# Patient Record
Sex: Male | Born: 1985 | Race: Black or African American | Hispanic: No | Marital: Single | Smoking: Never smoker
Health system: Southern US, Community
[De-identification: ages and names within clinical notes are randomized; demographics above are authoritative.]

## PROBLEM LIST (undated history)

## (undated) DIAGNOSIS — M6282 Rhabdomyolysis: Secondary | ICD-10-CM

## (undated) DIAGNOSIS — F23 Brief psychotic disorder: Secondary | ICD-10-CM

## (undated) DIAGNOSIS — F419 Anxiety disorder, unspecified: Secondary | ICD-10-CM

## (undated) HISTORY — DX: Rhabdomyolysis: M62.82

## (undated) HISTORY — DX: Brief psychotic disorder: F23

---

## 2013-01-10 ENCOUNTER — Ambulatory Visit: Payer: Self-pay | Admitting: Family Medicine

## 2015-09-05 ENCOUNTER — Ambulatory Visit (INDEPENDENT_AMBULATORY_CARE_PROVIDER_SITE_OTHER): Payer: Self-pay | Admitting: Family Medicine

## 2015-09-05 ENCOUNTER — Encounter: Payer: Self-pay | Admitting: Family Medicine

## 2015-09-05 VITALS — BP 140/82 | HR 73 | Temp 100.0°F | Resp 14 | Ht 60.0 in | Wt 170.0 lb

## 2015-09-05 DIAGNOSIS — R410 Disorientation, unspecified: Secondary | ICD-10-CM

## 2015-09-05 DIAGNOSIS — R509 Fever, unspecified: Secondary | ICD-10-CM

## 2015-09-05 DIAGNOSIS — B999 Unspecified infectious disease: Secondary | ICD-10-CM

## 2015-09-05 LAB — CBC WITH DIFFERENTIAL/PLATELET
BASOS ABS: 0 {cells}/uL (ref 0–200)
BASOS PCT: 0 %
EOS PCT: 0 %
Eosinophils Absolute: 0 cells/uL — ABNORMAL LOW (ref 15–500)
HCT: 37.4 % — ABNORMAL LOW (ref 38.5–50.0)
HEMOGLOBIN: 12.9 g/dL — AB (ref 13.2–17.1)
Lymphocytes Relative: 13 %
Lymphs Abs: 949 cells/uL (ref 850–3900)
MCH: 26.9 pg — AB (ref 27.0–33.0)
MCHC: 34.5 g/dL (ref 32.0–36.0)
MCV: 78.1 fL — AB (ref 80.0–100.0)
MONOS PCT: 23 %
MPV: 10.5 fL (ref 7.5–12.5)
Monocytes Absolute: 1679 cells/uL — ABNORMAL HIGH (ref 200–950)
NEUTROS PCT: 64 %
Neutro Abs: 4672 cells/uL (ref 1500–7800)
PLATELETS: 185 10*3/uL (ref 140–400)
RBC: 4.79 MIL/uL (ref 4.20–5.80)
RDW: 12.8 % (ref 11.0–15.0)
WBC: 7.3 10*3/uL (ref 3.8–10.8)

## 2015-09-05 MED ORDER — LORAZEPAM 0.5 MG PO TABS: 0.2500 mg | ORAL_TABLET | Freq: Four times a day (QID) | ORAL | Status: DC | PRN

## 2015-09-05 MED ORDER — CIPROFLOXACIN HCL 500 MG PO TABS: 500.0000 mg | ORAL_TABLET | Freq: Two times a day (BID) | ORAL | Status: DC

## 2015-09-05 NOTE — Patient Instructions (Addendum)
Start antibiotic and take one every twelve hours  Please do eat yogurt daily or take a probiotic daily for the next month or two We want to replace the healthy germs in the gut If you notice foul, watery diarrhea in the next two months, schedule an appointment RIGHT AWAY  We'll get a scan of the abdomen and pelvis tomorrow If you get worse overnight, back to the ER Use tylenol and/or ibuprofen if needed If any other issues, contact on-call doctor or seek treatment immediately

## 2015-09-05 NOTE — Progress Notes (Signed)
BP 140/82   Pulse 73   Temp 100 F (37.8 C) (Oral)   Resp 14   Ht 5' (1.524 m)   Wt 170 lb (77.1 kg)   SpO2 98%   BMI 33.20 kg/m   Temp 98.2 to 100 degrees  Subjective:    Patient ID: Louis Newton, male    DOB: [birth year], 30 y.o.   MRN: [id number]  HPI: Louis Newton is a 30 y.o. male  Chief Complaint  Patient presents with  . Follow-up    from fever states has not slept in 5 days   He was in his usual state of health until Saturday, worked in the heat, fever Sat night Went to [place] on Sunday; then to [facility] last Sunday night, gave him fluids and they sent him home Monday back down there, did spinal tap, pumped him full of fluids; took  Really bad anxiety, stressing Gave him tylenol and motrin at [facility] and that broke the fever Temperature was 101.7 last night, back to [facility] last night Body aches and chest pains No travel but stepfather just returned from [facility], stepfather, vomiting and fever; [place] No cough, no burning with urination; no trouble urinating Kind of numb down below, can't get an erection, just days; never happened before; no redness or swelling No change in BM We reviewed labs; CBC showed left shift; ketones in urine; blood and white cells in urine Father had kidney disease; mass on the kidney He has been hearing voices since he has been sick No hx of depression; no fam hx of bipolar disorder He has not slept for 4 nights, except for about 3 hours last night; when febrile, he thinks that he is Jesus; he has a white truck and keeps it clean; he can see the Bible; He is using me to do that; what is going on with Daisy Floro, things don't change, He's coming back He talked about unity and black and white No special powers He said that he could see girlfriend's mother talking to him, but he'd never met her He had neck stiffness the first night, but not since then  Depression screen PHQ 2/9 09/05/2015  Decreased Interest 0    Down, Depressed, Hopeless 1  PHQ - 2 Score 1   Relevant past medical, surgical, family and social history reviewed No past medical history on file.   No past surgical history on file.   Family History  Problem Relation Age of Onset  . Kidney disease Father   . Hypertension Daughter   . Epilepsy Daughter    Social History  Substance Use Topics  . Smoking status: Never Smoker  . Smokeless tobacco: Never Used  . Alcohol use 0.0 oz/week     Comment: today, 1 beer   Interim medical history since last visit reviewed. Allergies and medications reviewed  Review of Systems Per HPI unless specifically indicated above     Objective:    BP 140/82   Pulse 73   Temp 100 F (37.8 C) (Oral)   Resp 14   Ht 5' (1.524 m)   Wt 170 lb (77.1 kg)   SpO2 98%   BMI 33.20 kg/m     Physical Exam  Constitutional: He appears well-developed and well-nourished. No distress.  HENT:  Head: Normocephalic and atraumatic.  Eyes: EOM are normal. No scleral icterus.  Neck: No thyromegaly present.  Cardiovascular: Normal rate and regular rhythm.   Pulmonary/Chest: Effort normal  and breath sounds normal.  Abdominal: Soft. Bowel sounds are normal. He exhibits no distension. There is tenderness.  Musculoskeletal: He exhibits no edema.  Neurological: Coordination normal.  Skin: Skin is warm and dry. No pallor.  Psychiatric: He has a normal mood and affect. His behavior is normal. Judgment and thought content normal. His mood appears not anxious. His affect is not blunt, not labile and not inappropriate. His speech is not rapid and/or pressured, not tangential and not slurred. He is not agitated, not aggressive, not hyperactive, not slowed, not withdrawn and not actively hallucinating. Cognition and memory are not impaired. He does not express impulsivity or inappropriate judgment. He does not exhibit a depressed mood. He expresses no homicidal and no suicidal ideation.  Patient gives good history today       Assessment & Plan:   Problem List Items Addressed This Visit    None    Visit Diagnoses    Fever and chills    -  Primary   reviewed extensive work-up from outside tertiary care center; will get labs, CT scan; to ER if worsening   Relevant Orders   Urinalysis w microscopic + reflex cultur (Completed)   CBC with Differential/Platelet (Completed)   CT Abdomen Pelvis W Contrast   Confusion associated with infection       confusion with fever, sounds like delirium; no fam hx of depression, schizophrenia, bipolar d/o; will start antibiotics, get labs and CT; to ER if worse      Follow up plan: No Follow-up on file.  An after-visit summary was printed and given to the patient at [facility].  Please see the patient instructions which may contain other information and recommendations beyond what is mentioned above in the assessment and plan.  Orders Placed This Encounter  Procedures  . CT Abdomen Pelvis W Contrast  . Urinalysis w microscopic + reflex cultur  . CBC with Differential/Platelet  Face-to-face time with patient was more than 25 minutes, >50% time spent counseling and coordination of care

## 2015-09-06 ENCOUNTER — Inpatient Hospital Stay
Admission: EM | Admit: 2015-09-06 | Discharge: 2015-09-10 | DRG: 558 | Disposition: A | Discharge status: Other Acute Inpatient Hospital | Payer: Self-pay | Attending: Internal Medicine | Admitting: Internal Medicine

## 2015-09-06 ENCOUNTER — Encounter: Payer: Self-pay | Admitting: *Deleted

## 2015-09-06 DIAGNOSIS — F23 Brief psychotic disorder: Secondary | ICD-10-CM | POA: Diagnosis present

## 2015-09-06 DIAGNOSIS — E876 Hypokalemia: Secondary | ICD-10-CM | POA: Diagnosis present

## 2015-09-06 DIAGNOSIS — R45851 Suicidal ideations: Secondary | ICD-10-CM | POA: Diagnosis present

## 2015-09-06 DIAGNOSIS — M6282 Rhabdomyolysis: Principal | ICD-10-CM | POA: Diagnosis present

## 2015-09-06 DIAGNOSIS — Z8739 Personal history of other diseases of the musculoskeletal system and connective tissue: Secondary | ICD-10-CM | POA: Diagnosis present

## 2015-09-06 DIAGNOSIS — Z820 Family history of epilepsy and other diseases of the nervous system: Secondary | ICD-10-CM

## 2015-09-06 DIAGNOSIS — R7989 Other specified abnormal findings of blood chemistry: Secondary | ICD-10-CM

## 2015-09-06 DIAGNOSIS — R778 Other specified abnormalities of plasma proteins: Secondary | ICD-10-CM

## 2015-09-06 DIAGNOSIS — Z841 Family history of disorders of kidney and ureter: Secondary | ICD-10-CM

## 2015-09-06 DIAGNOSIS — Z046 Encounter for general psychiatric examination, requested by authority: Secondary | ICD-10-CM

## 2015-09-06 DIAGNOSIS — F329 Major depressive disorder, single episode, unspecified: Secondary | ICD-10-CM

## 2015-09-06 DIAGNOSIS — F32A Depression, unspecified: Secondary | ICD-10-CM

## 2015-09-06 DIAGNOSIS — Z79899 Other long term (current) drug therapy: Secondary | ICD-10-CM

## 2015-09-06 DIAGNOSIS — N179 Acute kidney failure, unspecified: Secondary | ICD-10-CM | POA: Diagnosis present

## 2015-09-06 DIAGNOSIS — N390 Urinary tract infection, site not specified: Secondary | ICD-10-CM | POA: Diagnosis present

## 2015-09-06 DIAGNOSIS — Z8249 Family history of ischemic heart disease and other diseases of the circulatory system: Secondary | ICD-10-CM

## 2015-09-06 LAB — COMPREHENSIVE METABOLIC PANEL
ALK PHOS: 67 U/L (ref 38–126)
ALT: 62 U/L (ref 17–63)
AST: 135 U/L — ABNORMAL HIGH (ref 15–41)
Albumin: 4 g/dL (ref 3.5–5.0)
Anion gap: 11 (ref 5–15)
BILIRUBIN TOTAL: 0.8 mg/dL (ref 0.3–1.2)
BUN: 12 mg/dL (ref 6–20)
CALCIUM: 9.8 mg/dL (ref 8.9–10.3)
CHLORIDE: 102 mmol/L (ref 101–111)
CO2: 28 mmol/L (ref 22–32)
CREATININE: 1.28 mg/dL — AB (ref 0.61–1.24)
Glucose, Bld: 110 mg/dL — ABNORMAL HIGH (ref 65–99)
Potassium: 3.5 mmol/L (ref 3.5–5.1)
Sodium: 141 mmol/L (ref 135–145)
TOTAL PROTEIN: 8.3 g/dL — AB (ref 6.5–8.1)

## 2015-09-06 LAB — URINALYSIS W MICROSCOPIC + REFLEX CULTURE
BACTERIA UA: NONE SEEN [HPF]
BILIRUBIN URINE: NEGATIVE
Casts: NONE SEEN [LPF]
Crystals: NONE SEEN [HPF]
GLUCOSE, UA: NEGATIVE
Ketones, ur: NEGATIVE
LEUKOCYTES UA: NEGATIVE
Nitrite: NEGATIVE
SQUAMOUS EPITHELIAL / LPF: NONE SEEN [HPF] (ref <=5)
Specific Gravity, Urine: 1.01 (ref 1.001–1.035)
WBC UA: NONE SEEN WBC/HPF (ref <=5)
Yeast: NONE SEEN [HPF]
pH: 7.5 (ref 5.0–8.0)

## 2015-09-06 LAB — CBC
HCT: 41.7 % (ref 40.0–52.0)
Hemoglobin: 14.5 g/dL (ref 13.0–18.0)
MCH: 27.8 pg (ref 26.0–34.0)
MCHC: 34.8 g/dL (ref 32.0–36.0)
MCV: 79.9 fL — AB (ref 80.0–100.0)
PLATELETS: 200 10*3/uL (ref 150–440)
RBC: 5.21 MIL/uL (ref 4.40–5.90)
RDW: 12.5 % (ref 11.5–14.5)
WBC: 11.5 10*3/uL — ABNORMAL HIGH (ref 3.8–10.6)

## 2015-09-06 LAB — SALICYLATE LEVEL

## 2015-09-06 LAB — ETHANOL

## 2015-09-06 LAB — ACETAMINOPHEN LEVEL: Acetaminophen (Tylenol), Serum: <10 ug/mL — ABNORMAL LOW (ref 10–30)

## 2015-09-06 NOTE — ED Notes (Signed)
Pt is very agitated physically, constantly moving. Pt presents w/ [place] police under IVC from family. Pt denies SI/HI/self-harm. Pt acknowledges that he made statements indicative of SI, denies plan at this time. Pt admits to ETOH use today, denies drug use.

## 2015-09-07 DIAGNOSIS — Z046 Encounter for general psychiatric examination, requested by authority: Secondary | ICD-10-CM

## 2015-09-07 DIAGNOSIS — F23 Brief psychotic disorder: Secondary | ICD-10-CM

## 2015-09-07 DIAGNOSIS — Z8739 Personal history of other diseases of the musculoskeletal system and connective tissue: Secondary | ICD-10-CM | POA: Diagnosis present

## 2015-09-07 DIAGNOSIS — M6282 Rhabdomyolysis: Secondary | ICD-10-CM

## 2015-09-07 HISTORY — DX: Rhabdomyolysis: M62.82

## 2015-09-07 HISTORY — DX: Encounter for general psychiatric examination, requested by authority: Z04.6

## 2015-09-07 HISTORY — DX: Brief psychotic disorder: F23

## 2015-09-07 HISTORY — DX: Personal history of other diseases of the musculoskeletal system and connective tissue: Z87.39

## 2015-09-07 LAB — PHOSPHORUS: PHOSPHORUS: 3 mg/dL (ref 2.5–4.6)

## 2015-09-07 LAB — URINE DRUG SCREEN, QUALITATIVE (ARMC ONLY)
AMPHETAMINES, UR SCREEN: NOT DETECTED
Barbiturates, Ur Screen: NOT DETECTED
Benzodiazepine, Ur Scrn: NOT DETECTED
COCAINE METABOLITE, UR ~~LOC~~: NOT DETECTED
Cannabinoid 50 Ng, Ur ~~LOC~~: NOT DETECTED
MDMA (ECSTASY) UR SCREEN: NOT DETECTED
METHADONE SCREEN, URINE: NOT DETECTED
OPIATE, UR SCREEN: NOT DETECTED
PHENCYCLIDINE (PCP) UR S: NOT DETECTED
Tricyclic, Ur Screen: NOT DETECTED

## 2015-09-07 LAB — URINALYSIS COMPLETE WITH MICROSCOPIC (ARMC ONLY)
Bilirubin Urine: NEGATIVE
Glucose, UA: NEGATIVE mg/dL
LEUKOCYTES UA: NEGATIVE
NITRITE: NEGATIVE
PROTEIN: 100 mg/dL — AB
SPECIFIC GRAVITY, URINE: 1.03 (ref 1.005–1.030)
Squamous Epithelial / LPF: NONE SEEN
pH: 6 (ref 5.0–8.0)

## 2015-09-07 LAB — CK: Total CK: 8365 U/L — ABNORMAL HIGH (ref 49–397)

## 2015-09-07 LAB — TROPONIN I: TROPONIN I: 0.39 ng/mL — AB (ref <0.03)

## 2015-09-07 LAB — MONONUCLEOSIS SCREEN: Mono Screen: NEGATIVE

## 2015-09-07 LAB — MAGNESIUM: Magnesium: 1.8 mg/dL (ref 1.7–2.4)

## 2015-09-07 MED ORDER — ALBUTEROL SULFATE (2.5 MG/3ML) 0.083% IN NEBU: 2.5000 mg | INHALATION_SOLUTION | RESPIRATORY_TRACT | Status: DC | PRN

## 2015-09-07 MED ORDER — ENOXAPARIN SODIUM 40 MG/0.4ML ~~LOC~~ SOLN
40.0000 mg | SUBCUTANEOUS | Status: DC
  Administered 2015-09-07 – 2015-09-10 (×4): 40 mg via SUBCUTANEOUS
  Filled 2015-09-07 (×4): qty 0.4

## 2015-09-07 MED ORDER — LORAZEPAM 0.5 MG PO TABS
0.2500 mg | ORAL_TABLET | Freq: Four times a day (QID) | ORAL | Status: DC | PRN
  Administered 2015-09-07 – 2015-09-09 (×3): 0.5 mg via ORAL
  Filled 2015-09-07 (×3): qty 1

## 2015-09-07 MED ORDER — ONDANSETRON HCL 4 MG/2ML IJ SOLN: 4.0000 mg | Freq: Four times a day (QID) | INTRAMUSCULAR | Status: DC | PRN

## 2015-09-07 MED ORDER — MORPHINE SULFATE (PF) 4 MG/ML IV SOLN: 4.0000 mg | INTRAVENOUS | Status: DC | PRN

## 2015-09-07 MED ORDER — HYDROCODONE-ACETAMINOPHEN 5-325 MG PO TABS
1.0000 | ORAL_TABLET | ORAL | Status: DC | PRN
  Administered 2015-09-07 – 2015-09-08 (×3): 2 via ORAL
  Filled 2015-09-07 (×3): qty 2

## 2015-09-07 MED ORDER — ACETAMINOPHEN 325 MG PO TABS: 650.0000 mg | ORAL_TABLET | Freq: Four times a day (QID) | ORAL | Status: DC | PRN

## 2015-09-07 MED ORDER — ACETAMINOPHEN 650 MG RE SUPP: 650.0000 mg | Freq: Four times a day (QID) | RECTAL | Status: DC | PRN

## 2015-09-07 MED ORDER — ONDANSETRON HCL 4 MG PO TABS: 4.0000 mg | ORAL_TABLET | Freq: Four times a day (QID) | ORAL | Status: DC | PRN

## 2015-09-07 MED ORDER — SODIUM CHLORIDE 0.9 % IV SOLN
INTRAVENOUS | Status: DC
  Administered 2015-09-07 – 2015-09-10 (×10): via INTRAVENOUS

## 2015-09-07 MED ORDER — CIPROFLOXACIN HCL 500 MG PO TABS
500.0000 mg | ORAL_TABLET | Freq: Two times a day (BID) | ORAL | Status: DC
  Administered 2015-09-07 – 2015-09-10 (×7): 500 mg via ORAL
  Filled 2015-09-07 (×7): qty 1

## 2015-09-07 MED ORDER — SODIUM CHLORIDE 0.9 % IV BOLUS (SEPSIS)
1000.0000 mL | Freq: Once | INTRAVENOUS | Status: AC
  Administered 2015-09-07: 1000 mL via INTRAVENOUS

## 2015-09-07 MED ORDER — QUETIAPINE FUMARATE 100 MG PO TABS
100.0000 mg | ORAL_TABLET | Freq: Every day | ORAL | Status: DC
  Administered 2015-09-07: 100 mg via ORAL
  Filled 2015-09-07: qty 1

## 2015-09-07 NOTE — ED Notes (Signed)
Spoke to mother, Leslie DalesLaverne Cousins, cell [phone], and pt's girlfriend, both reports hx over last 6 days of fever 102.5 [facility], spinal fluid collection and being unable to achieve an erection since, and pt have hyperreligious thoughts "I'm God", mind and thoughts racing, reports of pt seeing girlfriends dead mother.  Denies regular ETOH use, no drug use, pt has no behavioral or medical history.    Brother, DeltaJustin Borys, cell [phone], [place] [employer] [facility]

## 2015-09-07 NOTE — ED Notes (Signed)
[name] on 1C aware of Louis Newton's IVC status and need for sitter. Louis Newton A+Ox4, calm and cooperative at this time. Louis Newton preparing for transfer

## 2015-09-07 NOTE — H&P (Addendum)
[facility] [place] [facility]   PATIENT NAME: Louis Newton Staff    MR#:  [id number]  DATE OF BIRTH:  16-Oct-1985  DATE OF ADMISSION:  09/06/2015  PRIMARY CARE PHYSICIAN: Baruch GoutyMelinda Lada, MD   REQUESTING/REFERRING PHYSICIAN: Dr. Wynelle LinkSun.  CHIEF COMPLAINT:   Chief Complaint  Patient presents with  . Psychiatric Evaluation    HISTORY OF PRESENT ILLNESS:  Louis Newton Kozuch  is a 30 y.o. male with no medical history. Per Dr. Wynelle LinkSun, [place] [facility] [facility] [place] [facility]. Patient had intermittent fevers over the past week. [facility] for fever with a negative workup including LP, HIV and viral panels. He was seen by his PCP yesterday and expressed suicidal ideation, anxiety, grandiosity, hyperreligiosity. He was placed under involuntary commitment by his family for these reasons. He apparently told the triage nurse that he was placed on Cipro yesterday by his PCP for UTI. Patient denies any symptoms. He does not know why he was brought here. He remember he run into woods.  His mother said he was lost for several hours. He was found high CK more than 8000.   PAST MEDICAL HISTORY:  No past medical history on file. No.  PAST SURGICAL HISTORY:  No past surgical history on file. No.  SOCIAL HISTORY:   Social History  Substance Use Topics  . Smoking status: Never Smoker   . Smokeless tobacco: Never Used  . Alcohol Use: 0.0 oz/week    0 Standard drinks or equivalent per week     Comment: today, 1 beer    FAMILY HISTORY:   Family History  Problem Relation Age of Onset  . Kidney disease Father   . Hypertension Daughter   . Epilepsy Daughter     DRUG ALLERGIES:  No Known Allergies  REVIEW OF SYSTEMS:   Review of Systems  Constitutional: Negative for fever, chills and malaise/fatigue.  Eyes: Negative for blurred vision and double vision.  Respiratory: Negative for cough, shortness of breath and wheezing.     Cardiovascular: Negative for chest pain, palpitations, orthopnea and leg swelling.  Gastrointestinal: Negative for nausea, vomiting and abdominal pain.  Genitourinary: Negative for dysuria, urgency and frequency.  Musculoskeletal: Negative for back pain and joint pain.  Neurological: Negative for dizziness, tingling, tremors, speech change, focal weakness, seizures, loss of consciousness and headaches.  Psychiatric/Behavioral: Negative for depression. The patient is not nervous/anxious.     MEDICATIONS AT HOME:   Prior to Admission medications   Medication Sig Start Date End Date Taking? Authorizing Provider  ciprofloxacin (CIPRO) 500 MG tablet Take 1 tablet (500 mg total) by mouth 2 (two) times daily. 09/05/15  Yes Kerman PasseyMelinda P Lada, MD  LORazepam (ATIVAN) 0.5 MG tablet Take 0.5-1 tablets (0.25-0.5 mg total) by mouth every 6 (six) hours as needed for anxiety. 09/05/15  Yes Kerman PasseyMelinda P Lada, MD      VITAL SIGNS:  Blood pressure 147/79, pulse 82, temperature 99.9 F (37.7 C), temperature source Oral, resp. rate 17, height 5\' 10"  (1.778 m), weight 170 lb (77.111 kg), SpO2 98 %.  PHYSICAL EXAMINATION:  Physical Exam  GENERAL:  30 y.o.-year-old patient lying in the bed with no acute distress.  EYES: Pupils equal, round, reactive to light and accommodation. No scleral icterus. Extraocular muscles intact.  HEENT: Head atraumatic, normocephalic. Oropharynx and nasopharynx clear.  NECK:  Supple, no jugular venous distention. No thyroid enlargement, no tenderness.  LUNGS: Normal breath sounds bilaterally, no  wheezing, rales,rhonchi or crepitation. No use of accessory muscles of respiration.  CARDIOVASCULAR: S1, S2 normal. No murmurs, rubs, or gallops.  ABDOMEN: Soft, nontender, nondistended. Bowel sounds present. No organomegaly or mass.  EXTREMITIES: No pedal edema, cyanosis, or clubbing.  NEUROLOGIC: Cranial nerves II through XII are intact. Muscle strength 5/5 in all extremities. Sensation  intact. Gait not checked.  PSYCHIATRIC: The patient is alert and oriented x 3.  SKIN: No obvious rash, lesion, or ulcer.   LABORATORY PANEL:   CBC  Recent Labs Lab 09/06/15 2304  WBC 11.5*  HGB 14.5  HCT 41.7  PLT 200   ------------------------------------------------------------------------------------------------------------------  Chemistries   Recent Labs Lab 09/06/15 2304  NA 141  K 3.5  CL 102  CO2 28  GLUCOSE 110*  BUN 12  CREATININE 1.28*  CALCIUM 9.8  AST 135*  ALT 62  ALKPHOS 67  BILITOT 0.8   ------------------------------------------------------------------------------------------------------------------  Cardiac Enzymes  Recent Labs Lab 09/06/15 2304  TROPONINI 0.39*   ------------------------------------------------------------------------------------------------------------------  RADIOLOGY:  No results found.    IMPRESSION AND PLAN:   Rhabdomyolysis. Continue NS iv, follow up CK and BMP.  ARF. Continue IVF and f/u BMP.  Psychosis. Follow up psych consult. Ativan prn and sitter.  UTI. Continue cipro, follow up urine culture.    All the records are reviewed and case discussed with ED provider. Management plans discussed with the patient, his mother and they are in agreement.  CODE STATUS: full code.  TOTAL TIME TAKING CARE OF THIS PATIENT: 48 minutes.    [name] M.D on 09/07/2015 at 9:01 AM  Between 7am to 6pm - Pager - [phone]  After 6pm go to [email or web address] - Social research officer, government  [facility]  Office  [phone]  CC: Primary care physician; Baruch Gouty, MD   Note: This dictation was prepared with Dragon dictation along with smaller phrase technology. Any transcriptional errors that result from this process are unintentional.

## 2015-09-07 NOTE — Consult Note (Signed)
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 Rhabdomyolysis [M62.82] 09/07/2015  . 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 Involuntary commitment [Z04.6] 09/07/2015    Total Time spent with patient: 1 hour  Subjective:   Louis Newton is a 30 y.o. male patient admitted with "I've been seeing a bunch of stuff".  HPI:  Patient interviewed. His mother was present also which was very helpful. Past chart including [facility] Notes reviewed. Labs and vitals reviewed. This 30 year old man was brought to the emergency room yesterday under IVC. 911 had been called by the family because the patient disappeared and was gone for several hours and they were worried about him. Patient was hyperverbal hyper religious and appeared to be confused when they found him. He is a pretty good historian today. He and his mother tell the same story. About a week ago the patient started feeling feverish. He denies being aware of any other specific physical symptoms. He went to [facility] emergency room 3 times over several days and had a workup that included the usual screens for infection as well as a spinal tap and viral panels. No diagnosis was found. At that time he was already complaining that he had not slept for a few days. At this point the patient says he is now not slept for probably nearly a week. If so lonely a few hours. He reports that he has been having visual hallucinations. He says that he has seen images of biblical scenes playing out in front of him. He also has seen visions of dead relatives and friends. The most remarkable  thing seems to be a new onset of extremely hyper religious thinking. Patient talks nonstop about the Bible. His knowledge of the Bible is limited and confused. He tends to repeat himself and to tie things together in ways that don't make sense. He appears to be implying that he is receiving some kind of religious message. Talks a lot about how he is getting the word of the Bible out to the world. Patient says that during the time that he had disappeared yesterday when his family was worried about him he had been running through the woods. He says at one point he had climbed into a trash can. Patient says his mood has been feeling nervous and worried but also upbeat. Throughout this time however he does not really report other specific localizing physical symptoms. Patient absolutely denies any drug abuse. Denies any use of marijuana cocaine hallucinogens unusual street drugs or even alcohol. Mother confirms that he is not known to have any kind of substance use problem. Patient was not under any psychiatric treatment and does not have any prior psychiatric diagnosis. We talked for a while trying to identify any kind of recent psychosocial trauma or stress and there did not appear to be any.  Social history: Patient lives with his mother and stepfather. He is not currently working. This is been going on for months. It does seem to be a little stressful for him. He has a girlfriend who has a child. He expresses a lot of worry and anxiety about that but nothing specific.  Medical history: Presented with elevated creatinine kinase and some degree of rhabdomyolysis. Most likely related to the physical exertion and fevers.  Substance abuse history: I look back through his chart his notes and spoke with the patient and his mother and really could not identify any evidence of any kind of substance use problem.  Past Psychiatric History: There is no past psychiatric history. No history of psychiatric hospitalization,  no history of suicide attempts, no history of prior psychotic episodes. Never been prescribed any psychiatric medicine in the past.  Risk to Self: Is patient at risk for suicide?: No Risk to Others:   Prior Inpatient Therapy:   Prior Outpatient Therapy:    Past Medical History: History reviewed. No pertinent past medical history. History reviewed. No pertinent past surgical history. Family History:  Family History  Problem Relation Age of Onset  . Kidney disease Father   . Hypertension Daughter   . Epilepsy Daughter    Family Psychiatric  History: There was mention that his biological father had "been through the same sort of thing" but when I pressed for details my impression is more that they were talking about the rhabdomyolysis. There is really no known specific history of mental health problems in the family Social History:  History  Alcohol Use  . 0.0 oz/week  . 0 Standard drinks or equivalent per week    Comment: today, 1 beer     History  Drug Use No    Social History   Social History  . Marital Status: Single    Spouse Name: N/A  . Number of Children: N/A  . Years of Education: N/A   Social History Main Topics  . Smoking status: Never Smoker   . Smokeless tobacco: Never Used  . Alcohol Use: 0.0 oz/week    0 Standard drinks or equivalent per week     Comment: today, 1 beer  . Drug Use: No  . Sexual Activity: Yes    Birth Control/ Protection: None   Other Topics Concern  . None   Social History Narrative   Additional Social History:    Allergies:   Allergies  Allergen Reactions  . Bee Venom Swelling    Labs:  Results for orders placed or performed during the hospital encounter of 09/06/15 (from the past 48 hour(s))  Comprehensive metabolic panel     Status: Abnormal   Collection Time: 09/06/15 11:04 PM  Result Value Ref Range   Sodium 141 135 - 145 mmol/L   Potassium 3.5 3.5 - 5.1 mmol/L   Chloride 102 101 - 111 mmol/L   CO2 28 22 - 32 mmol/L    Glucose, Bld 110 (H) 65 - 99 mg/dL   BUN 12 6 - 20 mg/dL   Creatinine, Ser 1.28 (H) 0.61 - 1.24 mg/dL   Calcium 9.8 8.9 - 10.3 mg/dL   Total Protein 8.3 (H) 6.5 - 8.1 g/dL   Albumin 4.0 3.5 - 5.0 g/dL   AST 135 (H) 15 - 41 U/L   ALT 62 17 - 63 U/L   Alkaline Phosphatase 67 38 - 126 U/L   Total Bilirubin 0.8 0.3 - 1.2 mg/dL   GFR calc non Af Amer >60 >60 mL/min   GFR calc Af Amer >60 >60 mL/min    Comment: (NOTE) The eGFR has been calculated using the CKD EPI equation. This calculation has not been validated in all clinical situations. eGFR's persistently <60 mL/min signify possible Chronic Kidney Disease.    Anion gap 11  5 - 15  Ethanol     Status: None   Collection Time: 09/06/15 11:04 PM  Result Value Ref Range   Alcohol, Ethyl (B) <5 <5 mg/dL    Comment:        LOWEST DETECTABLE LIMIT FOR SERUM ALCOHOL IS 5 mg/dL FOR MEDICAL PURPOSES ONLY   Salicylate level     Status: None   Collection Time: 09/06/15 11:04 PM  Result Value Ref Range   Salicylate Lvl <0.3 2.8 - 30.0 mg/dL  Acetaminophen level     Status: Abnormal   Collection Time: 09/06/15 11:04 PM  Result Value Ref Range   Acetaminophen (Tylenol), Serum <10 (L) 10 - 30 ug/mL    Comment:        THERAPEUTIC CONCENTRATIONS VARY SIGNIFICANTLY. A RANGE OF 10-30 ug/mL MAY BE AN EFFECTIVE CONCENTRATION FOR MANY PATIENTS. HOWEVER, SOME ARE BEST TREATED AT CONCENTRATIONS OUTSIDE THIS RANGE. ACETAMINOPHEN CONCENTRATIONS >150 ug/mL AT 4 HOURS AFTER INGESTION AND >50 ug/mL AT 12 HOURS AFTER INGESTION ARE OFTEN ASSOCIATED WITH TOXIC REACTIONS.   cbc     Status: Abnormal   Collection Time: 09/06/15 11:04 PM  Result Value Ref Range   WBC 11.5 (H) 3.8 - 10.6 K/uL   RBC 5.21 4.40 - 5.90 MIL/uL   Hemoglobin 14.5 13.0 - 18.0 g/dL   HCT 41.7 40.0 - 52.0 %   MCV 79.9 (L) 80.0 - 100.0 fL   MCH 27.8 26.0 - 34.0 pg   MCHC 34.8 32.0 - 36.0 g/dL   RDW 12.5 11.5 - 14.5 %   Platelets 200 150 - 440 K/uL  Mononucleosis screen      Status: None   Collection Time: 09/06/15 11:04 PM  Result Value Ref Range   Mono Screen NEGATIVE NEGATIVE  CK     Status: Abnormal   Collection Time: 09/06/15 11:04 PM  Result Value Ref Range   Total CK 8365 (H) 49 - 397 U/L    Comment: RESULT CONFIRMED BY MANUAL DILUTION  Troponin I     Status: Abnormal   Collection Time: 09/06/15 11:04 PM  Result Value Ref Range   Troponin I 0.39 (HH) <0.03 ng/mL    Comment: CRITICAL RESULT CALLED TO, READ BACK BY AND VERIFIED WITH BRYAN GRAY AT 5465 09/07/15.PMH  Urine Drug Screen, Qualitative     Status: None   Collection Time: 09/06/15 11:56 PM  Result Value Ref Range   Tricyclic, Ur Screen NONE DETECTED NONE DETECTED   Amphetamines, Ur Screen NONE DETECTED NONE DETECTED   MDMA (Ecstasy)Ur Screen NONE DETECTED NONE DETECTED   Cocaine Metabolite,Ur [place] NONE DETECTED NONE DETECTED   Opiate, Ur Screen NONE DETECTED NONE DETECTED   Phencyclidine (PCP) Ur S NONE DETECTED NONE DETECTED   Cannabinoid 50 Ng, Ur [place] NONE DETECTED NONE DETECTED   Barbiturates, Ur Screen NONE DETECTED NONE DETECTED   Benzodiazepine, Ur Scrn NONE DETECTED NONE DETECTED   Methadone Scn, Ur NONE DETECTED NONE DETECTED    Comment: (NOTE) 681  Tricyclics, urine               Cutoff 1000 ng/mL 200  Amphetamines, urine             Cutoff 1000 ng/mL 300  MDMA (Ecstasy), urine           Cutoff 500 ng/mL 400  Cocaine Metabolite, urine       Cutoff 300 ng/mL 500  Opiate, urine  Cutoff 300 ng/mL 600  Phencyclidine (PCP), urine      Cutoff 25 ng/mL 700  Cannabinoid, urine              Cutoff 50 ng/mL 800  Barbiturates, urine             Cutoff 200 ng/mL 900  Benzodiazepine, urine           Cutoff 200 ng/mL 1000 Methadone, urine                Cutoff 300 ng/mL 1100 1200 The urine drug screen provides only a preliminary, unconfirmed 1300 analytical test result and should not be used for non-medical 1400 purposes. Clinical consideration and professional judgment  should 1500 be applied to any positive drug screen result due to possible 1600 interfering substances. A more specific alternate chemical method 1700 must be used in order to obtain a confirmed analytical result.  1800 Gas chromato graphy / mass spectrometry (GC/MS) is the preferred 1900 confirmatory method.   Urinalysis complete, with microscopic ([facility] only)     Status: Abnormal   Collection Time: 09/06/15 11:56 PM  Result Value Ref Range   Color, Urine YELLOW (A) YELLOW   APPearance TURBID (A) CLEAR   Glucose, UA NEGATIVE NEGATIVE mg/dL   Bilirubin Urine NEGATIVE NEGATIVE   Ketones, ur 1+ (A) NEGATIVE mg/dL   Specific Gravity, Urine 1.030 1.005 - 1.030   Hgb urine dipstick 3+ (A) NEGATIVE   pH 6.0 5.0 - 8.0   Protein, ur 100 (A) NEGATIVE mg/dL   Nitrite NEGATIVE NEGATIVE   Leukocytes, UA NEGATIVE NEGATIVE   RBC / HPF 0-5 0 - 5 RBC/hpf   WBC, UA 6-30 0 - 5 WBC/hpf   Bacteria, UA RARE (A) NONE SEEN   Squamous Epithelial / LPF NONE SEEN NONE SEEN   Mucous PRESENT   Magnesium     Status: None   Collection Time: 09/07/15  9:23 AM  Result Value Ref Range   Magnesium 1.8 1.7 - 2.4 mg/dL  Phosphorus     Status: None   Collection Time: 09/07/15  9:23 AM  Result Value Ref Range   Phosphorus 3.0 2.5 - 4.6 mg/dL    Current Facility-Administered Medications  Medication Dose Route Frequency Provider Last Rate Last Dose  . 0.9 %  sodium chloride infusion   Intravenous Continuous Demetrios Loll, MD 150 mL/hr at 09/07/15 1623    . acetaminophen (TYLENOL) tablet 650 mg  650 mg Oral Q6H PRN Demetrios Loll, MD       Or  . acetaminophen (TYLENOL) suppository 650 mg  650 mg Rectal Q6H PRN Demetrios Loll, MD      . albuterol (PROVENTIL) (2.5 MG/3ML) 0.083% nebulizer solution 2.5 mg  2.5 mg Nebulization Q2H PRN Demetrios Loll, MD      . ciprofloxacin (CIPRO) tablet 500 mg  500 mg Oral BID Demetrios Loll, MD   500 mg at 09/07/15 2141  . enoxaparin (LOVENOX) injection 40 mg  40 mg Subcutaneous Q24H Demetrios Loll, MD   40 mg  at 09/07/15 0948  . HYDROcodone-acetaminophen (NORCO/VICODIN) 5-325 MG per tablet 1-2 tablet  1-2 tablet Oral Q4H PRN Demetrios Loll, MD   2 tablet at 09/07/15 1623  . LORazepam (ATIVAN) tablet 0.25-0.5 mg  0.25-0.5 mg Oral Q6H PRN Demetrios Loll, MD   0.5 mg at 09/07/15 1954  . morphine 4 MG/ML injection 4 mg  4 mg Intravenous Q4H PRN Demetrios Loll, MD      . ondansetron [facility]) tablet  4 mg  4 mg Oral Q6H PRN Demetrios Loll, MD       Or  . ondansetron [facility]) injection 4 mg  4 mg Intravenous Q6H PRN Demetrios Loll, MD      . QUEtiapine (SEROQUEL) tablet 100 mg  100 mg Oral QHS Gonzella Lex, MD        Musculoskeletal: Strength & Muscle Tone: within normal limits Gait & Station: normal Patient leans: N/A  Psychiatric Specialty Exam: Physical Exam  Nursing note and vitals reviewed. Constitutional: He appears well-developed and well-nourished.  HENT:  Head: Normocephalic and atraumatic.  Eyes: Conjunctivae are normal. Pupils are equal, round, and reactive to light.  Neck: Normal range of motion.  Cardiovascular: Regular rhythm and normal heart sounds.   Respiratory: Effort normal. No respiratory distress.  GI: Soft.  Musculoskeletal: Normal range of motion.  Neurological: He is alert.  Skin: Skin is warm and dry.  Psychiatric: His speech is normal and behavior is normal. His affect is labile. Thought content is delusional.  Patient presents as hyper religious and somewhat grandiose and odd in his thinking but is not delirious and I wouldn't really call him disorganized.    Review of Systems  Constitutional: Negative.   HENT: Negative.   Eyes: Negative.   Respiratory: Negative.   Cardiovascular: Negative.   Gastrointestinal: Negative.   Musculoskeletal: Negative.   Skin: Negative.   Neurological: Negative.   Psychiatric/Behavioral: Positive for hallucinations and memory loss. Negative for depression, suicidal ideas and substance abuse. The patient is nervous/anxious and has insomnia.     Blood  pressure 156/85, pulse 75, temperature 98.5 F (36.9 C), temperature source Oral, resp. rate 18, height 5' 10"  (1.778 m), weight 77.111 kg (170 lb), SpO2 98 %.Body mass index is 24.39 kg/(m^2).  General Appearance: Well Groomed  Eye Contact:  Good  Speech:  Clear and Coherent and Pressured  Volume:  Increased  Mood:  Anxious and Euphoric  Affect:  Congruent  Thought Process:  Disorganized and Descriptions of Associations: Tangential  Orientation:  Full (Time, Place, and Person)  Thought Content:  Illogical and Hyper religious focus  Suicidal Thoughts:  No  Homicidal Thoughts:  No  Memory:  Immediate;   Good Recent;   Fair Remote;   Poor  Judgement:  Fair  Insight:  Shallow  Psychomotor Activity:  Restlessness  Concentration:  Concentration: Fair  Recall:  [employer] of Knowledge:  Fair  Language:  Fair  Akathisia:  No  Handed:  Right  AIMS (if indicated):     Assets:  Communication Skills Desire for Improvement Housing Physical Health Resilience Social Support  ADL's:  Intact  Cognition:  WNL  Sleep:        Treatment Plan Summary: Daily contact with patient to assess and evaluate symptoms and progress in treatment, Medication management and Plan 30 year old man who presents with a week of altered mental status. Because of the complaints about fever and the lack of prior psychiatric illness a neurologic workup has been pursued which is very appropriate. To my evaluation today however the patient's whole symptom and coarse sound most typically like a manic episode. On interview today he is certainly not delirious. He is completely oriented and able to hold a sustained lucid conversation. What he uses hyper religious and has not slept in days. There is no evidence that this is substance-induced. It is a little unusual for someone his age to present with a first manic episode without any prior psychiatric history at all  but not impossible. Pending further information I'm going to  give him a diagnosis of brief reactive psychosis. Really hard to know whether this is bipolar disorder until things play out longer. I am going to start him on Seroquel 100 mg at night tonight which should help him sleep. Getting a decent night sleep or so often helps to break people out of these acute manic syndromes. I will continue to follow-up and will see him tomorrow. I'm not yet going to discontinue the IVC because of the risk that he might become unpredictable although I doubt that he is an acute danger.  Disposition: Supportive therapy provided about ongoing stressors. Discussed crisis plan, support from social network, calling 911, coming to the Emergency Department, and calling Suicide Hotline.  Alethia Berthold, MD 09/07/2015 9:50 PM

## 2015-09-07 NOTE — ED Notes (Signed)
Lab called for add on 

## 2015-09-07 NOTE — Progress Notes (Signed)
Took seroquel into patients room, patient very agitated states that he doesn't think he should take this medication. I explained that this medication is to help him rest, he seems not to trust us or the docotors. His mother came back into the room and convinced him to try it he was very hesitant and worked himself up into a panic, he finally camled enough to take the serouel.

## 2015-09-07 NOTE — ED Provider Notes (Signed)
[facility] Emergency Department Provider Note   ____________________________________________  Time seen: Approximately 3:29 AM  I have reviewed the triage vital signs and the nursing notes.   HISTORY  Chief Complaint Psychiatric Evaluation  Limited by somnolence  HPI Louis Newton is a 30 y.o. male who presents to the ED [facility]C [place] [facility] [employer] [facility]. Patientis an otherwise healthy male who has had intermittent fevers over the past week. [facility] times for fever with a negative workup including LP, HIV and viral panels. He was seen by his PCP yesterday and expressed suicidal ideation, anxiety, grandiosity, hyperreligiosity. He was placed under involuntary commitment by his family for these reasons. Rest of history is limited secondary to patient sleeping and uncooperative for exam. Majority of history obtained via notes in Epic. He apparently told the triage nurse that he was placed on Cipro yesterday by his PCP for UTI.   Past medical history None  There are no active problems to display for this patient.   No past surgical history on file.  Current Outpatient Rx  Name  Route  Sig  Dispense  Refill  . ciprofloxacin (CIPRO) 500 MG tablet   Oral   Take 1 tablet (500 mg total) by mouth 2 (two) times daily.   20 tablet   0   . LORazepam (ATIVAN) 0.5 MG tablet   Oral   Take 0.5-1 tablets (0.25-0.5 mg total) by mouth every 6 (six) hours as needed for anxiety.   10 tablet   0     Allergies Review of patient's allergies indicates no known allergies.  Family History  Problem Relation Age of Onset  . Kidney disease Father   . Hypertension Daughter   . Epilepsy Daughter     Social History Social History  Substance Use Topics  . Smoking status: Never Smoker   . Smokeless tobacco: Never Used  . Alcohol Use: 0.0 oz/week    0 Standard drinks or equivalent per week     Comment: today, 1 beer    Review  of Systems  Constitutional: Positive for fever/chills. Eyes: No visual changes. ENT: No sore throat. Cardiovascular: Denies chest pain. Respiratory: Denies shortness of breath. Gastrointestinal: No abdominal pain.  No nausea, no vomiting.  No diarrhea.  No constipation. Genitourinary: Negative for dysuria. Musculoskeletal: Negative for back pain. Skin: Negative for rash. Neurological: Negative for headaches, focal weakness or numbness. Psychiatric:Positive for anxiety, suicidal thoughts.  10-point ROS otherwise negative.  ____________________________________________   PHYSICAL EXAM:  VITAL SIGNS: ED Triage Vitals  Enc Vitals Group     BP 09/06/15 2254 132/97 mmHg     Pulse Rate 09/06/15 2254 100     Resp 09/06/15 2254 20     Temp 09/06/15 2254 99.9 F (37.7 C)     Temp Source 09/06/15 2254 Oral     SpO2 09/06/15 2254 100 %     Weight 09/06/15 2254 170 lb (77.111 kg)     Height 09/06/15 2254 5\' 10"  (1.778 m)     Head Cir --      Peak Flow --      Pain Score 09/06/15 2255 0     Pain Loc --      Pain Edu? --      Excl. in GC? --     Constitutional: Asleep, reluctant to participate in interview and exam. Well appearing and in no acute distress. Eyes: Conjunctivae are normal. PERRL. EOMI. Head: Atraumatic.  Nose: No congestion/rhinnorhea. Mouth/Throat: Mucous membranes are moist.  Oropharynx non-erythematous. Neck: No stridor.  Supple neck without meningismus. Cardiovascular: Normal rate, regular rhythm. Grossly normal heart sounds.  Good peripheral circulation. Respiratory: Normal respiratory effort.  No retractions. Lungs CTAB. Gastrointestinal: Soft and nontender. No distention. No abdominal bruits. No CVA tenderness. Musculoskeletal: No lower extremity tenderness nor edema.  No joint effusions. Neurologic:  Normal speech and language. No gross focal neurologic deficits are appreciated. No gait instability. Skin:  Skin is warm, dry and intact. No rash noted. No  petechiae. Psychiatric: Mood and affect are flat. Speech and behavior are flat.  ____________________________________________   LABS (all labs ordered are listed, but only abnormal results are displayed)  Labs Reviewed  COMPREHENSIVE METABOLIC PANEL - Abnormal; Notable for the following:    Glucose, Bld 110 (*)    Creatinine, Ser 1.28 (*)    Total Protein 8.3 (*)    AST 135 (*)    All other components within normal limits  ACETAMINOPHEN LEVEL - Abnormal; Notable for the following:    Acetaminophen (Tylenol), Serum <10 (*)    All other components within normal limits  CBC - Abnormal; Notable for the following:    WBC 11.5 (*)    MCV 79.9 (*)    All other components within normal limits  CK - Abnormal; Notable for the following:    Total CK 8365 (*)    All other components within normal limits  URINALYSIS COMPLETEWITH MICROSCOPIC ([facility] ONLY) - Abnormal; Notable for the following:    Color, Urine YELLOW (*)    APPearance TURBID (*)    Ketones, ur 1+ (*)    Hgb urine dipstick 3+ (*)    Protein, ur 100 (*)    Bacteria, UA RARE (*)    All other components within normal limits  TROPONIN I - Abnormal; Notable for the following:    Troponin I 0.39 (*)    All other components within normal limits  ETHANOL  SALICYLATE LEVEL  URINE DRUG SCREEN, QUALITATIVE ([facility] ONLY)  MONONUCLEOSIS SCREEN  MYOGLOBIN, URINE   ____________________________________________  EKG  ED ECG REPORT I, SUNG,JADE J, the attending physician, personally viewed and interpreted this ECG.   Date: 09/07/2015  EKG Time: 0614  Rate: 80  Rhythm: normal EKG, normal sinus rhythm  Axis: RAD  Intervals:none  ST&T Change: Nonspecific  ____________________________________________  RADIOLOGY  None ____________________________________________   PROCEDURES  Procedure(s) performed: None  Procedures  Critical Care performed: No  ____________________________________________   INITIAL IMPRESSION /  ASSESSMENT AND PLAN / ED COURSE  Pertinent labs & imaging results that were available during my care of the patient were reviewed by me and considered in my medical decision making (see chart for details).  30 year old male who presents under IVC for suicidal thoughts, anxiety, grandiosity, hyperreligiosity; no previous physical or mental health issues. Consider delirium in the setting of recent fever; however, patient is currently afebrile and has had a recent extensive negative workup including negative LP, viral and HIV panels. More likely psychosis in the setting of sleep deprivation or first exacerbation of bipolar disorder. Laboratory urinalysis results unremarkable. Will maintain IVC status pending psychiatry disposition.  ----------------------------------------- 5:53 AM on 09/07/2015 -----------------------------------------  CK was added per PCP's mention that patient exerted himself in the heat prior to onset of symptoms. CK notable at 8365. Will add troponin and EKG; initiate IV fluid resuscitation. Discussed with hospitalist to evaluate patient in the emergency department for admission. ____________________________________________   FINAL CLINICAL IMPRESSION(S) /  ED DIAGNOSES  Final diagnoses:  Depression  Non-traumatic rhabdomyolysis  Elevated troponin      NEW MEDICATIONS STARTED DURING THIS VISIT:  New Prescriptions   No medications on file     Note:  This document was prepared using Dragon voice recognition software and may include unintentional dictation errors.    Irean Hong, MD 09/07/15 [phone]

## 2015-09-07 NOTE — Progress Notes (Signed)
Call[facility], to order cart for Telepsych TTS Assessment. Pt nurse is currently Dorinda Hillonald, Press photographercharge nurse stated she would notify Dorinda HillDonald and place cart in room for TTS assessment. Florie Carico K. Sherlon HandingHarris, LCAS-A, LPC-A, [facility]  Counselor 09/07/2015 7:39 AM

## 2015-09-07 NOTE — B[facility] Counselor (Signed)
Pt moved to Med floor. Gurtha Picker K. Sherlon HandingHarris, LCAS-A, LPC-A, [facility]  Counselor 09/07/2015 12:36 PM

## 2015-09-08 LAB — CBC
HCT: 35.4 % — ABNORMAL LOW (ref 40.0–52.0)
Hemoglobin: 12.2 g/dL — ABNORMAL LOW (ref 13.0–18.0)
MCH: 28 pg (ref 26.0–34.0)
MCHC: 34.6 g/dL (ref 32.0–36.0)
MCV: 80.8 fL (ref 80.0–100.0)
PLATELETS: 200 10*3/uL (ref 150–440)
RBC: 4.38 MIL/uL — AB (ref 4.40–5.90)
RDW: 12.8 % (ref 11.5–14.5)
WBC: 8.1 10*3/uL (ref 3.8–10.6)

## 2015-09-08 LAB — BASIC METABOLIC PANEL
Anion gap: 8 (ref 5–15)
CALCIUM: 8.3 mg/dL — AB (ref 8.9–10.3)
CHLORIDE: 107 mmol/L (ref 101–111)
CO2: 25 mmol/L (ref 22–32)
CREATININE: 0.98 mg/dL (ref 0.61–1.24)
GFR calc Af Amer: >60 mL/min (ref >60)
GFR calc non Af Amer: >60 mL/min (ref >60)
GLUCOSE: 95 mg/dL (ref 65–99)
Potassium: 2.9 mmol/L — ABNORMAL LOW (ref 3.5–5.1)
Sodium: 140 mmol/L (ref 135–145)

## 2015-09-08 LAB — URINE CULTURE
CULTURE: NO GROWTH
SPECIAL REQUESTS: NORMAL

## 2015-09-08 LAB — CK: CK TOTAL: 5834 U/L — AB (ref 49–397)

## 2015-09-08 MED ORDER — QUETIAPINE FUMARATE 100 MG PO TABS
200.0000 mg | ORAL_TABLET | Freq: Every day | ORAL | Status: DC
  Administered 2015-09-09: 200 mg via ORAL
  Filled 2015-09-08: qty 2

## 2015-09-08 MED ORDER — POTASSIUM CHLORIDE CRYS ER 20 MEQ PO TBCR
60.0000 meq | EXTENDED_RELEASE_TABLET | Freq: Once | ORAL | Status: AC
  Administered 2015-09-08: 08:00:00 60 meq via ORAL
  Filled 2015-09-08: qty 3

## 2015-09-08 MED ORDER — LORAZEPAM 2 MG/ML IJ SOLN
1.0000 mg | Freq: Once | INTRAMUSCULAR | Status: AC
  Administered 2015-09-08: 01:00:00 1 mg via INTRAVENOUS
  Filled 2015-09-08: qty 1

## 2015-09-08 NOTE — Care Management Note (Signed)
Case Management Note  Patient Details  Name: Louis Newton MRN: [id number] Date of Birth: 07-04-1985  Subjective/Objective:    30yo Mr Louis Newton was admitted under IVC on 09/06/15 after making suicidal statements and behaving erratically x 1 week. Visual hallucinations.  Dx: UTI, Rhabdomyolysis, and reactive psychosis. Dr Toni Amendlapacs from Psychiatry is following. Unemployed Mr Louis Newton resides with his parents. No previous psychiatric history. [facility] after medically [facility].                  Action/Plan:   Expected Discharge Date:                  Expected Discharge Plan:     In-House Referral:     Discharge planning Services     Post Acute Care Choice:    Choice offered to:     DME Arranged:    DME Agency:     HH Arranged:    HH Agency:     Status of Service:     If discussed at [employer] Length of Stay Meetings, dates discussed:    Additional Comments:  Louis Madril A, RN 09/08/2015, 2:55 PM

## 2015-09-08 NOTE — Progress Notes (Signed)
[employer] [facility] [place] [facility]   PATIENT NAME: Louis Newton    MR#:  [id number]  DATE OF BIRTH:  Jul 26, 1985  SUBJECTIVE:  CHIEF COMPLAINT:   Chief Complaint  Patient presents with  . Psychiatric Evaluation   No complaint. REVIEW OF SYSTEMS:  Review of Systems  Constitutional: Negative for fever and chills.  Eyes: Negative for blurred vision and double vision.  Respiratory: Negative for cough, hemoptysis, sputum production and shortness of breath.   Cardiovascular: Negative for chest pain, palpitations and orthopnea.  Gastrointestinal: Negative for nausea, vomiting, abdominal pain and diarrhea.  Genitourinary: Negative for dysuria, urgency and frequency.  Musculoskeletal: Negative for myalgias, back pain and neck pain.  Neurological: Negative for dizziness, seizures, loss of consciousness and headaches.  Psychiatric/Behavioral: Negative for depression and suicidal ideas. The patient is not nervous/anxious.     DRUG ALLERGIES:   Allergies  Allergen Reactions  . Bee Venom Swelling   VITALS:  Blood pressure 143/71, pulse 77, temperature 99.9 F (37.7 C), temperature source Oral, resp. rate 19, height  (1.778 m), weight 170 lb (77.111 kg), SpO2 100 %. PHYSICAL EXAMINATION:  Physical Exam  Constitutional: He is oriented to person, place, and time and well-developed, well-nourished, and in no distress.  HENT:  Head: Normocephalic and atraumatic.  Eyes: Conjunctivae and EOM are normal. Pupils are equal, round, and reactive to light.  Neck: No thyromegaly present.  Cardiovascular: Normal rate, regular rhythm, normal heart sounds and intact distal pulses.  Exam reveals no gallop and no friction rub.   No murmur heard. Pulmonary/Chest: Effort normal and breath sounds normal. No respiratory distress. He has no wheezes. He has no rales. He exhibits no tenderness.  Abdominal: He exhibits no distension. There is no tenderness. There is no rebound and no  guarding.  Musculoskeletal: He exhibits no edema or tenderness.  Neurological: He is alert and oriented to person, place, and time.  Skin: Skin is warm and dry. No rash noted. No erythema.  Psychiatric: Mood, memory, affect and judgment normal.   LABORATORY PANEL:   CBC  Recent Labs Lab 09/08/15 0445  WBC 8.1  HGB 12.2*  HCT 35.4*  PLT 200   ------------------------------------------------------------------------------------------------------------------ Chemistries   Recent Labs Lab 09/06/15 2304 09/07/15 0923 09/08/15 0445  NA 141  --  140  K 3.5  --  2.9*  CL 102  --  107  CO2 28  --  25  GLUCOSE 110*  --  95  BUN 12  --  <5*  CREATININE 1.28*  --  0.98  CALCIUM 9.8  --  8.3*  MG  --  1.8  --   AST 135*  --   --   ALT 62  --   --   ALKPHOS 67  --   --   BILITOT 0.8  --   --    RADIOLOGY:  No results found. ASSESSMENT AND PLAN:   Rhabdomyolysis. Better, Continue NS iv, follow up CK.  ARF. Improved with IVF.  Reactive Psychosis. Started seroquel per Dr. Toni Amend. Ativan prn and sitter.  UTI. Continue cipro, follow up urine culture.  Hypokalemia. Po klor-con, f/u BMP.   All the records are reviewed and case discussed with Care Management/Social Worker. Management plans discussed with the patient, family and they are in agreement.  CODE STATUS: full code.  TOTAL TIME TAKING CARE OF THIS PATIENT: 33 minutes.   More than 50% of the time was spent in counseling/coordination of care: YES  POSSIBLE D/C IN 2 DAYS, DEPENDING ON CLINICAL CONDITION.   Shaune Pollackhen, Cedar Roseman M.D on 09/08/2015 at 11:23 AM  Between 7am to 6pm - Pager - [phone]  After 6pm go to [email or web address] - Social research officer, governmentpassword EPAS [facility]  [employer] [facility] [place] [facility]  Office  [phone]  CC: Primary care physician; Baruch GoutyMelinda Lada, MD  Note: This dictation was prepared with Dragon dictation along with smaller phrase technology. Any transcriptional errors that result from this process are  unintentional.

## 2015-09-08 NOTE — Consult Note (Signed)
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 Rhabdomyolysis [M62.82] 09/07/2015  . 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 Involuntary commitment [Z04.6] 09/07/2015    Total Time spent with patient: 20 minutes  Subjective:   KEENEN ROESSNER is a 30 y.o. male patient admitted with "I've been seeing a bunch of stuff".  30 year old man with several days of what sounds like symptoms consistent with mania. No evidence of substance abuse. He tells me today that he is feeling better. He does not launch into nearly as much hyper religious talk although it is still present. He reveals however that last night a friend of his came by to visit and during that visit he became convinced somehow that this person might represent the devil. This kept him agitated until one or 2 in the morning. So he is still having some psychotic symptoms. Been aggressive or violent or uncooperative. Vital signs all look reasonably good although his blood pressure here is high at the moment. His creatinine kinase is improving. Kidney function remains good.  HPI:  Patient interviewed. His mother was present also which was very helpful. Past chart including [facility] Notes reviewed. Labs and vitals reviewed. This 30 year old man was brought to the emergency room yesterday under IVC. 911 had been called by the family because the patient disappeared and was gone for several hours and they were worried about him. Patient was hyperverbal hyper religious and appeared to be confused when they found him. He is a pretty good historian today. He and his mother  tell the same story. About a week ago the patient started feeling feverish. He denies being aware of any other specific physical symptoms. He went to [facility] emergency room 3 times over several days and had a workup that included the usual screens for infection as well as a spinal tap and viral panels. No diagnosis was found. At that time he was already complaining that he had not slept for a few days. At this point the patient says he is now not slept for probably nearly a week. If so lonely a few hours. He reports that he has been having visual hallucinations. He says that he has seen images of biblical scenes playing out in front of him. He also has seen visions of dead relatives and friends. The most remarkable thing seems to be a new onset of extremely hyper religious thinking. Patient talks nonstop about the Bible. His knowledge of the Bible is limited and confused. He tends to repeat himself and to tie things together in ways that don't make sense. He appears to be implying that he is receiving some kind of religious message. Talks a lot about how he is getting the word of the Bible out to the world. Patient says that during the time that he had disappeared yesterday when his family was worried about him he had been running through the woods. He says at one point he had climbed into a trash can. Patient says his mood has been feeling nervous and worried but also upbeat. Throughout this time however he does not really report other specific localizing physical symptoms. Patient absolutely denies any  drug abuse. Denies any use of marijuana cocaine hallucinogens unusual street drugs or even alcohol. Mother confirms that he is not known to have any kind of substance use problem. Patient was not under any psychiatric treatment and does not have any prior psychiatric diagnosis. We talked for a while trying to identify any kind of recent psychosocial trauma or stress and there did not appear to be any.  Social  history: Patient lives with his mother and stepfather. He is not currently working. This is been going on for months. It does seem to be a little stressful for him. He has a girlfriend who has a child. He expresses a lot of worry and anxiety about that but nothing specific.  Medical history: Presented with elevated creatinine kinase and some degree of rhabdomyolysis. Most likely related to the physical exertion and fevers.  Substance abuse history: I look back through his chart his notes and spoke with the patient and his mother and really could not identify any evidence of any kind of substance use problem.  Past Psychiatric History: There is no past psychiatric history. No history of psychiatric hospitalization, no history of suicide attempts, no history of prior psychotic episodes. Never been prescribed any psychiatric medicine in the past.  Risk to Self: Is patient at risk for suicide?: No Risk to Others:   Prior Inpatient Therapy:   Prior Outpatient Therapy:    Past Medical History: History reviewed. No pertinent past medical history. History reviewed. No pertinent past surgical history. Family History:  Family History  Problem Relation Age of Onset  . Kidney disease Father   . Hypertension Daughter   . Epilepsy Daughter    Family Psychiatric  History: There was mention that his biological father had "been through the same sort of thing" but when I pressed for details my impression is more that they were talking about the rhabdomyolysis. There is really no known specific history of mental health problems in the family Social History:  History  Alcohol Use  . 0.0 oz/week  . 0 Standard drinks or equivalent per week    Comment: today, 1 beer     History  Drug Use No    Social History   Social History  . Marital Status: Single    Spouse Name: N/A  . Number of Children: N/A  . Years of Education: N/A   Social History Main Topics  . Smoking status: Never Smoker   . Smokeless  tobacco: Never Used  . Alcohol Use: 0.0 oz/week    0 Standard drinks or equivalent per week     Comment: today, 1 beer  . Drug Use: No  . Sexual Activity: Yes    Birth Control/ Protection: None   Other Topics Concern  . None   Social History Narrative   Additional Social History:    Allergies:   Allergies  Allergen Reactions  . Bee Venom Swelling    Labs:  Results for orders placed or performed during the hospital encounter of 09/06/15 (from the past 48 hour(s))  Comprehensive metabolic panel     Status: Abnormal   Collection Time: 09/06/15 11:04 PM  Result Value Ref Range   Sodium 141 135 - 145 mmol/L   Potassium 3.5 3.5 - 5.1 mmol/L   Chloride 102 101 - 111 mmol/L   CO2 28 22 - 32 mmol/L   Glucose, Bld 110 (H) 65 - 99 mg/dL   BUN 12 6 - 20 mg/dL   Creatinine, Ser 1.28 (H) 0.61 -  1.24 mg/dL   Calcium 9.8 8.9 - 10.3 mg/dL   Total Protein 8.3 (H) 6.5 - 8.1 g/dL   Albumin 4.0 3.5 - 5.0 g/dL   AST 135 (H) 15 - 41 U/L   ALT 62 17 - 63 U/L   Alkaline Phosphatase 67 38 - 126 U/L   Total Bilirubin 0.8 0.3 - 1.2 mg/dL   GFR calc non Af Amer >60 >60 mL/min   GFR calc Af Amer >60 >60 mL/min    Comment: (NOTE) The eGFR has been calculated using the CKD EPI equation. This calculation has not been validated in all clinical situations. eGFR's persistently <60 mL/min signify possible Chronic Kidney Disease.    Anion gap 11 5 - 15  Ethanol     Status: None   Collection Time: 09/06/15 11:04 PM  Result Value Ref Range   Alcohol, Ethyl (B) <5 <5 mg/dL    Comment:        LOWEST DETECTABLE LIMIT FOR SERUM ALCOHOL IS 5 mg/dL FOR MEDICAL PURPOSES ONLY   Salicylate level     Status: None   Collection Time: 09/06/15 11:04 PM  Result Value Ref Range   Salicylate Lvl <9.7 2.8 - 30.0 mg/dL  Acetaminophen level     Status: Abnormal   Collection Time: 09/06/15 11:04 PM  Result Value Ref Range   Acetaminophen (Tylenol), Serum <10 (L) 10 - 30 ug/mL    Comment:        THERAPEUTIC  CONCENTRATIONS VARY SIGNIFICANTLY. A RANGE OF 10-30 ug/mL MAY BE AN EFFECTIVE CONCENTRATION FOR MANY PATIENTS. HOWEVER, SOME ARE BEST TREATED AT CONCENTRATIONS OUTSIDE THIS RANGE. ACETAMINOPHEN CONCENTRATIONS >150 ug/mL AT 4 HOURS AFTER INGESTION AND >50 ug/mL AT 12 HOURS AFTER INGESTION ARE OFTEN ASSOCIATED WITH TOXIC REACTIONS.   cbc     Status: Abnormal   Collection Time: 09/06/15 11:04 PM  Result Value Ref Range   WBC 11.5 (H) 3.8 - 10.6 K/uL   RBC 5.21 4.40 - 5.90 MIL/uL   Hemoglobin 14.5 13.0 - 18.0 g/dL   HCT 41.7 40.0 - 52.0 %   MCV 79.9 (L) 80.0 - 100.0 fL   MCH 27.8 26.0 - 34.0 pg   MCHC 34.8 32.0 - 36.0 g/dL   RDW 12.5 11.5 - 14.5 %   Platelets 200 150 - 440 K/uL  Mononucleosis screen     Status: None   Collection Time: 09/06/15 11:04 PM  Result Value Ref Range   Mono Screen NEGATIVE NEGATIVE  CK     Status: Abnormal   Collection Time: 09/06/15 11:04 PM  Result Value Ref Range   Total CK 8365 (H) 49 - 397 U/L    Comment: RESULT CONFIRMED BY MANUAL DILUTION  Troponin I     Status: Abnormal   Collection Time: 09/06/15 11:04 PM  Result Value Ref Range   Troponin I 0.39 (HH) <0.03 ng/mL    Comment: CRITICAL RESULT CALLED TO, READ BACK BY AND VERIFIED WITH BRYAN GRAY AT 6734 09/07/15.PMH  Urine Drug Screen, Qualitative     Status: None   Collection Time: 09/06/15 11:56 PM  Result Value Ref Range   Tricyclic, Ur Screen NONE DETECTED NONE DETECTED   Amphetamines, Ur Screen NONE DETECTED NONE DETECTED   MDMA (Ecstasy)Ur Screen NONE DETECTED NONE DETECTED   Cocaine Metabolite,Ur [place] NONE DETECTED NONE DETECTED   Opiate, Ur Screen NONE DETECTED NONE DETECTED   Phencyclidine (PCP) Ur S NONE DETECTED NONE DETECTED   Cannabinoid 50 Ng, Ur [place] NONE DETECTED NONE DETECTED  Barbiturates, Ur Screen NONE DETECTED NONE DETECTED   Benzodiazepine, Ur Scrn NONE DETECTED NONE DETECTED   Methadone Scn, Ur NONE DETECTED NONE DETECTED    Comment: (NOTE) 176  Tricyclics, urine                Cutoff 1000 ng/mL 200  Amphetamines, urine             Cutoff 1000 ng/mL 300  MDMA (Ecstasy), urine           Cutoff 500 ng/mL 400  Cocaine Metabolite, urine       Cutoff 300 ng/mL 500  Opiate, urine                   Cutoff 300 ng/mL 600  Phencyclidine (PCP), urine      Cutoff 25 ng/mL 700  Cannabinoid, urine              Cutoff 50 ng/mL 800  Barbiturates, urine             Cutoff 200 ng/mL 900  Benzodiazepine, urine           Cutoff 200 ng/mL 1000 Methadone, urine                Cutoff 300 ng/mL 1100 1200 The urine drug screen provides only a preliminary, unconfirmed 1300 analytical test result and should not be used for non-medical 1400 purposes. Clinical consideration and professional judgment should 1500 be applied to any positive drug screen result due to possible 1600 interfering substances. A more specific alternate chemical method 1700 must be used in order to obtain a confirmed analytical result.  1800 Gas chromato graphy / mass spectrometry (GC/MS) is the preferred 1900 confirmatory method.   Urinalysis complete, with microscopic ([facility] only)     Status: Abnormal   Collection Time: 09/06/15 11:56 PM  Result Value Ref Range   Color, Urine YELLOW (A) YELLOW   APPearance TURBID (A) CLEAR   Glucose, UA NEGATIVE NEGATIVE mg/dL   Bilirubin Urine NEGATIVE NEGATIVE   Ketones, ur 1+ (A) NEGATIVE mg/dL   Specific Gravity, Urine 1.030 1.005 - 1.030   Hgb urine dipstick 3+ (A) NEGATIVE   pH 6.0 5.0 - 8.0   Protein, ur 100 (A) NEGATIVE mg/dL   Nitrite NEGATIVE NEGATIVE   Leukocytes, UA NEGATIVE NEGATIVE   RBC / HPF 0-5 0 - 5 RBC/hpf   WBC, UA 6-30 0 - 5 WBC/hpf   Bacteria, UA RARE (A) NONE SEEN   Squamous Epithelial / LPF NONE SEEN NONE SEEN   Mucous PRESENT   Magnesium     Status: None   Collection Time: 09/07/15  9:23 AM  Result Value Ref Range   Magnesium 1.8 1.7 - 2.4 mg/dL  Phosphorus     Status: None   Collection Time: 09/07/15  9:23 AM  Result Value Ref  Range   Phosphorus 3.0 2.5 - 4.6 mg/dL  Urine culture     Status: None   Collection Time: 09/07/15  9:27 AM  Result Value Ref Range   Specimen Description URINE, CLEAN CATCH    Special Requests Normal    Culture NO GROWTH Performed at [facility]     Report Status 09/08/2015 FINAL   Basic metabolic panel     Status: Abnormal   Collection Time: 09/08/15  4:45 AM  Result Value Ref Range   Sodium 140 135 - 145 mmol/L   Potassium 2.9 (L) 3.5 - 5.1 mmol/L   Chloride 107 101 -  111 mmol/L   CO2 25 22 - 32 mmol/L   Glucose, Bld 95 65 - 99 mg/dL   BUN <5 (L) 6 - 20 mg/dL   Creatinine, Ser 0.98 0.61 - 1.24 mg/dL   Calcium 8.3 (L) 8.9 - 10.3 mg/dL   GFR calc non Af Amer >60 >60 mL/min   GFR calc Af Amer >60 >60 mL/min    Comment: (NOTE) The eGFR has been calculated using the CKD EPI equation. This calculation has not been validated in all clinical situations. eGFR's persistently <60 mL/min signify possible Chronic Kidney Disease.    Anion gap 8 5 - 15  CBC     Status: Abnormal   Collection Time: 09/08/15  4:45 AM  Result Value Ref Range   WBC 8.1 3.8 - 10.6 K/uL   RBC 4.38 (L) 4.40 - 5.90 MIL/uL   Hemoglobin 12.2 (L) 13.0 - 18.0 g/dL   HCT 35.4 (L) 40.0 - 52.0 %   MCV 80.8 80.0 - 100.0 fL   MCH 28.0 26.0 - 34.0 pg   MCHC 34.6 32.0 - 36.0 g/dL   RDW 12.8 11.5 - 14.5 %   Platelets 200 150 - 440 K/uL  CK     Status: Abnormal   Collection Time: 09/08/15  4:45 AM  Result Value Ref Range   Total CK 5834 (H) 49 - 397 U/L    Comment: RESULT CONFIRMED BY MANUAL DILUTION    Current Facility-Administered Medications  Medication Dose Route Frequency Provider Last Rate Last Dose  . 0.9 %  sodium chloride infusion   Intravenous Continuous Demetrios Loll, MD 150 mL/hr at 09/08/15 0554    . acetaminophen (TYLENOL) tablet 650 mg  650 mg Oral Q6H PRN Demetrios Loll, MD       Or  . acetaminophen (TYLENOL) suppository 650 mg  650 mg Rectal Q6H PRN Demetrios Loll, MD      . albuterol (PROVENTIL)  (2.5 MG/3ML) 0.083% nebulizer solution 2.5 mg  2.5 mg Nebulization Q2H PRN Demetrios Loll, MD      . ciprofloxacin (CIPRO) tablet 500 mg  500 mg Oral BID Demetrios Loll, MD   500 mg at 09/08/15 0819  . enoxaparin (LOVENOX) injection 40 mg  40 mg Subcutaneous Q24H Demetrios Loll, MD   40 mg at 09/08/15 0818  . HYDROcodone-acetaminophen (NORCO/VICODIN) 5-325 MG per tablet 1-2 tablet  1-2 tablet Oral Q4H PRN Demetrios Loll, MD   2 tablet at 09/08/15 1700  . LORazepam (ATIVAN) tablet 0.25-0.5 mg  0.25-0.5 mg Oral Q6H PRN Demetrios Loll, MD   0.5 mg at 09/08/15 1700  . morphine 4 MG/ML injection 4 mg  4 mg Intravenous Q4H PRN Demetrios Loll, MD      . ondansetron [facility]) tablet 4 mg  4 mg Oral Q6H PRN Demetrios Loll, MD       Or  . ondansetron [facility]) injection 4 mg  4 mg Intravenous Q6H PRN Demetrios Loll, MD      . QUEtiapine (SEROQUEL) tablet 200 mg  200 mg Oral QHS Gonzella Lex, MD        Musculoskeletal: Strength & Muscle Tone: within normal limits Gait & Station: normal Patient leans: N/A  Psychiatric Specialty Exam: Physical Exam  Nursing note and vitals reviewed. Constitutional: He appears well-developed and well-nourished.  HENT:  Head: Normocephalic and atraumatic.  Eyes: Conjunctivae are normal. Pupils are equal, round, and reactive to light.  Neck: Normal range of motion.  Cardiovascular: Regular rhythm and normal heart sounds.   Respiratory:  Effort normal. No respiratory distress.  GI: Soft.  Musculoskeletal: Normal range of motion.  Neurological: He is alert.  Skin: Skin is warm and dry.  Psychiatric: His speech is normal and behavior is normal. His affect is labile. Thought content is delusional.  Patient presents as hyper religious and somewhat grandiose and odd in his thinking but is not delirious and I wouldn't really call him disorganized.    Review of Systems  Constitutional: Negative.   HENT: Negative.   Eyes: Negative.   Respiratory: Negative.   Cardiovascular: Negative.   Gastrointestinal:  Negative.   Musculoskeletal: Negative.   Skin: Negative.   Neurological: Negative.   Psychiatric/Behavioral: Positive for hallucinations and memory loss. Negative for depression, suicidal ideas and substance abuse. The patient is nervous/anxious and has insomnia.     Blood pressure 162/81, pulse 93, temperature 98.8 F (37.1 C), temperature source Oral, resp. rate 18, height _0  (1.778 m), weight 77.111 kg (170 lb), SpO2 100 %.Body mass index is 24.39 kg/(m^2).  General Appearance: Well Groomed  Eye Contact:  Good  Speech:  Clear and Coherent and Pressured  Volume:  Increased  Mood:  Anxious and Euphoric  Affect:  Congruent  Thought Process:  Disorganized and Descriptions of Associations: Tangential  Orientation:  Full (Time, Place, and Person)  Thought Content:  Illogical and Hyper religious focus  Suicidal Thoughts:  No  Homicidal Thoughts:  No  Memory:  Immediate;   Good Recent;   Fair Remote;   Poor  Judgement:  Fair  Insight:  Shallow  Psychomotor Activity:  Restlessness  Concentration:  Concentration: Fair  Recall:  [employer] of Knowledge:  Fair  Language:  Fair  Akathisia:  No  Handed:  Right  AIMS (if indicated):     Assets:  Communication Skills Desire for Improvement Housing Physical Health Resilience Social Support  ADL's:  Intact  Cognition:  WNL  Sleep:        Treatment Plan Summary: Daily contact with patient to assess and evaluate symptoms and progress in treatment, Medication management and Plan Patient reevaluated today. He continues to be hyper religious but does not seem nearly as pressured over labile as he did yesterday. Nevertheless he is still talking about psychotic symptoms that he had as recently as last night. He was compliant with medicine. I spoke with his family including his older brother who wanted to speak to me apart from the patient. He indicated to me that this is not the first episode of odd behavior the patient has had although  the others have been much less severe. He also indicates that he in the extended family are frightened have the patient go back home with his mother because they don't think that she can "handle" him. I made no promises but will take this into account when considering whether he needs inpatient psychiatric treatment. For now I'm increasing his Seroquel to 200 mg at night.  Disposition: Supportive therapy provided about ongoing stressors. Discussed crisis plan, support from social network, calling 911, coming to the Emergency Department, and calling Suicide Hotline.  Alethia Berthold, MD 09/08/2015 5:35 PM

## 2015-09-09 LAB — BASIC METABOLIC PANEL
ANION GAP: 8 (ref 5–15)
BUN: 5 mg/dL — ABNORMAL LOW (ref 6–20)
CHLORIDE: 105 mmol/L (ref 101–111)
CO2: 26 mmol/L (ref 22–32)
Calcium: 8.6 mg/dL — ABNORMAL LOW (ref 8.9–10.3)
Creatinine, Ser: 0.86 mg/dL (ref 0.61–1.24)
GFR calc Af Amer: >60 mL/min (ref >60)
GFR calc non Af Amer: >60 mL/min (ref >60)
GLUCOSE: 91 mg/dL (ref 65–99)
Potassium: 3.5 mmol/L (ref 3.5–5.1)
Sodium: 139 mmol/L (ref 135–145)

## 2015-09-09 LAB — CK: Total CK: 3295 U/L — ABNORMAL HIGH (ref 49–397)

## 2015-09-09 LAB — MYOGLOBIN, URINE: MYOGLOBIN UR: 194 ng/mL — AB (ref 0–13)

## 2015-09-09 NOTE — Progress Notes (Signed)
[facility] [place] [facility]   PATIENT NAME: Louis Newton    MR#:  [id number]  DATE OF BIRTH:  01-19-1986  SUBJECTIVE:  CHIEF COMPLAINT:   Chief Complaint  Patient presents with  . Psychiatric Evaluation   No complaint. REVIEW OF SYSTEMS:  Review of Systems  Constitutional: Negative for fever and chills.  Eyes: Negative for blurred vision and double vision.  Respiratory: Negative for cough, hemoptysis, sputum production and shortness of breath.   Cardiovascular: Negative for chest pain, palpitations and orthopnea.  Gastrointestinal: Negative for nausea, vomiting, abdominal pain and diarrhea.  Genitourinary: Negative for dysuria, urgency and frequency.  Musculoskeletal: Negative for myalgias, back pain and neck pain.  Neurological: Negative for dizziness, seizures, loss of consciousness and headaches.  Psychiatric/Behavioral: Negative for depression and suicidal ideas. The patient is not nervous/anxious.     DRUG ALLERGIES:   Allergies  Allergen Reactions  . Bee Venom Swelling   VITALS:  Blood pressure 145/74, pulse 72, temperature 98.3 F (36.8 C), temperature source Oral, resp. rate 25, height  (1.778 m), weight 162 lb (73.483 kg), SpO2 100 %. PHYSICAL EXAMINATION:  Physical Exam  Constitutional: He is oriented to person, place, and time and well-developed, well-nourished, and in no distress.  HENT:  Head: Normocephalic and atraumatic.  Eyes: Conjunctivae and EOM are normal. Pupils are equal, round, and reactive to light.  Neck: No thyromegaly present.  Cardiovascular: Normal rate, regular rhythm, normal heart sounds and intact distal pulses.  Exam reveals no gallop and no friction rub.   No murmur heard. Pulmonary/Chest: Effort normal and breath sounds normal. No respiratory distress. He has no wheezes. He has no rales. He exhibits no tenderness.  Abdominal: He exhibits no distension. There is no tenderness. There is no rebound and no  guarding.  Musculoskeletal: He exhibits no edema or tenderness.  Neurological: He is alert and oriented to person, place, and time.  Skin: Skin is warm and dry. No rash noted. No erythema.  Psychiatric: Mood, memory, affect and judgment normal.   LABORATORY PANEL:   CBC  Recent Labs Lab 09/08/15 0445  WBC 8.1  HGB 12.2*  HCT 35.4*  PLT 200   ------------------------------------------------------------------------------------------------------------------ Chemistries   Recent Labs Lab 09/06/15 2304 09/07/15 0923  09/09/15 0626  NA 141  --   < > 139  K 3.5  --   < > 3.5  CL 102  --   < > 105  CO2 28  --   < > 26  GLUCOSE 110*  --   < > 91  BUN 12  --   < > 5*  CREATININE 1.28*  --   < > 0.86  CALCIUM 9.8  --   < > 8.6*  MG  --  1.8  --   --   AST 135*  --   --   --   ALT 62  --   --   --   ALKPHOS 67  --   --   --   BILITOT 0.8  --   --   --   < > = values in this interval not displayed. RADIOLOGY:  No results found. ASSESSMENT AND PLAN:   Rhabdomyolysis. Better, Continue NS iv, follow up CK.  ARF. Improved with IVF.  Reactive Psychosis. Started and increased seroquel to 200 mg HS.per Dr. Toni Newton. Ativan prn and sitter.  UTI. Continue cipro, follow up urine culture: no growth.  Hypokalemia. Improved with Po klor-con.  All  the records are reviewed and case discussed with Care Management/Social Worker. Management plans discussed with the patient, his mother and they are in agreement.  CODE STATUS: full code.  TOTAL TIME TAKING CARE OF THIS PATIENT: 25 minutes.   More than 50% of the time was spent in counseling/coordination of care: YES  POSSIBLE D/C IN 2 DAYS, DEPENDING ON CLINICAL CONDITION.   Louis Newton, Louis Newton M.D on 09/09/2015 at 3:38 PM  Between 7am to 6pm - Pager - [phone]  After 6pm go to [email or web address] - Social research officer, governmentpassword EPAS [facility]  [facility]  Office  [phone]  CC: Primary care physician; Baruch GoutyMelinda Lada, MD  Note:  This dictation was prepared with Dragon dictation along with smaller phrase technology. Any transcriptional errors that result from this process are unintentional.

## 2015-09-09 NOTE — Consult Note (Signed)
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 Rhabdomyolysis [M62.82] 09/07/2015  . 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 Involuntary commitment [Z04.6] 09/07/2015    Total Time spent with patient: 20 minutes  Subjective:   Louis Newton is a 30 y.o. male patient admitted with "I've been seeing a bunch of stuff". Follow-up on Monday the 17th for this 30 year old man who has had a hyper religious mania. Patient interviewed chart reviewed spoke with his family again this time a different brother from the one I saw yesterday. Patient continues to say that his mind is troubled by hyper religious thoughts. He is trying to hide it and minimize it. He's not sleeping well at night still. Evidently he refused his Seroquel last night although he claims he did not.  HPI:  Patient interviewed. His mother was present also which was very helpful. Past chart including [facility] Notes reviewed. Labs and vitals reviewed. This 30 year old man was brought to the emergency room yesterday under IVC. 911 had been called by the family because the patient disappeared and was gone for several hours and they were worried about him. Patient was hyperverbal hyper religious and appeared to be confused when they found him. He is a pretty good historian today. He and his mother tell the same story. About a week ago the patient started feeling feverish. He denies being aware of any other specific physical symptoms. He went to [facility] emergency room 3 times over several days and had a workup that included the usual screens for infection as well  as a spinal tap and viral panels. No diagnosis was found. At that time he was already complaining that he had not slept for a few days. At this point the patient says he is now not slept for probably nearly a week. If so lonely a few hours. He reports that he has been having visual hallucinations. He says that he has seen images of biblical scenes playing out in front of him. He also has seen visions of dead relatives and friends. The most remarkable thing seems to be a new onset of extremely hyper religious thinking. Patient talks nonstop about the Bible. His knowledge of the Bible is limited and confused. He tends to repeat himself and to tie things together in ways that don't make sense. He appears to be implying that he is receiving some kind of religious message. Talks a lot about how he is getting the word of the Bible out to the world. Patient says that during the time that he had disappeared yesterday when his family was worried about him he had been running through the woods. He says at one point he had climbed into a trash can. Patient says his mood has been feeling nervous and worried but also upbeat. Throughout this time however he does not really report other specific localizing physical symptoms. Patient absolutely denies any drug abuse. Denies any use of marijuana cocaine hallucinogens unusual street drugs or even alcohol. Mother confirms that he is not known to have any kind of substance use problem. Patient was not under any psychiatric treatment and does not have any prior psychiatric diagnosis.  We talked for a while trying to identify any kind of recent psychosocial trauma or stress and there did not appear to be any.  Social history: Patient lives with his mother and stepfather. He is not currently working. This is been going on for months. It does seem to be a little stressful for him. He has a girlfriend who has a child. He expresses a lot of worry and anxiety about that but nothing  specific.  Medical history: Presented with elevated creatinine kinase and some degree of rhabdomyolysis. Most likely related to the physical exertion and fevers.  Substance abuse history: I look back through his chart his notes and spoke with the patient and his mother and really could not identify any evidence of any kind of substance use problem.  Past Psychiatric History: There is no past psychiatric history. No history of psychiatric hospitalization, no history of suicide attempts, no history of prior psychotic episodes. Never been prescribed any psychiatric medicine in the past.  Risk to Self: Is patient at risk for suicide?: No Risk to Others:   Prior Inpatient Therapy:   Prior Outpatient Therapy:    Past Medical History: History reviewed. No pertinent past medical history. History reviewed. No pertinent past surgical history. Family History:  Family History  Problem Relation Age of Onset  . Kidney disease Father   . Hypertension Daughter   . Epilepsy Daughter    Family Psychiatric  History: There was mention that his biological father had "been through the same sort of thing" but when I pressed for details my impression is more that they were talking about the rhabdomyolysis. There is really no known specific history of mental health problems in the family Social History:  History  Alcohol Use  . 0.0 oz/week  . 0 Standard drinks or equivalent per week    Comment: today, 1 beer     History  Drug Use No    Social History   Social History  . Marital Status: Single    Spouse Name: N/A  . Number of Children: N/A  . Years of Education: N/A   Social History Main Topics  . Smoking status: Never Smoker   . Smokeless tobacco: Never Used  . Alcohol Use: 0.0 oz/week    0 Standard drinks or equivalent per week     Comment: today, 1 beer  . Drug Use: No  . Sexual Activity: Yes    Birth Control/ Protection: None   Other Topics Concern  . None   Social History Narrative    Additional Social History:    Allergies:   Allergies  Allergen Reactions  . Bee Venom Swelling    Labs:  Results for orders placed or performed during the hospital encounter of 09/06/15 (from the past 48 hour(s))  Basic metabolic panel     Status: Abnormal   Collection Time: 09/08/15  4:45 AM  Result Value Ref Range   Sodium 140 135 - 145 mmol/L   Potassium 2.9 (L) 3.5 - 5.1 mmol/L   Chloride 107 101 - 111 mmol/L   CO2 25 22 - 32 mmol/L   Glucose, Bld 95 65 - 99 mg/dL   BUN <5 (L) 6 - 20 mg/dL   Creatinine, Ser 0.98 0.61 - 1.24 mg/dL   Calcium 8.3 (L) 8.9 - 10.3 mg/dL   GFR calc non Af Amer >60 >60 mL/min   GFR calc Af Amer >60 >60 mL/min    Comment: (NOTE) The eGFR has been calculated using the CKD  EPI equation. This calculation has not been validated in all clinical situations. eGFR's persistently <60 mL/min signify possible Chronic Kidney Disease.    Anion gap 8 5 - 15  CBC     Status: Abnormal   Collection Time: 09/08/15  4:45 AM  Result Value Ref Range   WBC 8.1 3.8 - 10.6 K/uL   RBC 4.38 (L) 4.40 - 5.90 MIL/uL   Hemoglobin 12.2 (L) 13.0 - 18.0 g/dL   HCT 35.4 (L) 40.0 - 52.0 %   MCV 80.8 80.0 - 100.0 fL   MCH 28.0 26.0 - 34.0 pg   MCHC 34.6 32.0 - 36.0 g/dL   RDW 12.8 11.5 - 14.5 %   Platelets 200 150 - 440 K/uL  CK     Status: Abnormal   Collection Time: 09/08/15  4:45 AM  Result Value Ref Range   Total CK 5834 (H) 49 - 397 U/L    Comment: RESULT CONFIRMED BY MANUAL DILUTION  CK     Status: Abnormal   Collection Time: 09/09/15  6:26 AM  Result Value Ref Range   Total CK 3295 (H) 49 - 397 U/L  Basic metabolic panel     Status: Abnormal   Collection Time: 09/09/15  6:26 AM  Result Value Ref Range   Sodium 139 135 - 145 mmol/L   Potassium 3.5 3.5 - 5.1 mmol/L   Chloride 105 101 - 111 mmol/L   CO2 26 22 - 32 mmol/L   Glucose, Bld 91 65 - 99 mg/dL   BUN 5 (L) 6 - 20 mg/dL   Creatinine, Ser 0.86 0.61 - 1.24 mg/dL   Calcium 8.6 (L) 8.9 - 10.3 mg/dL    GFR calc non Af Amer >60 >60 mL/min   GFR calc Af Amer >60 >60 mL/min    Comment: (NOTE) The eGFR has been calculated using the CKD EPI equation. This calculation has not been validated in all clinical situations. eGFR's persistently <60 mL/min signify possible Chronic Kidney Disease.    Anion gap 8 5 - 15    Current Facility-Administered Medications  Medication Dose Route Frequency Provider Last Rate Last Dose  . 0.9 %  sodium chloride infusion   Intravenous Continuous Demetrios Loll, MD 150 mL/hr at 09/09/15 1509    . acetaminophen (TYLENOL) tablet 650 mg  650 mg Oral Q6H PRN Demetrios Loll, MD       Or  . acetaminophen (TYLENOL) suppository 650 mg  650 mg Rectal Q6H PRN Demetrios Loll, MD      . albuterol (PROVENTIL) (2.5 MG/3ML) 0.083% nebulizer solution 2.5 mg  2.5 mg Nebulization Q2H PRN Demetrios Loll, MD      . ciprofloxacin (CIPRO) tablet 500 mg  500 mg Oral BID Demetrios Loll, MD   500 mg at 09/09/15 0805  . enoxaparin (LOVENOX) injection 40 mg  40 mg Subcutaneous Q24H Demetrios Loll, MD   40 mg at 09/09/15 0805  . HYDROcodone-acetaminophen (NORCO/VICODIN) 5-325 MG per tablet 1-2 tablet  1-2 tablet Oral Q4H PRN Demetrios Loll, MD   2 tablet at 09/08/15 2120  . LORazepam (ATIVAN) tablet 0.25-0.5 mg  0.25-0.5 mg Oral Q6H PRN Demetrios Loll, MD   0.5 mg at 09/09/15 0249  . morphine 4 MG/ML injection 4 mg  4 mg Intravenous Q4H PRN Demetrios Loll, MD      . ondansetron [facility]) tablet 4 mg  4 mg Oral Q6H PRN Demetrios Loll, MD       Or  . ondansetron [facility]) injection 4 mg  4 mg Intravenous Q6H PRN Demetrios Loll, MD      . QUEtiapine (SEROQUEL) tablet 200 mg  200 mg Oral QHS Gonzella Lex, MD   200 mg at 09/09/15 0100    Musculoskeletal: Strength & Muscle Tone: within normal limits Gait & Station: normal Patient leans: N/A  Psychiatric Specialty Exam: Physical Exam  Nursing note and vitals reviewed. Constitutional: He appears well-developed and well-nourished.  HENT:  Head: Normocephalic and atraumatic.  Eyes: Conjunctivae  are normal. Pupils are equal, round, and reactive to light.  Neck: Normal range of motion.  Cardiovascular: Regular rhythm and normal heart sounds.   Respiratory: Effort normal. No respiratory distress.  GI: Soft.  Musculoskeletal: Normal range of motion.  Neurological: He is alert.  Skin: Skin is warm and dry.  Psychiatric: His speech is normal and behavior is normal. His affect is labile. Thought content is delusional.  Patient presents as hyper religious and somewhat grandiose and odd in his thinking but is not delirious and I wouldn't really call him disorganized.    Review of Systems  Constitutional: Negative.   HENT: Negative.   Eyes: Negative.   Respiratory: Negative.   Cardiovascular: Negative.   Gastrointestinal: Negative.   Musculoskeletal: Negative.   Skin: Negative.   Neurological: Negative.   Psychiatric/Behavioral: Positive for hallucinations and memory loss. Negative for depression, suicidal ideas and substance abuse. The patient is nervous/anxious and has insomnia.     Blood pressure 145/74, pulse 72, temperature 98.3 F (36.8 C), temperature source Oral, resp. rate 25, height 5' 10"  (1.778 m), weight 73.483 kg (162 lb), SpO2 100 %.Body mass index is 23.24 kg/(m^2).  General Appearance: Well Groomed  Eye Contact:  Good  Speech:  Clear and Coherent and Pressured  Volume:  Increased  Mood:  Anxious and Euphoric  Affect:  Congruent  Thought Process:  Disorganized and Descriptions of Associations: Tangential  Orientation:  Full (Time, Place, and Person)  Thought Content:  Illogical and Hyper religious focus  Suicidal Thoughts:  No  Homicidal Thoughts:  No  Memory:  Immediate;   Good Recent;   Fair Remote;   Poor  Judgement:  Fair  Insight:  Shallow  Psychomotor Activity:  Restlessness  Concentration:  Concentration: Fair  Recall:  [place] of Knowledge:  Fair  Language:  Fair  Akathisia:  No  Handed:  Right  AIMS (if indicated):     Assets:   Communication Skills Desire for Improvement Housing Physical Health Resilience Social Support  ADL's:  Intact  Cognition:  WNL  Sleep:        Treatment Plan Summary: Daily contact with patient to assess and evaluate symptoms and progress in treatment, Medication management and Plan Patient's brother was able to give me much more history today particularly detailing how the patient has a long history of excessive anxiety and odd behavior and has had what sounds like near psychotic breakdowns in the past although nothing this severe. In other words this is not a completely new situation. Patient is still seeming to be troubled by some psychotic symptoms and is not fully compliant with treatment. It looks like he is likely to be kept on medicine for another day or 2 but at that point I am likely to recommend that we transfer him to the psychiatric ward. Continue current dose of Seroquel.  Disposition: Supportive therapy provided about ongoing stressors. Discussed crisis plan, support from social network, calling 911, coming to the Emergency Department, and calling Suicide Hotline.  Jenny Reichmann  Clapacs, MD 09/09/2015 5:59 PM

## 2015-09-10 ENCOUNTER — Inpatient Hospital Stay
Admission: AD | Admit: 2015-09-10 | Discharge: 2015-09-12 | DRG: 885 | Disposition: A | Discharge status: Home / Self Care | Payer: No Typology Code available for payment source | Source: Intra-hospital | Attending: Psychiatry | Admitting: Psychiatry

## 2015-09-10 DIAGNOSIS — Z8249 Family history of ischemic heart disease and other diseases of the circulatory system: Secondary | ICD-10-CM | POA: Diagnosis not present

## 2015-09-10 DIAGNOSIS — R443 Hallucinations, unspecified: Secondary | ICD-10-CM | POA: Diagnosis present

## 2015-09-10 DIAGNOSIS — F29 Unspecified psychosis not due to a substance or known physiological condition: Secondary | ICD-10-CM

## 2015-09-10 DIAGNOSIS — G4700 Insomnia, unspecified: Secondary | ICD-10-CM | POA: Diagnosis present

## 2015-09-10 DIAGNOSIS — R748 Abnormal levels of other serum enzymes: Secondary | ICD-10-CM | POA: Diagnosis present

## 2015-09-10 DIAGNOSIS — Z8739 Personal history of other diseases of the musculoskeletal system and connective tissue: Secondary | ICD-10-CM | POA: Diagnosis present

## 2015-09-10 DIAGNOSIS — Z841 Family history of disorders of kidney and ureter: Secondary | ICD-10-CM

## 2015-09-10 DIAGNOSIS — M6282 Rhabdomyolysis: Secondary | ICD-10-CM | POA: Diagnosis present

## 2015-09-10 DIAGNOSIS — Z046 Encounter for general psychiatric examination, requested by authority: Secondary | ICD-10-CM

## 2015-09-10 DIAGNOSIS — Z820 Family history of epilepsy and other diseases of the nervous system: Secondary | ICD-10-CM | POA: Diagnosis not present

## 2015-09-10 DIAGNOSIS — F23 Brief psychotic disorder: Secondary | ICD-10-CM | POA: Diagnosis not present

## 2015-09-10 LAB — CK: Total CK: 1436 U/L — ABNORMAL HIGH (ref 49–397)

## 2015-09-10 MED ORDER — ALUM & MAG HYDROXIDE-SIMETH 200-200-20 MG/5ML PO SUSP: 30.0000 mL | ORAL | Status: DC | PRN

## 2015-09-10 MED ORDER — TRAZODONE HCL 100 MG PO TABS
100.0000 mg | ORAL_TABLET | Freq: Every evening | ORAL | Status: DC | PRN
  Administered 2015-09-11: 100 mg via ORAL
  Filled 2015-09-10: qty 1

## 2015-09-10 MED ORDER — QUETIAPINE FUMARATE ER 300 MG PO TB24
300.0000 mg | ORAL_TABLET | Freq: Every day | ORAL | Status: DC
  Administered 2015-09-10 – 2015-09-11 (×2): 300 mg via ORAL
  Filled 2015-09-10 (×2): qty 1

## 2015-09-10 MED ORDER — ACETAMINOPHEN 325 MG PO TABS: 650.0000 mg | ORAL_TABLET | Freq: Four times a day (QID) | ORAL | Status: DC | PRN

## 2015-09-10 MED ORDER — MAGNESIUM HYDROXIDE 400 MG/5ML PO SUSP: 30.0000 mL | Freq: Every day | ORAL | Status: DC | PRN

## 2015-09-10 NOTE — Progress Notes (Signed)
Pt nurse called from 1C RN Shay [facility] stated she called BMU for pt. To be admitted [See prior Epic Notes], and was told to call [facility]. This counselor spoke with RN Danella DeisShay and advised  to call BMU charge nurse as pt. Is to be admitted , but no room assignment or availability as of yet.  Pre-admit cannot be done at this time.   Thoams Siefert K. Sherlon HandingHarris, LCAS-A, LPC-A, [facility]  Counselor 09/10/2015 9:31 AM

## 2015-09-10 NOTE — Progress Notes (Signed)
Charge nurse in behavioral called to notify of discharge - stating she will call me back when off of break. Bo McclintockBrewer,[place] Foerster S, RN

## 2015-09-10 NOTE — Tx Team (Signed)
Initial Interdisciplinary Treatment Plan   PATIENT STRESSORS: Health problems Traumatic event   PATIENT STRENGTHS: Average or above average intelligence Communication skills Physical Health   PROBLEM LIST: Problem List/Patient Goals Date to be addressed Date deferred Reason deferred Estimated date of resolution  Psychosis 09/10/2015     Visual hallucination 09/10/2015                                                DISCHARGE CRITERIA:  Ability to meet basic life and health needs Adequate post-discharge living arrangements Safe-care adequate arrangements made  PRELIMINARY DISCHARGE PLAN: Attend aftercare/continuing care group Return to previous living arrangement  PATIENT/FAMIILY INVOLVEMENT: This treatment plan has been presented to and reviewed with the patient, Kem BoroughsJamie L Herrin, and/or family member, The patient and family have been given the opportunity to ask questions and make suggestions.  Margo CommonGigi George Layah Skousen 09/10/2015, 4:44 PM

## 2015-09-10 NOTE — Progress Notes (Signed)
Patient is anxious but  cooperative during admission assessment. Patient denies SI/HI at this time. Patient states that he sees everything "old". Patient informed of fall risk status, fall risk assessed "low" at this time. Patient oriented to unit/staff/room. Patient denies any questions/concerns at this time. Patient safe on unit with Q15 minute checks for safety.Skin assessment & body search done.No contraband found.

## 2015-09-10 NOTE — Progress Notes (Signed)
[facility]. Toni Amendlapacs.  Attending Physician will be Dr. Jennet MaduroPucilowska.   Patient has been [facility] to room 303A, [facility].   Intake Paper Work has been signed and placed on patient chart.   Jola BabinskiMarilyn, Patient's Nurse & Marny LowensteinUrsula Patient Access). Domnique Vanegas K. Sherlon HandingHarris, LCAS-A, LPC-A, [facility]  Counselor 09/10/2015 12:12 PM

## 2015-09-10 NOTE — Progress Notes (Signed)
[facility] sitter and Environmental health practitionerburlington officer escort. Mother accompanying patient. Bo McclintockBrewer,Twan Harkin S, RN

## 2015-09-10 NOTE — Progress Notes (Signed)
[facility]. Bo McclintockBrewer,Brodyn Depuy S, RN

## 2015-09-10 NOTE — Discharge Summary (Signed)
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 No pertinent past medical history.  HOSPITAL COURSE:  Rhabdomyolysis. Better, Improving with NS iv.  ARF. Improved with IVF.  Reactive Psychosis. Started and increased seroquel to 200 mg HS.per Dr. Toni Amendlapacs. Ativan prn and sitter. Admitting to behavior medicine unit today.  UTI. Asymptomatic, treated with cipro, no growth per urine culture: discontinue cipro.  Hypokalemia. Improved with Po klor-con.  DISCHARGE CONDITIONS:   Medically stable, discharge to behavior medicine unit today.  CONSULTS OBTAINED:  Treatment Team:  Audery AmelJohn T Clapacs, MD  DRUG ALLERGIES:   Allergies  Allergen Reactions  . Bee Venom Swelling    DISCHARGE MEDICATIONS:   Current Discharge Medication List    STOP taking these medications     ciprofloxacin (CIPRO) 500 MG tablet      LORazepam (ATIVAN) 0.5 MG tablet          DISCHARGE INSTRUCTIONS:    DIET:  Regular diet.  DISCHARGE CONDITION:  Stable.  ACTIVITY:  As tolerated.  DISCHARGE LOCATION:    If you experience worsening of your admission symptoms, develop shortness of breath, life threatening emergency, suicidal or homicidal thoughts you must seek medical attention immediately by calling 911 or calling your MD immediately  if symptoms less severe.  You Must read complete instructions/literature along with all the possible adverse  reactions/side effects for all the Medicines you take and that have been prescribed to you. Take any new Medicines after you have completely understood and accpet all the possible adverse reactions/side effects.   Please note  You were cared for by a hospitalist during your hospital stay. If you have any questions about your discharge medications or the care you received while you were in the hospital after you are discharged, you can call the unit and asked to speak with the hospitalist on call if the hospitalist that took care of you is not available. Once you are discharged, your primary care physician will handle any further medical issues. Please note that NO REFILLS for any discharge medications will be authorized once you are discharged, as it is imperative that you return to your primary care physician (or establish a relationship with a primary care physician if you do not have one) for your aftercare needs so that they can reassess your need for medications and monitor your lab values.    On the day of Discharge:  VITAL SIGNS:  Blood pressure 136/68, pulse 77, temperature 98.6 F (37 C), temperature source Oral, resp. rate 20, height 5\' 10"  (1.778 m), weight 162 lb (73.483 kg), SpO2 100 %.  PHYSICAL EXAMINATION:  GENERAL:  30 y.o.-year-old patient lying in the bed with no acute distress.  EYES: Pupils equal, round, reactive to light and accommodation. No scleral icterus. Extraocular muscles intact.  HEENT: Head atraumatic, normocephalic. Oropharynx and nasopharynx clear.  NECK:  Supple, no jugular venous distention. No thyroid enlargement, no tenderness.  LUNGS: Normal breath sounds bilaterally, no wheezing, rales,rhonchi or crepitation. No use of accessory  muscles of respiration.  CARDIOVASCULAR: S1, S2 normal. No murmurs, rubs, or gallops.  ABDOMEN: Soft, non-tender, non-distended. Bowel sounds present. No organomegaly or mass.  EXTREMITIES: No pedal edema, cyanosis, or clubbing.    NEUROLOGIC: Cranial nerves II through XII are intact. Muscle strength 5/5 in all extremities. Sensation intact. Gait not checked.  PSYCHIATRIC: The patient is alert and oriented x 3.  SKIN: No obvious rash, lesion, or ulcer.  DATA REVIEW:   CBC  Recent Labs Lab 09/08/15 0445  WBC 8.1  HGB 12.2*  HCT 35.4*  PLT 200    Chemistries   Recent Labs Lab 09/06/15 2304 09/07/15 0923  09/09/15 0626  NA 141  --   < > 139  K 3.5  --   < > 3.5  CL 102  --   < > 105  CO2 28  --   < > 26  GLUCOSE 110*  --   < > 91  BUN 12  --   < > 5*  CREATININE 1.28*  --   < > 0.86  CALCIUM 9.8  --   < > 8.6*  MG  --  1.8  --   --   AST 135*  --   --   --   ALT 62  --   --   --   ALKPHOS 67  --   --   --   BILITOT 0.8  --   --   --   < > = values in this interval not displayed.  Cardiac Enzymes  Recent Labs Lab 09/06/15 2304  TROPONINI 0.39*    Microbiology Results  Results for orders placed or performed during the hospital encounter of 09/06/15  Urine culture     Status: None   Collection Time: 09/07/15  9:27 AM  Result Value Ref Range Status   Specimen Description URINE, CLEAN CATCH  Final   Special Requests Normal  Final   Culture NO GROWTH Performed at [facility]   Final   Report Status 09/08/2015 FINAL  Final    RADIOLOGY:  No results found.   Management plans discussed with the patient, his mother and they are in agreement.  CODE STATUS:     Code Status Orders        Start     Ordered   09/07/15 0855  Full code   Continuous     09/07/15 0854    Code Status History    Date Active Date Inactive Code Status Order ID Comments User Context   This patient has a current code status but no historical code status.      TOTAL TIME TAKING CARE OF THIS PATIENT: 31 minutes.    [name] M.D on 09/10/2015 at 2:42 PM  Between 7am to 6pm - Pager - [phone]  After 6pm go to [email or web address] - password [email or web address]  [facility]  Office   [phone]  CC: Primary care physician; Baruch Gouty, MD   Note: This dictation was prepared with Dragon dictation along with smaller phrase technology. Any transcriptional errors that result from this process are unintentional.

## 2015-09-10 NOTE — Progress Notes (Signed)
Behavioral medicine intake notified of discharge order. Bo McclintockBrewer,Wilford Merryfield S, RN

## 2015-09-10 NOTE — Discharge Instructions (Signed)
Regular diet. °Activity as tolerated. °

## 2015-09-10 NOTE — [facility] Group Notes (Signed)
[facility] Group Notes:  (Nursing/MHT/Case Management/Adjunct)  Date:  09/10/2015  Time:  11:13 PM  Type of Therapy:  Psychoeducational Skills  Participation Level:  Active  Participation Quality:  Appropriate  Affect:  Appropriate  Cognitive:  Appropriate  Insight:  Appropriate  Engagement in Group:  Engaged  Modes of Intervention:  Discussion, Education and Support  Summary of Progress/Problems:  Louis Newton 09/10/2015, 11:13 PM

## 2015-09-10 NOTE — Care Management Note (Signed)
Case Management Note  Patient Details  Name: Louis Newton MRN: [id number] Date of Birth: [birth year]  Subjective/Objective:   Admitted to [facility].                Action/Plan:   Expected Discharge Date:                  Expected Discharge Plan:     In-House Referral:     Discharge planning Services     Post Acute Care Choice:    Choice offered to:     DME Arranged:    DME Agency:     HH Arranged:    HH Agency:     Status of Service:     If discussed at [employer] Length of Stay Meetings, dates discussed:    Additional Comments:  Carmalita Wakefield A, RN 09/10/2015, 10:49 AM

## 2015-09-11 ENCOUNTER — Inpatient Hospital Stay: Payer: No Typology Code available for payment source

## 2015-09-11 DIAGNOSIS — F23 Brief psychotic disorder: Principal | ICD-10-CM

## 2015-09-11 LAB — LIPID PANEL
CHOL/HDL RATIO: 4.3 ratio
Cholesterol: 133 mg/dL (ref 0–200)
HDL: 31 mg/dL — ABNORMAL LOW (ref >40)
LDL Cholesterol: 88 mg/dL (ref 0–99)
Triglycerides: 69 mg/dL (ref <150)
VLDL: 14 mg/dL (ref 0–40)

## 2015-09-11 LAB — TSH: TSH: 1.149 u[IU]/mL (ref 0.350–4.500)

## 2015-09-11 LAB — HEMOGLOBIN A1C: HEMOGLOBIN A1C: 6.2 % — AB (ref 4.0–6.0)

## 2015-09-11 NOTE — H&P (Signed)
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 Rhabdomyolysis [M62.82] 09/07/2015  . 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 Involuntary commitment [Z04.6] 09/07/2015   History of Present Illness:   Identifying data. Mr. Louis Newton is a 30 year old male with no past psychiatric history.  Chief complaint. "I feel tired."  History of present illness. Information was obtained from the patient and the chart. The patient was transferred to psychiatry from medical floor when he was briefly hospitalized for rhabdomyolysis in what appears to be his first manic episode. A week or so prior to admission came insomnia, preoccupied with religious themes, hallucinating biblical scenes, confused and behaving strangely. He ran away from family and was running in the woods for several hours probably contributed to his elevated CPK. In the past week he also experienced some physical problems, was feverish and visit the [facility] emergency room 3 times. He also reports decreased appetite, weakness and fatigue, and weight loss. Most information is obtained from his chart and his family as the patient seems to be a poor historian today. He was initially rather confused but yesterday they seem to have a good interview with Dr. Toni Amend while on medical floor. Today he is asleep in bed, not able or unwilling to provide much information. She ate his breakfast with much encouragement this morning. She reportedly took her medications last night and slept well. At the moment he denies any symptoms of depression, anxiety, or psychosis. There is no history of alcohol or substance use.  Past  psychiatric history. He has never been treated or hospitalized. No suicide attempts.  Family psychiatric history. Nonreported.  Social history. He lives with his mother and stepfather. He is unemployed. He has a girlfriend and child.  Total Time spent with patient: 1 hour  Is the patient at risk to self? Yes.    Has the patient been a risk to self in the past 6 months? No.  Has the patient been a risk to self within the distant past? No.  Is the patient a risk to others? No.  Has the patient been a risk to others in the past 6 months? No.  Has the patient been a risk to others within the distant past? No.   Prior Inpatient Therapy:   Prior Outpatient Therapy:    Alcohol Screening: 1. How often do you have a drink containing alcohol?: Monthly or less 2. How many drinks containing alcohol do you have on a typical day when you are drinking?: 1 or 2 3. How often do you have six or more drinks on one occasion?: Never Preliminary Score: 0 4. How often during the last year have you found that you were not able to stop drinking once you had started?: Never 5. How often during the last year have you failed to do what was normally expected from you becasue of drinking?: Never 6. How often during the last year have you needed a first drink in the morning to get yourself going after a heavy drinking session?: Never 7. How often during the last year have you had a feeling of guilt of remorse after drinking?: Never 8. How often during the last year have you been unable to remember what happened  the night before because you had been drinking?: Never 9. Have you or someone else been injured as a result of your drinking?: No 10. Has a relative or friend or a doctor or another health worker been concerned about your drinking or suggested you cut down?: No Alcohol Use Disorder Identification Test Final Score (AUDIT): 1 Brief Intervention: AUDIT score less than 7 or less-screening does not suggest  unhealthy drinking-brief intervention not indicated Substance Abuse History in the last 12 months:  No. Consequences of Substance Abuse: NA Previous Psychotropic Medications: No  Psychological Evaluations: No  Past Medical History: History reviewed. No pertinent past medical history. History reviewed. No pertinent past surgical history. Family History:  Family History  Problem Relation Age of Onset  . Kidney disease Father   . Hypertension Daughter   . Epilepsy Daughter    Tobacco Screening: @FLOW ([phone])::1)@ Social History:  History  Alcohol Use  . 0.0 oz/week  . 0 Standard drinks or equivalent per week    Comment: today, 1 beer     History  Drug Use No    Additional Social History:      History of alcohol / drug use?: No history of alcohol / drug abuse                    Allergies:   Allergies  Allergen Reactions  . Bee Venom Swelling   Lab Results:  Results for orders placed or performed during the hospital encounter of 09/10/15 (from the past 48 hour(s))  Lipid panel, fasting     Status: Abnormal   Collection Time: 09/11/15  7:12 AM  Result Value Ref Range   Cholesterol 133 0 - 200 mg/dL   Triglycerides 69 <098<150 mg/dL   HDL 31 (L) >11>40 mg/dL   Total CHOL/HDL Ratio 4.3 RATIO   VLDL 14 0 - 40 mg/dL   LDL Cholesterol 88 0 - 99 mg/dL    Comment:        Total Cholesterol/HDL:CHD Risk Coronary Heart Disease Risk Table                     Men   Women  1/2 Average Risk   3.4   3.3  Average Risk       5.0   4.4  2 X Average Risk   9.6   7.1  3 X Average Risk  23.4   11.0        Use the calculated Patient Ratio above and the CHD Risk Table to determine the patient's CHD Risk.        ATP III CLASSIFICATION (LDL):  <100     mg/dL   Optimal  [id number]  mg/dL   Near or Above                    Optimal  130-159  mg/dL   Borderline  [id number]  mg/dL   High  >086>190     mg/dL   Very High   TSH     Status: None   Collection Time: 09/11/15  7:12 AM  Result  Value Ref Range   TSH 1.149 0.350 - 4.500 uIU/mL    Blood Alcohol level:  Lab Results  Component Value Date   ETH <5 09/06/2015    Metabolic Disorder Labs:  No results found for: HGBA1C, MPG No results found for: PROLACTIN Lab Results  Component Value Date   CHOL 133 09/11/2015   TRIG 69 09/11/2015  HDL 31* 09/11/2015   CHOLHDL 4.3 09/11/2015   VLDL 14 09/11/2015   LDLCALC 88 09/11/2015    Current Medications: Current Facility-Administered Medications  Medication Dose Route Frequency Provider Last Rate Last Dose  . acetaminophen (TYLENOL) tablet 650 mg  650 mg Oral Q6H PRN Nakima Fluegge B Katrianna Friesenhahn, MD      . alum & mag hydroxide-simeth (MAALOX/MYLANTA) 200-200-20 MG/5ML suspension 30 mL  30 mL Oral Q4H PRN Lelania Bia B Arzell Mcgeehan, MD      . magnesium hydroxide (MILK OF MAGNESIA) suspension 30 mL  30 mL Oral Daily PRN Zayan Delvecchio B Mykeisha Dysert, MD      . QUEtiapine (SEROQUEL XR) 24 hr tablet 300 mg  300 mg Oral QHS Junious Ragone B Maayan Jenning, MD   300 mg at 09/10/15 2148  . traZODone (DESYREL) tablet 100 mg  100 mg Oral QHS PRN Shari Prows, MD       PTA Medications: No prescriptions prior to admission    Musculoskeletal: Strength & Muscle Tone: within normal limits Gait & Station: normal Patient leans: N/A  Psychiatric Specialty Exam: I reviewed physical exam performed on medical floor and with the findings. Physical Exam  Nursing note and vitals reviewed.   Review of Systems  Constitutional: Positive for weight loss and malaise/fatigue.  Psychiatric/Behavioral: Positive for hallucinations. The patient has insomnia.   All other systems reviewed and are negative.   Blood pressure 137/79, pulse 91, temperature 98.7 F (37.1 C), temperature source Oral, resp. rate 18, height 5\' 10"  (1.778 m), weight 71.215 kg (157 lb), SpO2 99 %.Body mass index is 22.53 kg/(m^2).  See SRA.                                                  Sleep:  Number of Hours: 7.15        Treatment Plan Summary: Daily contact with patient to assess and evaluate symptoms and progress in treatment and Medication management   Mr. Charnley is a 30 year old male with no past psychiatric history admitted and presumed manic episode. He was transferred to medical floor where she was briefly hospitalized for rhabdomyolysis.  1. Mood and psychosis. He was started on Seroquel.  2. Elevated CPK. We'll continue to monitor.  3. Metabolic syndrome monitoring. Lipid profile and TSH are normal. Hemoglobin A1c and prolactin are pending.  4. Insomnia. He is on trazodone.  5. Disposition. He will be discharged to home with family. He will follow up with [facility].   Observation Level/Precautions:  15 minute checks  Laboratory:  CBC Chemistry Profile UDS UA  Psychotherapy:    Medications:    Consultations:    Discharge Concerns:    Estimated LOS:  Other:     I certify that inpatient services furnished can reasonably be expected to improve the patient's condition.    Kristine Linea, MD 7/19/20179:50 AM

## 2015-09-11 NOTE — [facility] Suicide Risk Assessment (Signed)
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 Rhabdomyolysis [M62.82] 09/07/2015  . 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 Involuntary commitment [Z04.6] 09/07/2015   Subjective Data: Confusion.  Continued Clinical Symptoms:  Alcohol Use Disorder Identification Test Final Score (AUDIT): 1 The "Alcohol Use Disorders Identification Test", Guidelines for Use in Primary Care, Second Edition.  World Science writerHealth Organization [facility](WHO). Score between 0-7:  no or low risk or alcohol related problems. Score between 8-15:  moderate risk of alcohol related problems. Score between 16-19:  high risk of alcohol related problems. Score 20 or above:  warrants further diagnostic evaluation for alcohol dependence and treatment.   CLINICAL FACTORS:   Bipolar Disorder:   Mixed State Currently Psychotic   Musculoskeletal: Strength & Muscle Tone: within normal limits Gait & Station: normal Patient leans: Backward  Psychiatric Specialty Exam: Physical Exam  Nursing note and vitals reviewed.   Review of Systems  Constitutional: Positive for weight loss and malaise/fatigue.  Psychiatric/Behavioral: Positive for hallucinations.  All other systems reviewed and are negative.   Blood pressure 137/79, pulse 91, temperature 98.7 F (37.1 C), temperature source Oral, resp. rate 18, height 5\' 10"  (1.778 m), weight 71.215 kg (157 lb), SpO2 99 %.Body mass index is 22.53 kg/(m^2).  General Appearance: Fairly Groomed  Eye Contact:  Minimal  Speech:  Slow  Volume:  Decreased  Mood:  Depressed  Affect:  Blunt   Thought Process:  Disorganized  Orientation:  Other:  To person and place only.  Thought Content:  Illogical, Delusions, Hallucinations: Auditory and Paranoid Ideation  Suicidal Thoughts:  No  Homicidal Thoughts:  No  Memory:  Immediate;   Poor Recent;   Poor Remote;   Poor  Judgement:  Impaired  Insight:  Lacking  Psychomotor Activity:  Decreased  Concentration:  Concentration: Poor and Attention Span: Poor  Recall:  Poor  Fund of Knowledge:  Poor  Language:  Poor  Akathisia:  No  Handed:  Right  AIMS (if indicated):     Assets:  Communication Skills Desire for Improvement Resilience Social Support  ADL's:  Intact  Cognition:  WNL  Sleep:  Number of Hours: 7.15      COGNITIVE FEATURES THAT CONTRIBUTE TO RISK:  None    SUICIDE RISK:   Moderate:  Frequent suicidal ideation with limited intensity, and duration, some specificity in terms of plans, no associated intent, good self-control, limited dysphoria/symptomatology, some risk factors present, and identifiable protective factors, including available and accessible social support.  PLAN OF CARE: Hospital admission, medication management, discharge planning.  Mr. Alferd PateeRichmond is a 30 year old male with no past psychiatric history admitted and presumed manic episode. He was transferred to medical floor where she was briefly hospitalized for rhabdomyolysis.  1. Mood and psychosis. He was started on Seroquel.  2. Elevated CPK. We'll continue to monitor.  3. Metabolic syndrome monitoring. Lipid profile and TSH are normal. Hemoglobin A1c and prolactin are pending.  4. Insomnia. He is on trazodone.  5. Disposition. He will be discharged to home with family. He will follow up with [facility].  I certify  that inpatient services furnished can reasonably be expected to improve the patient's condition.   Kristine Linea, MD 09/11/2015, 9:43 AM

## 2015-09-11 NOTE — [facility] Group Notes (Signed)
[facility] Group Notes:  (Nursing/MHT/Case Management/Adjunct)  Date:  09/11/2015  Time:  3:54 PM  Type of Therapy:  Psychoeducational Skills  Participation Level:  Active  Participation Quality:  Appropriate  Affect:  Appropriate  Cognitive:  Appropriate  Insight:  Appropriate  Engagement in Group:  Engaged  Modes of Intervention:  Activity  Summary of Progress/Problems:  Louis NeerJackie L Aizen Duval 09/11/2015, 3:54 PM

## 2015-09-11 NOTE — Progress Notes (Signed)
Patient is calm & cooperative.Denies depression & suicidal ideations.Stated that he feels tired today.Stayed in bed most of the time.Appropriate with staff & peers.Attended groups.CT of head done.

## 2015-09-11 NOTE — Progress Notes (Signed)
Calm and cooperative. Pt remains confused.  States he can't understand why he's here.  Patient on the phone during the shift talking with his brother who encouraged pt take his medications. initial refused po bedtime medications. Clinical support provided. Denies SI/HI. Endorse visual hallucinations. No behavior issues noted. q 15 min checks maintained for safety. No c/o pain/discomfort noted.

## 2015-09-11 NOTE — Plan of Care (Signed)
Problem: Coping: Goal: Ability to verbalize frustrations and anger appropriately will improve Outcome: Not Met (add Reason) AAOX4. Anxious and suspicious. Med compliant. No PRNs given. S/e and adverse reactions discussed. Questions encouraged. Attends group. No behavior issues noted. q 15 mins checks maintained for safety.

## 2015-09-12 LAB — PROLACTIN: Prolactin: 18.4 ng/mL — ABNORMAL HIGH (ref 4.0–15.2)

## 2015-09-12 MED ORDER — QUETIAPINE FUMARATE ER 300 MG PO TB24: 300.0000 mg | ORAL_TABLET | Freq: Every day | ORAL | Status: DC

## 2015-09-12 NOTE — [facility] Suicide Risk Assessment (Signed)
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 Rhabdomyolysis [M62.82] 09/07/2015  . 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 Involuntary commitment [Z04.6] 09/07/2015    Total Time spent with patient: 30 minutes  Musculoskeletal: Strength & Muscle Tone: within normal limits Gait & Station: normal Patient leans: N/A  Psychiatric Specialty Exam: Review of Systems  All other systems reviewed and are negative.   Blood pressure 115/78, pulse 88, temperature 98.5 F (36.9 C), temperature source Oral, resp. rate 18, height 5\' 10"  (1.778 m), weight 71.215 kg (157 lb), SpO2 99 %.Body mass index is 22.53 kg/(m^2).  General Appearance: Casual  Eye Contact::  Good  Speech:  Clear and Coherent409  Volume:  Normal  Mood:  Euthymic  Affect:  Appropriate  Thought Process:  Goal Directed  Orientation:  Full (Time, Place, and Person)  Thought Content:  WDL  Suicidal Thoughts:  No  Homicidal Thoughts:  No  Memory:  Immediate;   Fair Recent;   Fair Remote;   Fair  Judgement:  Impaired  Insight:  Shallow  Psychomotor Activity:  Normal  Concentration:  Fair  Recall:  [employer]  Fund of Knowledge:Fair  Language: Fair  Akathisia:  No  Handed:  Right  AIMS (if indicated):     Assets:  Communication Skills Desire for Improvement Housing Physical Health Resilience Social Support  Sleep:  Number of Hours: 6.5  Cognition: WNL  ADL's:  Intact   Mental Status Per Nursing Assessment::   On Admission:     Demographic Factors:  Male and Unemployed  Loss Factors: NA  Historical Factors: Impulsivity  Risk Reduction Factors:   Sense of responsibility to family, Living with another person, especially a relative and Positive social support  Continued Clinical Symptoms:  Bipolar Disorder:   Mixed State  Cognitive Features  That Contribute To Risk:  None    Suicide Risk:  Minimal: No identifiable suicidal ideation.  Patients presenting with no risk factors but with morbid ruminations; may be classified as minimal risk based on the severity of the depressive symptoms    Plan Of Care/Follow-up recommendations:  Activity:  As tolerated. Diet:  Regular. Other:  Keep follow-up appointments.  Kristine LineaJolanta Pucilowska, MD 09/12/2015, 9:06 AM

## 2015-09-12 NOTE — [facility] Group Notes (Signed)
[facility] LCSW Group Therapy  09/12/2015 1:21 PM  Type of Therapy:  Group Therapy  Participation Level:  Did Not Attend  Modes of Intervention:  Discussion, Education, Socialization and Support  Summary of Progress/Problems: Balance in life: Patients will discuss the concept of balance and how it looks and feels to be unbalanced. Pt will identify areas in their life that is unbalanced and ways to become more balanced.    Waylin Dorko L Danelia Snodgrass MSW, LCSWA  09/12/2015, 1:21 PM   

## 2015-09-12 NOTE — Progress Notes (Signed)
D: Pt denies SI/HI/AVH. Pt is pleasant and cooperative with treatment plan. Pt appears less anxious and he is interacting with peers and staff appropriately.  A: Pt was offered support and encouragement. Pt was given scheduled medications. Pt was encouraged to attend groups. Q 15 minute checks were done for safety.  R:Pt did not attend group. Patient interacts with  peers and staff appropriately. Pt is taking medication. Pt has no complaints.Pt receptive to treatment and safety maintained on unit.

## 2015-09-12 NOTE — Tx Team (Signed)
Interdisciplinary Treatment Plan Update (Adult)  Date:  09/12/2015 Time Reviewed:  12:01 PM  Progress in Treatment: Attending groups: Yes. Participating in groups:  Yes. Taking medication as prescribed:  Yes. Tolerating medication:  Yes. Family/Significant othe contact made:  No, will contact:  CSW has attempted to contact mother  Patient understands diagnosis:  No. and As evidenced by:  limited insight  Discussing patient identified problems/goals with staff:  Yes. Medical problems stabilized or resolved:  Yes. Denies suicidal/homicidal ideation: Yes. Issues/concerns per patient self-inventory:  Yes. Other:  New problem(s) identified: No, Describe:  NA  Discharge Plan or Barriers: Pt plans to return home and follow up with outpatient.    Reason for Continuation of Hospitalization: Delusions  Hallucinations Mania Medication stabilization  Comments:  History of present illness. Information was obtained from the patient and the chart. The patient was transferred to psychiatry from medical floor when he was briefly hospitalized for rhabdomyolysis in what appears to be his first manic episode. A week or so prior to admission came insomnia, preoccupied with religious themes, hallucinating biblical scenes, confused and behaving strangely. He ran away from family and was running in the woods for several hours   Estimated length of stay: Pt will likely d/c today.   New goal(s): NA  Review of initial/current patient goals per problem list:   1.  Goal(s): Patient will participate in aftercare plan * Met: Yes * Target date: at discharge * As evidenced by: Patient will participate within aftercare plan AEB aftercare provider and housing plan at discharge being identified.  2.  Goal (s): Patient will demonstrate decreased symptoms of mania. * Met: Yes *  Target date: at discharge * As evidenced by: Patient will not endorse signs of mania or be deemed stable for discharge by MD.    Attendees: Patient:  Louis Newton 7/20/201712:01 PM  Family:   7/20/201712:01 PM  Physician:   Dr. Bary Leriche  7/20/201712:01 PM  Nursing:   Elige Radon, RN  7/20/201712:01 PM  Case Manager:   7/20/201712:01 PM  Counselor:   7/20/201712:01 PM  Other:  Wray Kearns, LCSWA 7/20/201712:01 PM  Other:  Anthoney Harada, [place] 7/20/201712:01 PM  Other:   7/20/201712:01 PM  Other:  7/20/201712:01 PM  Other:  7/20/201712:01 PM  Other:  7/20/201712:01 PM  Other:  7/20/201712:01 PM  Other:  7/20/201712:01 PM  Other:  7/20/201712:01 PM  Other:   7/20/201712:01 PM   Scribe for Treatment Team:   Wray Kearns .MSW, LCSWA , 09/12/2015, 12:01 PM

## 2015-09-12 NOTE — Progress Notes (Signed)
D: Pt denies SI/HI/AVH. Pt is pleasant and cooperative, affect is flat, he appears less anxious and he is interacting with peers and staff appropriately.  A: Pt was offered support and encouragement. Pt was given scheduled medications. Pt was encouraged to attend groups. Q 15 minute checks were done for safety.  R:Pt attends groups and interacts well with peers and staff. Pt is taking medication. Pt has no complaints.Pt receptive to treatment and safety maintained on unit.

## 2015-09-12 NOTE — Progress Notes (Signed)
  [facility] :  Will you be returning to the same living situation after discharge:  Yes,  home  At discharge, do you have transportation home?: Yes,  family  Do you [facility] the ability to [facility] your [facility]: [facility]  Release of information consent forms completed and in the chart;  Patient's signature needed at discharge.  Patient to Follow up at: Follow-up Information    [facility] in [facility] day.   Why:  Your hospital follow up appointment will be walk in. Walk in hours are Monday through Friday 8:00am- 10:00am.    Contact information:   [address] [facility] [place] [id number] [phone]       Next level of care provider [facility] Link:no  Safety Planning and Suicide Prevention discussed: Yes,  with patient   Have you used any form of tobacco in the last 30 days? (Cigarettes, Smokeless Tobacco, Cigars, and/or Pipes): No  Has patient been referred to the Quitline?: N/A patient is not a smoker  Patient has been referred for addiction treatment: N/A  Rondall Allegraandace L Taesha Goodell MSW, LCSWA  09/12/2015, 11:59 AM

## 2015-09-12 NOTE — [facility] Counselor (Signed)
Adult Comprehensive Assessment  Patient ID: Louis Newton, male   DOB: 1985-09-16, 30 y.o.   MRN: [id number]  Information Source: Information source: Patient  Current Stressors:  Educational / Learning stressors: None reported Employment / Job issues: Pt is unemployed.  Family Relationships: None reported.  Financial / Lack of resources (include bankruptcy): No income.  Housing / Lack of housing: Pt lives with mother.  Physical health (include injuries & life threatening diseases): None reported,  Social relationships: None reported  Substance abuse: None reported  Bereavement / Loss: None reported   Living/Environment/Situation:  Living Arrangements: Parent Living conditions (as described by patient or guardian): "I love it"  How long has patient lived in current situation?: "since I was 30 years old"  What is atmosphere in current home: Comfortable, Paramedic, Supportive  Family History:  Marital status: Single Are you sexually active?: Yes What is your sexual orientation?: Heterosexual  Has your sexual activity been affected by drugs, alcohol, medication, or emotional stress?: None reported  Does patient have children?: No  Childhood History:  By whom was/is the patient raised?: Mother, Grandparents Description of patient's relationship with caregiver when they were a child: Pt reports mother worked a lot so his grandparents cared for him. Close relationship with family.  Patient's description of current relationship with people who raised him/her: Close relationship.  How were you disciplined when you got in trouble as a child/adolescent?: None reported  Does patient have siblings?: Yes Number of Siblings: 3 Description of patient's current relationship with siblings: 2 brothers, 1 sister; close relationship.  Did patient suffer any verbal/emotional/physical/sexual abuse as a child?: No Did patient suffer from severe childhood neglect?: No Has patient ever been sexually  abused/assaulted/raped as an adolescent or adult?: No Was the patient ever a victim of a crime or a disaster?: No Witnessed domestic violence?: No Has patient been effected by domestic violence as an adult?: No  Education:  Highest grade of school patient has completed: Some college  Currently a Consulting civil engineer?: No Learning disability?: No  Employment/Work Situation:   Employment situation: Unemployed Patient's job has been impacted by current illness: No What is the longest time patient has a held a job?: 5 years  Where was the patient employed at that time?: [employer] company Has patient ever been in the [employer]?: No  Financial Resources:   Financial resources: No income Does patient have a Lawyer or guardian?: No  Alcohol/Substance Abuse:   What has been your use of drugs/alcohol within the last 12 months?: Denies use.  Alcohol/Substance Abuse Treatment Hx: Denies past history Has alcohol/substance abuse ever caused legal problems?: No  Social Support System:   Patient's Community Support System: Good Describe Community Support System: family  Type of faith/religion: Christainty.  How does patient's faith help to cope with current illness?: " I want to go to heaven so I do the right thing."   Leisure/Recreation:   Leisure and Hobbies: washing cars  Strengths/Needs:   What things does the patient do well?: fixing things  In what areas does patient struggle / problems for patient: Pt is unable to answer   Discharge Plan:   Does patient have access to transportation?: Yes Will patient be returning to same living situation after discharge?: Yes Currently receiving community mental health services: No If no, would patient like referral for services when discharged?: Yes (What [place] ) Does patient have financial barriers related to discharge medications?: Yes Patient description of barriers related to discharge medications: No insurance,  no income    Summary/Recommendations:    Patient is a 30 year old male admitted  with a diagnosis of Brief Reactive Psychosis. Patient presented to the hospital with delusions and hallucinations. Patient will benefit from crisis stabilization, medication evaluation, group therapy and psycho education in addition to case management for discharge. At discharge, it is recommended that patient remain compliant with established discharge plan and continued treatment.   Jerrian Mells L Sabrina Arriaga.MSW, LCSWA  09/12/2015

## 2015-09-12 NOTE — [facility] Suicide Risk Assessment (Signed)
[facility] INPATIENT:  Family/Significant Other Suicide Prevention Education  Suicide Prevention Education:  Contact Attempts:Ella Cherly HensenCousins (mother) [phone] has been identified by the patient as the family member/significant other with whom the patient will be residing, and identified as the person(s) who will aid the patient in the event of a mental health crisis.  With written consent from the patient, two attempts were made to provide suicide prevention education, prior to and/or following the patient's discharge.  We were unsuccessful in providing suicide prevention education.  A suicide education pamphlet was given to the patient to share with family/significant other. CSW left voicemail requesting call back.   Date and time of first attempt:09/12/2015 10:00am  Date and time of second attempt:09/12/2015  11:45 am   Rondall AllegraCandace L Sissy Goetzke MSW, LCSWA  09/12/2015, 11:56 AM

## 2015-09-12 NOTE — Discharge Summary (Signed)
Physician Discharge Summary Note  Patient:  Louis Newton is an 30 y.o., male MRN:  [id number] DOB:  1985/05/25 Patient phone:  [phone] (home)  Patient address:   [address] [place] [address],  Total Time spent with patient: 30 minutes  Date of Admission:  09/10/2015 Date of Discharge: 09/12/2015  Reason for Admission:  Psychosis.  Identifying data. Louis Newton is a 30 year old male with no past psychiatric history.  Chief complaint. "I feel tired."  History of present illness. Information was obtained from the patient and the chart. The patient was transferred to psychiatry from medical floor when he was briefly hospitalized for rhabdomyolysis in what appears to be his first manic episode. A week or so prior to admission came insomnia, preoccupied with religious themes, hallucinating biblical scenes, confused and behaving strangely. He ran away from family and was running in the woods for several hours probably contributed to his elevated CPK. In the past week he also experienced some physical problems, was feverish and visit the [facility] emergency room 3 times. He also reports decreased appetite, weakness and fatigue, and weight loss. Most information is obtained from his chart and his family as the patient seems to be a poor historian today. He was initially rather confused but yesterday they seem to have a good interview with Dr. Toni Amend while on medical floor. Today he is asleep in bed, not able or unwilling to provide much information. She ate his breakfast with much encouragement this morning. She reportedly took her medications last night and slept well. At the moment he denies any symptoms of depression, anxiety, or psychosis. There is no history of alcohol or substance use.  Past psychiatric history. He has never been treated or hospitalized. No suicide attempts.  Family psychiatric history. Nonreported.  Social history. He lives with his mother and stepfather. He is  unemployed. He has a girlfriend and child.  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 Rhabdomyolysis [M62.82] 09/07/2015  . 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 Involuntary commitment [Z04.6] 09/07/2015    Past Medical History: History reviewed. No pertinent past medical history. History reviewed. No pertinent past surgical history. Family History:  Family History  Problem Relation Age of Onset  . Kidney disease Father   . Hypertension Daughter   . Epilepsy Daughter    Social History:  History  Alcohol Use  . 0.0 oz/week  . 0 Standard drinks or equivalent per week    Comment: today, 1 beer     History  Drug Use No    Social History   Social History  . Marital Status: Single    Spouse Name: N/A  . Number of Children: N/A  . Years of Education: N/A   Social History Main Topics  . Smoking status: Never Smoker   . Smokeless tobacco: Never Used  . Alcohol Use: 0.0 oz/week    0 Standard drinks or equivalent per week     Comment: today, 1 beer  . Drug Use: No  . Sexual Activity: Yes    Birth Control/ Protection: None   Other Topics Concern  . None   Social History Narrative    Hospital Course:    Louis Newton is a 30 year old male with no past psychiatric history admitted and presumed manic episode. He was transferred to medical floor where she was briefly hospitalized for rhabdomyolysis.  1. Mood and  psychosis. He was started on Seroquel. Head CT scan did not reveal any abnormalities.  2. Elevated CPK. Resolved.  3. Metabolic syndrome monitoring. Lipid profile, TSH and Hemoglobin A1c are normal. Prolactin 11.9.   4. Insomnia. He was on trazodone.  5. Disposition. He was discharged to home with family. He will follow up with [facility].  Physical Findings: AIMS:  , ,  ,  , Dental Status Current problems with teeth and/or dentures?:  No Does patient usually wear dentures?: No  CIWA:    COWS:     Musculoskeletal: Strength & Muscle Tone: within normal limits Gait & Station: normal Patient leans: N/A  Psychiatric Specialty Exam: Physical Exam  Nursing note and vitals reviewed.   Review of Systems  All other systems reviewed and are negative.   Blood pressure 115/78, pulse 88, temperature 98.5 F (36.9 C), temperature source Oral, resp. rate 18, height 5\' 10"  (1.778 m), weight 71.215 kg (157 lb), SpO2 99 %.Body mass index is 22.53 kg/(m^2).  See SRA.                                                  Sleep:  Number of Hours: 6.5     Have you used any form of tobacco in the last 30 days? (Cigarettes, Smokeless Tobacco, Cigars, and/or Pipes): No  Has this patient used any form of tobacco in the last 30 days? (Cigarettes, Smokeless Tobacco, Cigars, and/or Pipes) Yes, No  Blood Alcohol level:  Lab Results  Component Value Date   ETH <5 09/06/2015    Metabolic Disorder Labs:  Lab Results  Component Value Date   HGBA1C 6.2* 09/11/2015   Lab Results  Component Value Date   PROLACTIN 18.4* 09/11/2015   Lab Results  Component Value Date   CHOL 133 09/11/2015   TRIG 69 09/11/2015   HDL 31* 09/11/2015   CHOLHDL 4.3 09/11/2015   VLDL 14 09/11/2015   LDLCALC 88 09/11/2015    See Psychiatric Specialty Exam and Suicide Risk Assessment completed by Attending Physician prior to discharge.  Discharge destination:  Home  Is patient on multiple antipsychotic therapies at discharge:  No   Has Patient had three or more failed trials of antipsychotic monotherapy by history:  No  Recommended Plan for Multiple Antipsychotic Therapies: NA  Discharge Instructions    Diet - low sodium heart healthy    Complete by:  As directed      Increase activity slowly    Complete by:  As directed             Medication List    TAKE these medications      Indication   QUEtiapine 300 MG 24 hr  tablet  Commonly known as:  SEROQUEL XR  Take 1 tablet (300 mg total) by mouth at bedtime.   Indication:  Manic-Depression         Follow-up recommendations:  Activity:  As tolerated. Diet:  Regular. Other:  Keep follow-up appointments.  Comments:    Signed: Kristine LineaJolanta Rejeana Fadness, MD 09/12/2015, 9:09 AM

## 2015-09-12 NOTE — Progress Notes (Signed)
Patient with appropriate affect, cooperative behavior with meals and plan of care. No SI/HI at this time. Meets with MD and to discharge today when discharge plan and transportation in place. Safety maintained.

## 2015-09-25 ENCOUNTER — Ambulatory Visit: Payer: Self-pay

## 2016-04-01 ENCOUNTER — Ambulatory Visit (INDEPENDENT_AMBULATORY_CARE_PROVIDER_SITE_OTHER): Payer: Self-pay | Admitting: Family Medicine

## 2016-04-01 ENCOUNTER — Encounter: Payer: Self-pay | Admitting: Family Medicine

## 2016-04-01 VITALS — BP 116/78 | HR 84 | Temp 98.8°F | Resp 16 | Wt 181.2 lb

## 2016-04-01 DIAGNOSIS — F304 Manic episode in full remission: Secondary | ICD-10-CM | POA: Insufficient documentation

## 2016-04-01 DIAGNOSIS — R112 Nausea with vomiting, unspecified: Secondary | ICD-10-CM

## 2016-04-01 DIAGNOSIS — R1030 Lower abdominal pain, unspecified: Secondary | ICD-10-CM

## 2016-04-01 DIAGNOSIS — R6883 Chills (without fever): Secondary | ICD-10-CM

## 2016-04-01 LAB — POCT URINALYSIS DIPSTICK
Glucose, UA: NEGATIVE
Ketones, UA: NEGATIVE
Leukocytes, UA: NEGATIVE
NITRITE UA: NEGATIVE
PH UA: 6.5
Protein, UA: 30
RBC UA: NEGATIVE
SPEC GRAV UA: 1.02
UROBILINOGEN UA: 1

## 2016-04-01 NOTE — Progress Notes (Signed)
BP 116/78   Pulse 84   Temp 98.8 F (37.1 C) (Oral)   Resp 16   Wt 181 lb 3 oz (82.2 kg)   SpO2 97%   BMI 26.00 kg/m    Subjective:    Patient ID: Louis Newton, male    DOB: June 09, 1985, 31 y.o.   MRN: [id number]  HPI: Louis Newton is a 31 y.o. male  Chief Complaint  Patient presents with  . Abdominal Pain    nausea and vomitting last wenesday morning.    Patient is here unaccompanied He was vomiting last week and wants to get checked out He went to [facility] and then went to [employer]; then watching a movie and got sick and wanted to get checked out Thought it was stomach flu Father in law was sick two days prior Patient had vomting every time he ate something; started at 5 am and then all day long; this was Wed one week ago Just the one day Diarrhea but no blood Had chills but no fever Got nervous about getting sick again Had plenty of fluids all that day; gatorade and ginger ale, Jell-O and soup came back up that day No recent travel; not out of the country Dark urine today, kind of like when he was in the hospital Having some pain in the genital area and top of the stomach; no penile discharge, no swollen lymph nodes in the groin; no new sexual partners No burning with urination; urine stream not as strong Currently, no grandiose thoughts, no thoughts of SI/HI; did have trouble falling asleep this past week and took benadryl 50 mg and that took care of it; I asked about hours of sleep; he estimates sleeping from 10 pm to 5:30 or 6 am; regular for him Was doing well during Christmas Patient was on seroquel, but he stopped that back in October; he decided to just stop on his own The seroquel made him sick on his stomach, gave him constipation He was seeing psychiatrist [facility] on [address] He was just taking way too much; did not feel anything Hard constipation with atypical antipsychotic Had been to [facility] ER 3 times last summer prior to admission here at [facility]  here He does recall that the doctor at [facility] told him that he was manic, and the medicine would help; he couldn't promise that he wouldn't have another manic episode Patient again denies any religious thoughts, grandiose thoughts, denies hallucinations, denies SI/HI  Depression screen [facility] 2/9 04/01/2016 09/05/2015  Decreased Interest 0 0  Down, Depressed, Hopeless 0 1  PHQ - 2 Score 0 1   Relevant past medical, surgical, family and social history reviewed Past Medical History:  Diagnosis Date  . 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 Rhabdomyolysis 09/07/2015    History reviewed. No pertinent surgical history.   Family History  Problem Relation Age of Onset  . Kidney disease Father   . Hypertension Daughter   . Epilepsy Daughter    Social History  Substance Use Topics  . Smoking status: Never Smoker  . Smokeless tobacco: Never Used  . Alcohol use 0.0 oz/week     Comment: today, 1 beer   Interim medical history since last visit reviewed. Allergies and medications reviewed  Review of Systems  Psychiatric/Behavioral: Positive for sleep disturbance (just the one night). Negative for agitation, behavioral problems, confusion, decreased concentration, dysphoric mood, hallucinations, self-injury and suicidal ideas. The patient is nervous/anxious (little bit associated with health). The  patient is not hyperactive.   Per HPI unless specifically indicated above     Objective:    BP 116/78   Pulse 84   Temp 98.8 F (37.1 C) (Oral)   Resp 16   Wt 181 lb 3 oz (82.2 kg)   SpO2 97%   BMI 26.00 kg/m   Wt Readings from Last 3 Encounters:  04/01/16 181 lb 3 oz (82.2 kg)  09/10/15 157 lb (71.2 kg)  09/09/15 162 lb (73.5 kg)    Physical Exam  Constitutional: He appears well-developed and well-nourished. No distress.  Cardiovascular: Normal rate and regular rhythm.   Pulmonary/Chest: Effort normal and breath sounds normal.  Abdominal: Soft. Bowel sounds are  normal. He exhibits no distension. There is no tenderness. There is no guarding.  Genitourinary: Testes normal and penis normal. Right testis shows no mass, no swelling and no tenderness. Right testis is descended. Left testis shows no mass, no swelling and no tenderness. Left testis is descended. No phimosis, penile erythema or penile tenderness. No discharge found.  Lymphadenopathy:       Right: No inguinal adenopathy present.       Left: No inguinal adenopathy present.  Skin: No rash noted. He is not diaphoretic. No pallor.  Psychiatric: He has a normal mood and affect. His behavior is normal. Judgment and thought content normal.   Results for orders placed or performed in visit on 04/01/16  POCT urinalysis dipstick  Result Value Ref Range   Color, UA yellow    Clarity, UA clear    Glucose, UA neg    Bilirubin, UA small    Ketones, UA neg    Spec Grav, UA 1.020    Blood, UA neg    pH, UA 6.5    Protein, UA 30    Urobilinogen, UA 1.0    Nitrite, UA neg    Leukocytes, UA Negative Negative      Assessment & Plan:   Problem List Items Addressed This Visit      Other   Manic disorder, full remission (HCC)    Explained to patient that I really urge him to get established back with [facility]; while he does not sound to be in another manic episode, I would like him to talk to psychiatrist; there are other medicines available if the seroquel did not work well for him; he agrees to get back in with psych, though he did not sound excited; I will see him back on Monday to make sure he is not accelerating into a manic phase, and I urged him to seek medical help if symptoms worsen over the weekend; he agrees       Other Visit Diagnoses    Lower abdominal pain    -  Primary   Relevant Orders   POCT urinalysis dipstick (Completed)   CBC with Differential/Platelet   COMPLETE METABOLIC PANEL WITH GFR   CK (Creatine Kinase)   Myoglobin, serum   Chills       Non-intractable vomiting with nausea,  unspecified vomiting type       I believe he had a bout of acute gastroenteritis last week; lasted ~24 hours; completely resolved; will check labs to ensure no dehydration       Follow up plan: Return in about 5 days (around 04/06/2016) for follow-up.  An after-visit summary was printed and given to the patient at check-out.  Please see the patient instructions which may contain other information and recommendations beyond what is mentioned above  in the assessment and plan.  No orders of the defined types were placed in this encounter.   Orders Placed This Encounter  Procedures  . CBC with Differential/Platelet  . COMPLETE METABOLIC PANEL WITH GFR  . CK (Creatine Kinase)  . Myoglobin, serum  . POCT urinalysis dipstick

## 2016-04-01 NOTE — Patient Instructions (Signed)
Stay well-hydrated We'll contact you about the labs Please do contact [facility] and get back in with them; let them know about what you've been dealing this week Talk to them about different medicines

## 2016-04-01 NOTE — Assessment & Plan Note (Signed)
Explained to patient that I really urge him to get established back with [facility]; while he does not sound to be in another manic episode, I would like him to talk to psychiatrist; there are other medicines available if the seroquel did not work well for him; he agrees to get back in with psych, though he did not sound excited; I will see him back on Monday to make sure he is not accelerating into a manic phase, and I urged him to seek medical help if symptoms worsen over the weekend; he agrees

## 2016-04-02 ENCOUNTER — Emergency Department
Admission: EM | Admit: 2016-04-02 | Discharge: 2016-04-02 | Disposition: A | Discharge status: Home / Self Care | Payer: Self-pay | Attending: Emergency Medicine | Admitting: Emergency Medicine

## 2016-04-02 ENCOUNTER — Encounter: Payer: Self-pay | Admitting: Emergency Medicine

## 2016-04-02 ENCOUNTER — Telehealth: Payer: Self-pay

## 2016-04-02 DIAGNOSIS — A084 Viral intestinal infection, unspecified: Secondary | ICD-10-CM | POA: Insufficient documentation

## 2016-04-02 LAB — COMPREHENSIVE METABOLIC PANEL
ALBUMIN: 4.6 g/dL (ref 3.5–5.0)
ALT: 21 U/L (ref 17–63)
ANION GAP: 9 (ref 5–15)
AST: 22 U/L (ref 15–41)
Alkaline Phosphatase: 67 U/L (ref 38–126)
BILIRUBIN TOTAL: 1 mg/dL (ref 0.3–1.2)
BUN: 9 mg/dL (ref 6–20)
CO2: 25 mmol/L (ref 22–32)
Calcium: 9.9 mg/dL (ref 8.9–10.3)
Chloride: 104 mmol/L (ref 101–111)
Creatinine, Ser: 1.23 mg/dL (ref 0.61–1.24)
GFR calc Af Amer: >60 mL/min (ref >60)
GFR calc non Af Amer: >60 mL/min (ref >60)
GLUCOSE: 103 mg/dL — AB (ref 65–99)
POTASSIUM: 3.7 mmol/L (ref 3.5–5.1)
SODIUM: 138 mmol/L (ref 135–145)
TOTAL PROTEIN: 8.1 g/dL (ref 6.5–8.1)

## 2016-04-02 LAB — COMPLETE METABOLIC PANEL WITH GFR
ALBUMIN: 4.5 g/dL (ref 3.6–5.1)
ALK PHOS: 71 U/L (ref 40–115)
ALT: 20 U/L (ref 9–46)
AST: 20 U/L (ref 10–40)
BUN: 11 mg/dL (ref 7–25)
CALCIUM: 9.8 mg/dL (ref 8.6–10.3)
CO2: 25 mmol/L (ref 20–31)
Chloride: 104 mmol/L (ref 98–110)
Creat: 1.24 mg/dL (ref 0.60–1.35)
GFR, Est African American: 88 mL/min (ref >=60)
GFR, Est Non African American: 76 mL/min (ref >=60)
Glucose, Bld: 97 mg/dL (ref 65–99)
POTASSIUM: 3.9 mmol/L (ref 3.5–5.3)
Sodium: 143 mmol/L (ref 135–146)
Total Bilirubin: 0.6 mg/dL (ref 0.2–1.2)
Total Protein: 7.5 g/dL (ref 6.1–8.1)

## 2016-04-02 LAB — CBC WITH DIFFERENTIAL/PLATELET
BASOS ABS: 0 {cells}/uL (ref 0–200)
Basophils Relative: 0 %
EOS PCT: 3 %
Eosinophils Absolute: 246 cells/uL (ref 15–500)
HCT: 44.5 % (ref 38.5–50.0)
HEMOGLOBIN: 15.1 g/dL (ref 13.2–17.1)
LYMPHS ABS: 3116 {cells}/uL (ref 850–3900)
Lymphocytes Relative: 38 %
MCH: 27 pg (ref 27.0–33.0)
MCHC: 33.9 g/dL (ref 32.0–36.0)
MCV: 79.6 fL — ABNORMAL LOW (ref 80.0–100.0)
MPV: 9.9 fL (ref 7.5–12.5)
Monocytes Absolute: 656 cells/uL (ref 200–950)
Monocytes Relative: 8 %
NEUTROS PCT: 51 %
Neutro Abs: 4182 cells/uL (ref 1500–7800)
Platelets: 293 10*3/uL (ref 140–400)
RBC: 5.59 MIL/uL (ref 4.20–5.80)
RDW: 13.9 % (ref 11.0–15.0)
WBC: 8.2 10*3/uL (ref 3.8–10.8)

## 2016-04-02 LAB — URINALYSIS, COMPLETE (UACMP) WITH MICROSCOPIC
BACTERIA UA: NONE SEEN
BILIRUBIN URINE: NEGATIVE
Glucose, UA: NEGATIVE mg/dL
Hgb urine dipstick: NEGATIVE
KETONES UR: NEGATIVE mg/dL
Leukocytes, UA: NEGATIVE
NITRITE: NEGATIVE
PROTEIN: NEGATIVE mg/dL
RBC / HPF: NONE SEEN RBC/hpf (ref 0–5)
SPECIFIC GRAVITY, URINE: 1.001 — AB (ref 1.005–1.030)
Squamous Epithelial / LPF: NONE SEEN
WBC UA: NONE SEEN WBC/hpf (ref 0–5)
pH: 7 (ref 5.0–8.0)

## 2016-04-02 LAB — CBC
HEMATOCRIT: 44.9 % (ref 40.0–52.0)
HEMOGLOBIN: 15 g/dL (ref 13.0–18.0)
MCH: 26.8 pg (ref 26.0–34.0)
MCHC: 33.5 g/dL (ref 32.0–36.0)
MCV: 80 fL (ref 80.0–100.0)
Platelets: 279 10*3/uL (ref 150–440)
RBC: 5.61 MIL/uL (ref 4.40–5.90)
RDW: 12.7 % (ref 11.5–14.5)
WBC: 9.3 10*3/uL (ref 3.8–10.6)

## 2016-04-02 LAB — LIPASE, BLOOD: Lipase: 21 U/L (ref 11–51)

## 2016-04-02 LAB — GLUCOSE, CAPILLARY: GLUCOSE-CAPILLARY: 89 mg/dL (ref 65–99)

## 2016-04-02 LAB — CK: Total CK: 212 U/L (ref 7–232)

## 2016-04-02 MED ORDER — ONDANSETRON HCL 4 MG PO TABS: 4.0000 mg | ORAL_TABLET | Freq: Three times a day (TID) | ORAL | 0 refills | Status: DC | PRN

## 2016-04-02 NOTE — Telephone Encounter (Signed)
Patient notified, states has an appt with them tomorrow.

## 2016-04-02 NOTE — Telephone Encounter (Signed)
He really needs to contact his psychiatrist for this; I'm worried that he may be starting to go through what he went through last summer; he absolutely needs to contact his psychiatrist as soon as he gets off of the phone with you. If he gets thoughts of grandiosity, religiosity, trouble sleeping, anxiety, etc, then he needs to go to the ER

## 2016-04-02 NOTE — ED Provider Notes (Signed)
[facility] Emergency Department Provider Note  ____________________________________________  Time seen: Approximately 5:49 PM  I have reviewed the triage vital signs and the nursing notes.   HISTORY  Chief Complaint No chief complaint on file.   HPI Louis Newton is a 31 y.o. male the history of bipolar disorder who presents for evaluation of decreased appetite and anxiety. Patient reports that week ago he had 1 day of multiple episodes of nonbloody nonbilious emesis. 2 days later he started having diarrhea and reports that he continues to have watery nonbloody diarrhea 2-3 bowel movements a day. He has had lower abdominal cramping that has been intermittent and mild at this time. He saw his PCP yesterday with blood work that wasn't revealing. He comes back today because he hasn't been able to eat due to nausea has been going on for a week. Patient tells me that he has a lot of anxiety and every time he gets sick he thinks the worse and is afraid that he might get something really bad. He denies feeling depressed or manic at this time. He denies suicidal or homicidal ideation. He denies fever or chills, chest pain or shortness of breath, urinary symptoms.  Past Medical History:  Diagnosis Date  . 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 Involuntary commitment 09/07/2015    History reviewed. No pertinent surgical history.  Prior to Admission medications   Medication Sig Start Date End Date Taking? Authorizing Provider  ondansetron (ZOFRAN) 4 MG tablet Take 1 tablet (4 mg total) by mouth every 8 (eight) hours as needed for nausea or vomiting. 04/02/16   Nita Sickle, MD    Allergies Bee venom  Family  History  Problem Relation Age of Onset  . Kidney disease Father   . Hypertension Daughter   . Epilepsy Daughter     Social History Social History  Substance Use Topics  . Smoking status: Never Smoker  . Smokeless tobacco: Never Used  . Alcohol use 0.0 oz/week     Comment: today, 1 beer    Review of Systems  Constitutional: Negative for fever. + anxiety Eyes: Negative for visual changes. ENT: Negative for sore throat. Neck: No neck pain  Cardiovascular: Negative for chest pain. Respiratory: Negative for shortness of breath. Gastrointestinal: + lower abdominal cramping, vomiting, diarrhea, and nausea Genitourinary: Negative for dysuria. Musculoskeletal: Negative for back pain. Skin: Negative for rash. Neurological: Negative for headaches, weakness or numbness. Psych: No SI or HI  ____________________________________________   PHYSICAL EXAM:  VITAL SIGNS: ED Triage Vitals  Enc Vitals Group     BP 04/02/16 1552 (!) 164/90     Pulse Rate 04/02/16 1552 92     Resp 04/02/16 1552 18     Temp 04/02/16 1552 98.2 F (36.8 C)     Temp Source 04/02/16 1552 Oral     SpO2 04/02/16 1552 98 %     Weight 04/02/16 1552 180 lb (81.6 kg)     Height 04/02/16 1552 5\' 9"  (1.753 m)     Head Circumference --      Peak Flow --      Pain Score 04/02/16 1553 8     Pain Loc --      Pain Edu? --  Excl. in GC? --     Constitutional: Alert and oriented. Well appearing and in no apparent distress. HEENT:      Head: Normocephalic and atraumatic.         Eyes: Conjunctivae are normal. Sclera is non-icteric. EOMI. PERRL      Mouth/Throat: Mucous membranes are moist.       Neck: Supple with no signs of meningismus. Cardiovascular: Regular rate and rhythm. No murmurs, gallops, or rubs. 2+ symmetrical distal pulses are present in all extremities. No JVD. Respiratory: Normal respiratory effort. Lungs are clear to auscultation bilaterally. No wheezes, crackles, or rhonchi.    Gastrointestinal: Soft, non tender, and non distended with positive bowel sounds. No rebound or guarding. Genitourinary: No CVA tenderness.Bilateral testicles are descended with no tenderness to palpation, bilateral positive cremasteric reflexes are present, no swelling or erythema of the scrotum. No evidence of inguinal hernia. Musculoskeletal: Nontender with normal range of motion in all extremities. No edema, cyanosis, or erythema of extremities. Neurologic: Normal speech and language. Face is symmetric. Moving all extremities. No gross focal neurologic deficits are appreciated. Skin: Skin is warm, dry and intact. No rash noted. Psychiatric: Mood and affect are normal. Speech and behavior are normal.  ____________________________________________   LABS (all labs ordered are listed, but only abnormal results are displayed)  Labs Reviewed  COMPREHENSIVE METABOLIC PANEL - Abnormal; Notable for the following:       Result Value   Glucose, Bld 103 (*)    All other components within normal limits  URINALYSIS, COMPLETE (UACMP) WITH MICROSCOPIC - Abnormal; Notable for the following:    Color, Urine COLORLESS (*)    APPearance CLEAR (*)    Specific Gravity, Urine 1.001 (*)    All other components within normal limits  LIPASE, BLOOD  CBC  GLUCOSE, CAPILLARY   ____________________________________________  EKG  none ____________________________________________  RADIOLOGY  none  ____________________________________________   PROCEDURES  Procedure(s) performed: None Procedures Critical Care performed:  None ____________________________________________   INITIAL IMPRESSION / ASSESSMENT AND PLAN / ED COURSE  31 y.o. male the history of bipolar disorder who presents for evaluation of decreased appetite and anxiety. Patient has what sounds like yesterday tried his last week and is still having diarrhea. He is well appearing, no distress, vital signs are within normal limits, his  abdomen is soft with no tenderness throughout. GU exam with no acute findings. Blood work here is within normal limits. He has a normal urinalysis with no acute tones or UTI, normal CMP, normal lipase, normal CBC. I effort to even IV fluids the patient refused and just wanted to make sure that his blood work was normal. No indication for imaging at this time. Presentation concerning for viral gastroenteritis. We'll discharge home with Zofran and follow up with primary care doctor.     Pertinent labs & imaging results that were available during my care of the patient were reviewed by me and considered in my medical decision making (see chart for details).    ____________________________________________   FINAL CLINICAL IMPRESSION(S) / ED DIAGNOSES  Final diagnoses:  Viral gastroenteritis      NEW MEDICATIONS STARTED DURING THIS VISIT:  New Prescriptions   ONDANSETRON (ZOFRAN) 4 MG TABLET    Take 1 tablet (4 mg total) by mouth every 8 (eight) hours as needed for nausea or vomiting.     Note:  This document was prepared using Dragon voice recognition software and may include unintentional dictation errors.    Nita Sicklearolina Yunique Dearcos, MD 04/02/16  1754  

## 2016-04-02 NOTE — ED Triage Notes (Signed)
Patient presents to the ED with anxiety, lower abdominal pain, increased thirst and increased urination x 1 week.  Patient states last Tuesday he was vomiting multiple times that day and getting sick caused him to feel very anxious.  Patient reports history of anxiety, taking anxiety medication and talking to a counselor but he stopped taking anxiety medication this summer because anxiety seemed under control.  Patient denies vomiting since 1 week ago Tuesday.  Patient reports seeing his PCP yesterday and being told to follow-up with [facility] but patient hadn't yet.  Patient states he is having discomfort to lower abdomen and groin area that comes and goes.  Patient ambulatory to triage and is in no obvious distress at this time.

## 2016-04-02 NOTE — Telephone Encounter (Signed)
Patient called states came in yesterday for an appt., he is very anxious and having anxiety.  States that you all did discuss and would like to see if you can give him an rx for something?

## 2016-04-03 LAB — MYOGLOBIN, SERUM: MYOGLOBIN: 42 ug/L (ref <=95)

## 2016-04-06 ENCOUNTER — Ambulatory Visit: Payer: Self-pay | Admitting: Family Medicine

## 2016-04-13 ENCOUNTER — Ambulatory Visit: Payer: Self-pay | Admitting: Family Medicine

## 2016-09-22 ENCOUNTER — Ambulatory Visit (INDEPENDENT_AMBULATORY_CARE_PROVIDER_SITE_OTHER): Payer: No Typology Code available for payment source | Admitting: Family Medicine

## 2016-09-22 ENCOUNTER — Encounter: Payer: Self-pay | Admitting: Family Medicine

## 2016-09-22 DIAGNOSIS — Z0000 Encounter for general adult medical examination without abnormal findings: Secondary | ICD-10-CM | POA: Insufficient documentation

## 2016-09-22 NOTE — Assessment & Plan Note (Signed)
USPSTF grade A and B recommendations reviewed with patient; age-appropriate recommendations, preventive care, screening tests, etc discussed and encouraged; healthy living encouraged; see AVS for patient education given to patient; return on Thursday for fasting labs

## 2016-09-22 NOTE — Progress Notes (Addendum)
Patient ID: Louis Newton, male   DOB: 02-22-1986, 31 y.o.   MRN: [id number]   Subjective:   Louis BoroughsJamie L Newton is a 31 y.o. male here for a complete physical exam  Interim issues since last visit: no medical excitement  Cousin with HTN; he was urinating a lot last week; drinking more gatorade and powerade; this week he's fine; running around last week, really active, hot; doesn't sweat that bad Last summer, episode of brief reactive psychosis; not seeing psychiatrist; he says he is good; nothing religious, no hallucinations, etc  USPSTF grade A and B recommendations Depression:  Depression screen [facility] 2[facility] 09/22/2016 04/01/2016 09/05/2015  Decreased Interest 0 0 0  Down, Depressed, Hopeless 0 0 1  PHQ - 2 Score 0 0 1   Hypertension: BP Readings from Last 3 Encounters:  09/22/16 112/68  04/02/16 (!) 164/90  04/01/16 116/78   Obesity: weight up and down; really working now; was just sitting around before Wt Readings from Last 3 Encounters:  09/22/16 164 lb 1.6 oz (74.4 kg)  04/02/16 180 lb (81.6 kg)  04/01/16 181 lb 3 oz (82.2 kg)   BMI Readings from Last 3 Encounters:  09/22/16 24.21 kg/m  04/02/16 26.58 kg/m  04/01/16 26.00 kg/m    Alcohol: socially, less than target Tobacco use: no HIV, hep B, hep C: not intersted Single STD testing and prevention (chl/gon/syphilis): not interested Lipids:  Lab Results  Component Value Date   CHOL 133 09/11/2015   Lab Results  Component Value Date   HDL 31 (L) 09/11/2015   Lab Results  Component Value Date   LDLCALC 88 09/11/2015   Lab Results  Component Value Date   TRIG 69 09/11/2015   Lab Results  Component Value Date   CHOLHDL 4.3 09/11/2015   No results found for: LDLDIRECT Glucose:  Glucose, Bld  Date Value Ref Range Status  04/02/2016 103 (H) 65 - 99 mg/dL Final  21/30/865702/08/2016 97 65 - 99 mg/dL Final  84/69/629507/17/2017 91 65 - 99 mg/dL Final   Glucose-Capillary  Date Value Ref Range Status  04/02/2016 89 65 - 99  mg/dL Final   Colorectal cancer: no fam hx; start at age 89 Prostate cancer: n/a No results found for: PSA Lung cancer:  n/a AAA: n/a Aspirin: n/a Diet: worried about sodium and cholesterol with his BP; eats but not like he's supposed to; grabs stuff on the go; eats some mornings, eats really fast; grilled chicken salad last night Exercise: very active at work Skin cancer: no moles changing  Past Medical History:  Diagnosis Date  . 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 Rhabdomyolysis 09/07/2015   History reviewed. No pertinent surgical history. Family History  Problem Relation Age of Onset  . Kidney disease Father   . Heart disease Maternal Grandmother   . Dementia Paternal Grandfather   . Epilepsy Brother    Social History  Substance Use Topics  . Smoking status: Never Smoker  . Smokeless tobacco: Never Used  . Alcohol use 0.0 oz/week     Comment: social   Review of Systems  Objective:   Vitals:   09/22/16 1058  BP: 112/68  Pulse: 63  Resp: 14  Temp: (!) 97.5 F (36.4 C)  TempSrc: Oral  SpO2: 97%  Weight: 164 lb 1.6 oz (74.4 kg)  Height: 5' 9.04" (1.754 m)   Body mass index is 24.21 kg/m. Wt Readings from Last 3 Encounters:  09/22/16 164 lb 1.6 oz (74.4 kg)  04/02/16 180 lb (81.6 kg)  04/01/16 181 lb 3 oz (82.2 kg)   Physical Exam  Constitutional: He appears well-developed and well-nourished. No distress.  HENT:  Head: Normocephalic and atraumatic.  Nose: Nose normal.  Mouth/Throat: Oropharynx is clear and moist.  Eyes: EOM are normal. No scleral icterus.  Neck: No JVD present. No thyromegaly present.  Cardiovascular: Normal rate, regular rhythm and normal heart sounds.   Pulmonary/Chest: Effort normal and breath sounds normal. No respiratory distress. He has no wheezes. He has no rales.  Abdominal: Soft. Bowel sounds are normal. He exhibits no distension. There is no tenderness. There is no guarding.  Musculoskeletal: Normal  range of motion. He exhibits no edema.  Lymphadenopathy:    He has no cervical adenopathy.  Neurological: He is alert. He displays normal reflexes. He exhibits normal muscle tone. Coordination normal.  Skin: Skin is warm and dry. No rash noted. He is not diaphoretic. No erythema. No pallor.  Psychiatric: He has a normal mood and affect. His behavior is normal. Judgment and thought content normal.    Assessment/Plan:   Problem List Items Addressed This Visit      Other   Preventative health care    USPSTF grade A and B recommendations reviewed with patient; age-appropriate recommendations, preventive care, screening tests, etc discussed and encouraged; healthy living encouraged; see AVS for patient education given to patient; return on Thursday for fasting labs      Relevant Orders   CBC with Differential/Platelet   COMPLETE METABOLIC PANEL WITH GFR   Lipid panel   TSH      No orders of the defined types were placed in this encounter.  Orders Placed This Encounter  Procedures  . CBC with Differential/Platelet    Standing Status:   Future    Standing Expiration Date:   12/24/2016  . COMPLETE METABOLIC PANEL WITH GFR    Standing Status:   Future    Standing Expiration Date:   12/24/2016  . Lipid panel    Standing Status:   Future    Standing Expiration Date:   12/24/2016  . TSH    Standing Status:   Future    Standing Expiration Date:   12/24/2016    Follow up plan: Return in about 1 year (around 09/22/2017) for complete physical.  An After Visit Summary was printed and given to the patient. Patient was encouraged to perform STE and contact office for referral to urologist if any lumps or concerns

## 2016-09-22 NOTE — Patient Instructions (Addendum)
Return on Thursday for fasting labs  Health Maintenance, Male A healthy lifestyle and preventive care is important for your health and wellness. Ask your health care provider about what schedule of regular examinations is right for you. What should I know about weight and diet? Eat a Healthy Diet  Eat plenty of vegetables, fruits, whole grains, low-fat dairy products, and lean protein.  Do not eat a lot of foods high in solid fats, added sugars, or salt.  Maintain a Healthy Weight Regular exercise can help you achieve or maintain a healthy weight. You should:  Do at least 150 minutes of exercise each week. The exercise should increase your heart rate and make you sweat (moderate-intensity exercise).  Do strength-training exercises at least twice a week.  Watch Your Levels of Cholesterol and Blood Lipids  Have your blood tested for lipids and cholesterol every 5 years starting at 33 years of age. If you are at high risk for heart disease, you should start having your blood tested when you are 33 years old. You may need to have your cholesterol levels checked more often if: ? Your lipid or cholesterol levels are high. ? You are older than 33 years of age. ? You are at high risk for heart disease.  What should I know about cancer screening? Many types of cancers can be detected early and may often be prevented. Lung Cancer  You should be screened every year for lung cancer if: ? You are a current smoker who has smoked for at least 30 years. ? You are a former smoker who has quit within the past 15 years.  Talk to your health care provider about your screening options, when you should start screening, and how often you should be screened.  Colorectal Cancer  Routine colorectal cancer screening usually begins at 33 years of age and should be repeated every 5-10 years until you are 33 years old. You may need to be screened more often if early forms of precancerous polyps or small  growths are found. Your health care provider may recommend screening at an earlier age if you have risk factors for colon cancer.  Your health care provider may recommend using home test kits to check for hidden blood in the stool.  A small camera at the end of a tube can be used to examine your colon (sigmoidoscopy or colonoscopy). This checks for the earliest forms of colorectal cancer.  Prostate and Testicular Cancer  Depending on your age and overall health, your health care provider may do certain tests to screen for prostate and testicular cancer.  Talk to your health care provider about any symptoms or concerns you have about testicular or prostate cancer.  Skin Cancer  Check your skin from head to toe regularly.  Tell your health care provider about any new moles or changes in moles, especially if: ? There is a change in a mole's size, shape, or color. ? You have a mole that is larger than a pencil eraser.  Always use sunscreen. Apply sunscreen liberally and repeat throughout the day.  Protect yourself by wearing long sleeves, pants, a wide-brimmed hat, and sunglasses when outside.  What should I know about heart disease, diabetes, and high blood pressure?  If you are 5718-33 years of age, have your blood pressure checked every 3-5 years. If you are 33 years of age or older, have your blood pressure checked every year. You should have your blood pressure measured twice-once when you are at  a hospital or clinic, and once when you are not at a hospital or clinic. Record the average of the two measurements. To check your blood pressure when you are not at a hospital or clinic, you can use: ? An automated blood pressure machine at a pharmacy. ? A home blood pressure monitor.  Talk to your health care provider about your target blood pressure.  If you are between 35-71 years old, ask your health care provider if you should take aspirin to prevent heart disease.  Have regular  diabetes screenings by checking your fasting blood sugar level. ? If you are at a normal weight and have a low risk for diabetes, have this test once every three years after the age of 66. ? If you are overweight and have a high risk for diabetes, consider being tested at a younger age or more often.  A one-time screening for abdominal aortic aneurysm (AAA) by ultrasound is recommended for men aged 33-75 years who are current or former smokers. What should I know about preventing infection? Hepatitis B If you have a higher risk for hepatitis B, you should be screened for this virus. Talk with your health care provider to find out if you are at risk for hepatitis B infection. Hepatitis C Blood testing is recommended for:  Everyone born from [birth year] through 1967.  Anyone with known risk factors for hepatitis C.  Sexually Transmitted Diseases (STDs)  You should be screened each year for STDs including gonorrhea and chlamydia if: ? You are sexually active and are younger than 33 years of age. ? You are older than 33 years of age and your health care provider tells you that you are at risk for this type of infection. ? Your sexual activity has changed since you were last screened and you are at an increased risk for chlamydia or gonorrhea. Ask your health care provider if you are at risk.  Talk with your health care provider about whether you are at high risk of being infected with HIV. Your health care provider may recommend a prescription medicine to help prevent HIV infection.  What else can I do?  Schedule regular health, dental, and eye exams.  Stay current with your vaccines (immunizations).  Do not use any tobacco products, such as cigarettes, chewing tobacco, and e-cigarettes. If you need help quitting, ask your health care provider.  Limit alcohol intake to no more than 2 drinks per day. One drink equals 12 ounces of beer, 5 ounces of wine, or 1 ounces of hard liquor.  Do not use  street drugs.  Do not share needles.  Ask your health care provider for help if you need support or information about quitting drugs.  Tell your health care provider if you often feel depressed.  Tell your health care provider if you have ever been abused or do not feel safe at home. This information is not intended to replace advice given to you by your health care provider. Make sure you discuss any questions you have with your health care provider. Document Released: 08/08/2007 Document Revised: 10/09/2015 Document Reviewed: 11/13/2014 Elsevier Interactive Patient Education  [employer].

## 2017-01-13 ENCOUNTER — Telehealth: Payer: Self-pay | Admitting: Pharmacy Technician

## 2017-01-13 NOTE — Telephone Encounter (Signed)
Patient failed to provide current poi for 2018.  No additional medication assistance will be provided by [facility] without the required proof of income documentation.  Patient notified by letter.  Adamari Frede J. Devantae Babe Care Manager [facility] 

## 2017-01-19 ENCOUNTER — Other Ambulatory Visit: Payer: Self-pay

## 2017-01-19 ENCOUNTER — Telehealth: Payer: Self-pay | Admitting: Family Medicine

## 2017-01-19 DIAGNOSIS — Z0000 Encounter for general adult medical examination without abnormal findings: Secondary | ICD-10-CM

## 2017-01-19 NOTE — Telephone Encounter (Signed)
Patient called and states he will come in next week.

## 2017-01-19 NOTE — Telephone Encounter (Signed)
Please contact patient and ask him to please have the labs done soon that were ordered in July Thank you

## 2017-02-10 ENCOUNTER — Telehealth: Payer: Self-pay | Admitting: Family Medicine

## 2017-02-10 NOTE — Telephone Encounter (Signed)
Please ask patient to come by this week or next for his fasting labs Thank you

## 2017-02-11 NOTE — Telephone Encounter (Signed)
Called pt no answer. LM for pt informing him of the need to come by and have labs done. Also sent text for mychart sign up.

## 2017-03-15 ENCOUNTER — Encounter: Payer: Self-pay | Admitting: Family Medicine

## 2017-03-15 ENCOUNTER — Encounter: Payer: Self-pay | Admitting: Emergency Medicine

## 2017-03-15 ENCOUNTER — Ambulatory Visit (INDEPENDENT_AMBULATORY_CARE_PROVIDER_SITE_OTHER): Payer: Self-pay | Admitting: Family Medicine

## 2017-03-15 VITALS — BP 110/70 | HR 72 | Temp 98.3°F | Resp 18 | Ht 69.0 in | Wt 166.6 lb

## 2017-03-15 DIAGNOSIS — R05 Cough: Secondary | ICD-10-CM

## 2017-03-15 DIAGNOSIS — R059 Cough, unspecified: Secondary | ICD-10-CM

## 2017-03-15 DIAGNOSIS — R0981 Nasal congestion: Secondary | ICD-10-CM

## 2017-03-15 DIAGNOSIS — J069 Acute upper respiratory infection, unspecified: Secondary | ICD-10-CM

## 2017-03-15 MED ORDER — FLUTICASONE PROPIONATE 50 MCG/ACT NA SUSP: 2.0000 | Freq: Every day | NASAL | 0 refills | Status: DC

## 2017-03-15 MED ORDER — BENZONATATE 100 MG PO CAPS: 100.0000 mg | ORAL_CAPSULE | Freq: Three times a day (TID) | ORAL | 0 refills | Status: DC | PRN

## 2017-03-15 MED ORDER — FEXOFENADINE HCL 180 MG PO TABS: 180.0000 mg | ORAL_TABLET | Freq: Every day | ORAL | 0 refills | Status: DC

## 2017-03-15 NOTE — Progress Notes (Signed)
Name: Louis Newton   MRN: [id number]    DOB: 08-15-1985   Date:03/15/2017       Progress Note  Subjective  Chief Complaint  Chief Complaint  Patient presents with  . Nasal Congestion    cough (yellowish phelgm) for 5 days    HPI  Pt presents with 5 days of nasal congestion, productive purulent cough.  Has tried dayquil and tylenol without relief of symptoms.  Denies chest pain, shortness of breath, body aches, fevers/chills, abdominal pain, NVD, eat pain/pressure.  Recent sick contact is his fiance.  Patient Active Problem List   Diagnosis Date Noted  . Preventative health care 09/22/2016  . 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 Involuntary commitment 09/07/2015    Social History   Tobacco Use  . Smoking status: Never Smoker  . Smokeless tobacco: Never Used  Substance Use Topics  . Alcohol use: Yes    Alcohol/week: 0.0 oz    Comment: social     Current Outpatient Medications:  .  ondansetron (ZOFRAN) 4 MG tablet, Take 1 tablet (4 mg total) by mouth every 8 (eight) hours as needed for nausea or vomiting. (Patient not taking: Reported on 09/22/2016), Disp: 20 tablet, Rfl: 0  Allergies  Allergen Reactions  . Bee Venom Swelling    ROS  Ten systems reviewed and is negative except as mentioned in HPI.  Objective  Vitals:   03/15/17 1350  BP: 110/70  Pulse: 72  Resp: 18  Temp: 98.3 F (36.8 C)  TempSrc: Oral  SpO2: 98%  Weight: 166 lb 9.6 oz (75.6 kg)  Height: 5\' 9"  (1.753 m)   Body mass index is 24.6 kg/m.  Nursing Note and Vital Signs reviewed.  Physical Exam  Constitutional: Patient appears well-developed and well-nourished. No distress.  HEENT: head atraumatic, normocephalic, pupils equal and reactive to light, EOM's intact, TM's without erythema or bulging, no maxillary or frontal sinus tenderness , neck supple without lymphadenopathy,  oropharynx pink and moist without exudate Cardiovascular: Normal rate, regular rhythm, S1/S2 present.  No murmur or rub heard. No BLE edema. Pulmonary/Chest: Effort normal and breath sounds clear. No respiratory distress or retractions. Psychiatric: Patient has a normal mood and affect. behavior is normal. Judgment and thought content normal.  No results found for this or any previous visit (from the past 72 hour(s)).  Assessment & Plan  1. Upper respiratory tract infection, unspecified type - benzonatate (TESSALON PERLES) 100 MG capsule; Take 1 capsule (100 mg total) by mouth 3 (three) times daily as needed.  Dispense: 30 capsule; Refill: 0 - fluticasone (FLONASE) 50 MCG/ACT nasal spray; Place 2 sprays into both nostrils daily.  Dispense: 16 g; Refill: 0 - fexofenadine (ALLEGRA ALLERGY) 180 MG tablet; Take 1 tablet (180 mg total) by mouth daily.  Dispense: 15 tablet; Refill: 0 - Advised likely viral at this point due to lack of signs of bacterial infection. Drink plenty of fluids and get plenty of rest.  2. Nasal congestion - fluticasone (FLONASE) 50 MCG/ACT nasal spray; Place 2 sprays into both nostrils daily.  Dispense: 16 g; Refill: 0 - fexofenadine (ALLEGRA ALLERGY) 180 MG tablet; Take 1 tablet (180 mg total) by mouth daily.  Dispense: 15 tablet; Refill: 0  3. Cough - benzonatate (TESSALON PERLES) 100 MG capsule; Take 1 capsule (100 mg total) by mouth 3 (three) times daily as needed.  Dispense: 30 capsule; Refill: 0  -Red flags  and when to present for emergency care or RTC including fever >101.51F, chest pain, shortness of breath, new/worsening/un-resolving symptoms, reviewed with patient at time of visit. Follow up and care instructions discussed and provided in AVS.

## 2017-03-15 NOTE — Patient Instructions (Signed)
Cool Mist Vaporizer A cool mist vaporizer is a device that releases a cool mist into the air. If you have a cough or a cold, using a vaporizer may help relieve your symptoms. The mist adds moisture to the air, which may help thin your mucus and make it less sticky. When your mucus is thin and less sticky, it easier for you to breathe and to cough up secretions. Do not use a vaporizer if you are allergic to mold. Follow these instructions at home:  Follow the instructions that come with the vaporizer.  Do not use anything other than distilled water in the vaporizer.  Do not run the vaporizer all of the time. Doing that can cause mold or bacteria to grow in the vaporizer.  Clean the vaporizer after each time that you use it.  Clean and dry the vaporizer well before storing it.  Stop using the vaporizer if your breathing symptoms get worse. This information is not intended to replace advice given to you by your health care provider. Make sure you discuss any questions you have with your health care provider. Document Released: 11/07/2003 Document Revised: 08/30/2015 Document Reviewed: 05/11/2015 Elsevier Interactive Patient Education  2018 [employer]   Upper Respiratory Infection, Adult Most upper respiratory infections (URIs) are caused by a virus. A URI affects the nose, throat, and upper air passages. The most common type of URI is often called "the common cold." Follow these instructions at home:  Take medicines only as told by your doctor.  Gargle warm saltwater or take cough drops to comfort your throat as told by your doctor.  Use a warm mist humidifier or inhale steam from a shower to increase air moisture. This may make it easier to breathe.  Drink enough fluid to keep your pee (urine) clear or pale yellow.  Eat soups and other clear broths.  Have a healthy diet.  Rest as needed.  Go back to work when your fever is gone or your doctor says it is okay. ? You may need  to stay home longer to avoid giving your URI to others. ? You can also wear a face mask and wash your hands often to prevent spread of the virus.  Use your inhaler more if you have asthma.  Do not use any tobacco products, including cigarettes, chewing tobacco, or electronic cigarettes. If you need help quitting, ask your doctor. Contact a doctor if:  You are getting worse, not better.  Your symptoms are not helped by medicine.  You have chills.  You are getting more short of breath.  You have brown or red mucus.  You have yellow or brown discharge from your nose.  You have pain in your face, especially when you bend forward.  You have a fever.  You have puffy (swollen) neck glands.  You have pain while swallowing.  You have white areas in the back of your throat. Get help right away if:  You have very bad or constant: ? Headache. ? Ear pain. ? Pain in your forehead, behind your eyes, and over your cheekbones (sinus pain). ? Chest pain.  You have long-lasting (chronic) lung disease and any of the following: ? Wheezing. ? Long-lasting cough. ? Coughing up blood. ? A change in your usual mucus.  You have a stiff neck.  You have changes in your: ? Vision. ? Hearing. ? Thinking. ? Mood. This information is not intended to replace advice given to you by your health care provider. Make   sure you discuss any questions you have with your health care provider. Document Released: 07/29/2007 Document Revised: 10/13/2015 Document Reviewed: 05/17/2013 Elsevier Interactive Patient Education  2018 Elsevier Inc.   

## 2017-03-19 ENCOUNTER — Ambulatory Visit: Payer: Self-pay | Admitting: Family Medicine

## 2017-03-22 ENCOUNTER — Ambulatory Visit: Payer: Self-pay | Admitting: Family Medicine

## 2017-03-22 ENCOUNTER — Encounter: Payer: Self-pay | Admitting: Family Medicine

## 2017-03-22 ENCOUNTER — Ambulatory Visit (INDEPENDENT_AMBULATORY_CARE_PROVIDER_SITE_OTHER): Payer: Self-pay | Admitting: Family Medicine

## 2017-03-22 ENCOUNTER — Encounter: Payer: Self-pay | Admitting: Intensive Care

## 2017-03-22 ENCOUNTER — Emergency Department
Admission: EM | Admit: 2017-03-22 | Discharge: 2017-03-22 | Disposition: A | Discharge status: Home / Self Care | Payer: Self-pay | Attending: Emergency Medicine | Admitting: Emergency Medicine

## 2017-03-22 VITALS — BP 124/72 | HR 93 | Temp 97.9°F | Resp 18 | Ht 69.0 in | Wt 161.8 lb

## 2017-03-22 DIAGNOSIS — F419 Anxiety disorder, unspecified: Secondary | ICD-10-CM

## 2017-03-22 DIAGNOSIS — R1084 Generalized abdominal pain: Secondary | ICD-10-CM | POA: Insufficient documentation

## 2017-03-22 DIAGNOSIS — R1031 Right lower quadrant pain: Secondary | ICD-10-CM

## 2017-03-22 DIAGNOSIS — R1032 Left lower quadrant pain: Secondary | ICD-10-CM

## 2017-03-22 DIAGNOSIS — R0981 Nasal congestion: Secondary | ICD-10-CM | POA: Insufficient documentation

## 2017-03-22 DIAGNOSIS — Z8739 Personal history of other diseases of the musculoskeletal system and connective tissue: Secondary | ICD-10-CM

## 2017-03-22 DIAGNOSIS — R05 Cough: Secondary | ICD-10-CM | POA: Insufficient documentation

## 2017-03-22 DIAGNOSIS — Z8659 Personal history of other mental and behavioral disorders: Secondary | ICD-10-CM

## 2017-03-22 HISTORY — DX: Anxiety disorder, unspecified: F41.9

## 2017-03-22 LAB — CBC
HCT: 46 % (ref 40.0–52.0)
Hemoglobin: 15.4 g/dL (ref 13.0–18.0)
MCH: 27.4 pg (ref 26.0–34.0)
MCHC: 33.3 g/dL (ref 32.0–36.0)
MCV: 82 fL (ref 80.0–100.0)
PLATELETS: 317 10*3/uL (ref 150–440)
RBC: 5.61 MIL/uL (ref 4.40–5.90)
RDW: 12.8 % (ref 11.5–14.5)
WBC: 9 10*3/uL (ref 3.8–10.6)

## 2017-03-22 LAB — POCT URINALYSIS DIPSTICK
Bilirubin, UA: NEGATIVE
Blood, UA: NEGATIVE
Glucose, UA: NEGATIVE
KETONES UA: NEGATIVE
LEUKOCYTES UA: NEGATIVE
NITRITE UA: NEGATIVE
PH UA: 5 (ref 5.0–8.0)
PROTEIN UA: NEGATIVE
Spec Grav, UA: 1.02 (ref 1.010–1.025)
UROBILINOGEN UA: NEGATIVE U/dL — AB

## 2017-03-22 LAB — COMPREHENSIVE METABOLIC PANEL
ALT: 13 U/L — ABNORMAL LOW (ref 17–63)
AST: 19 U/L (ref 15–41)
Albumin: 4.7 g/dL (ref 3.5–5.0)
Alkaline Phosphatase: 74 U/L (ref 38–126)
Anion gap: 10 (ref 5–15)
BUN: 9 mg/dL (ref 6–20)
CO2: 25 mmol/L (ref 22–32)
Calcium: 9.9 mg/dL (ref 8.9–10.3)
Chloride: 105 mmol/L (ref 101–111)
Creatinine, Ser: 0.99 mg/dL (ref 0.61–1.24)
GFR calc Af Amer: >60 mL/min (ref >60)
GFR calc non Af Amer: >60 mL/min (ref >60)
Glucose, Bld: 103 mg/dL — ABNORMAL HIGH (ref 65–99)
Potassium: 3.7 mmol/L (ref 3.5–5.1)
Sodium: 140 mmol/L (ref 135–145)
Total Bilirubin: 0.7 mg/dL (ref 0.3–1.2)
Total Protein: 8.2 g/dL — ABNORMAL HIGH (ref 6.5–8.1)

## 2017-03-22 LAB — LIPASE, BLOOD: Lipase: 22 U/L (ref 11–51)

## 2017-03-22 MED ORDER — PROPRANOLOL HCL 20 MG PO TABS: 20.0000 mg | ORAL_TABLET | Freq: Two times a day (BID) | ORAL | 0 refills | Status: DC | PRN

## 2017-03-22 NOTE — ED Provider Notes (Signed)
[facility] Emergency Department Provider Note  ____________________________________________  Time seen: Approximately 8:33 PM  I have reviewed the triage vital signs and the nursing notes.   HISTORY  Chief Complaint Anxiety and Abdominal Pain    HPI Louis Newton is a 32 y.o. male who complains of nasal congestion and productive cough for the past 3 days. He started taking antihistamines and decongestants, and afterwards started feeling more jittery. He has a history of what he describes as anxiety in the electronic medical records shows that he's had problems with schizophrenia and bipolar disorder in the past. Denies any SI HI or hallucinations at this time. He is future oriented and his mother at bedside feels that his behavior is appropriate.  However, he continues to struggle with his anxiety today. It's been bothering him for the past 3 days and he feels very restless and having difficulty sleeping at night. Also been having some generalized abdominal pain that migrates all around. No chest pain shortness of breath fevers. No vomiting or diarrhea. Currently it feels better and he does feel calmer. No aggravating or alleviating factors. Pain is mild and described as tightness when present.     Past Medical History:  Diagnosis Date  . Anxiety   . 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 Rhabdomyolysis 09/07/2015     Patient Active Problem List   Diagnosis Date Noted  . Preventative health care 09/22/2016  . 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 Involuntary commitment 09/07/2015     History reviewed. No pertinent surgical history.   Prior to Admission medications   Medication Sig Start Date End Date Taking? Authorizing Provider  fexofenadine (ALLEGRA ALLERGY) 180 MG tablet Take 1 tablet (180 mg  total) by mouth daily. 03/15/17   Doren CustardBoyce, Emily E, FNP  propranolol (INDERAL) 20 MG tablet Take 1 tablet (20 mg total) by mouth 2 (two) times daily as needed. 03/22/17   Sharman CheekStafford, Hashem Goynes, MD     Allergies Bee venom   Family History  Problem Relation Age of Onset  . Kidney disease Father   . Heart disease Maternal Grandmother   . Dementia Paternal Grandfather   . Epilepsy Brother     Social History Social History   Tobacco Use  . Smoking status: Never Smoker  . Smokeless tobacco: Never Used  Substance Use Topics  . Alcohol use: Yes    Alcohol/week: 0.0 oz    Comment: social  . Drug use: No    Review of Systems  Constitutional:   No fever or chills.  ENT:   No sore throat. Positive nasal congestion.. Cardiovascular:   No chest pain or syncope. Respiratory:   No dyspnea , positive productive cough. Gastrointestinal:   Positive as above for abdominal pain without vomiting and diarrhea.  Musculoskeletal:   Negative for focal pain or swelling All other systems reviewed and are negative except as documented above in ROS and HPI.  ____________________________________________   PHYSICAL EXAM:  VITAL SIGNS: ED Triage Vitals  Enc Vitals Group     BP 03/22/17 1656 (!) 155/79     Pulse Rate 03/22/17 1656 76     Resp 03/22/17 1656 16     Temp 03/22/17 1656 98.2 F (36.8 C)     Temp Source 03/22/17 1656 Oral     SpO2 03/22/17 1656 99 %     Weight 03/22/17  1657 161 lb (73 kg)     Height 03/22/17 1657 5\' 9"  (1.753 m)     Head Circumference --      Peak Flow --      Pain Score --      Pain Loc --      Pain Edu? --      Excl. in GC? --     Vital signs reviewed, nursing assessments reviewed.   Constitutional:   Alert and oriented. Well appearing and in no distress. Eyes:   No scleral icterus.  EOMI. No nystagmus. No conjunctival pallor. PERRL. ENT   Head:   Normocephalic and atraumatic.   Nose:  Boggy turbinates..    Mouth/Throat:   MMM, no pharyngeal  erythema. No peritonsillar mass.    Neck:   No meningismus. Full ROM. Hematological/Lymphatic/Immunilogical:   No cervical lymphadenopathy. Cardiovascular:   RRR. Symmetric bilateral radial and DP pulses.  No murmurs.  Respiratory:   Normal respiratory effort without tachypnea/retractions. Breath sounds are clear and equal bilaterally. No wheezes/rales/rhonchi. Gastrointestinal:   Soft and nontender. Non distended. There is no CVA tenderness.  No rebound, rigidity, or guarding. Genitourinary:   deferred Musculoskeletal:   Normal range of motion in all extremities. No joint effusions.  No lower extremity tenderness.  No edema. Neurologic:   Normal speech and language.  Motor grossly intact. No acute focal neurologic deficits are appreciated.  Skin:    Skin is warm, dry and intact. No rash noted.  No petechiae, purpura, or bullae.  ____________________________________________    LABS (pertinent positives/negatives) (all labs ordered are listed, but only abnormal results are displayed) Labs Reviewed  COMPREHENSIVE METABOLIC PANEL - Abnormal; Notable for the following components:      Result Value   Glucose, Bld 103 (*)    Total Protein 8.2 (*)    ALT 13 (*)    All other components within normal limits  LIPASE, BLOOD  CBC   ____________________________________________   EKG    ____________________________________________    RADIOLOGY  No results found.  ____________________________________________   PROCEDURES Procedures  ____________________________________________    CLINICAL IMPRESSION / ASSESSMENT AND PLAN / ED COURSE  Pertinent labs & imaging results that were available during my care of the patient were reviewed by me and considered in my medical decision making (see chart for details).   Patient well-appearing no acute distress, vital signs are unremarkable, exam is reassuring. This does appear to all be due to anxiety likely worsened by his use of  antihistamines and decongestants with her side effects. No evidence of pneumonia or significant bacterial illness with his URI.Considering the patient's symptoms, medical history, and physical examination today, I have low suspicion for cholecystitis or biliary pathology, pancreatitis, perforation or bowel obstruction, hernia, intra-abdominal abscess, AAA or dissection, volvulus or intussusception, mesenteric ischemia, or appendicitis.  Suitable for outpatient follow-up. I'll give him a trial of propranolol tablets for his anxiety. Recommend follow-up with primary care.      ____________________________________________   FINAL CLINICAL IMPRESSION(S) / ED DIAGNOSES    Final diagnoses:  Anxiety  Generalized abdominal pain       Portions of this note were generated with dragon dictation software. Dictation errors may occur despite best attempts at proofreading.    Sharman Cheek, MD 03/22/17 2041

## 2017-03-22 NOTE — Progress Notes (Signed)
Name: Louis Newton   MRN: [id number]    DOB: 04-20-1985   Date:03/22/2017       Progress Note  Subjective  Chief Complaint  Chief Complaint  Patient presents with  . Abdominal Pain    nervous, jittery, tense, think it maybe comingt from taking medication  . Anxiety    HPI  Anxiety:  He notes increased anxiety over the last week or so.  He feels jittery, tense, nervous, and getting upset stomach.  Late last week he was at the movie theatre and became very cold, felt panicky, barely slept that night, and had some abdominal pain.  He has history of depression and anxiety in the past along with history of presumed manic episode in 2017.    Denies SI/HI, visual/auditory/tacile hallucination.  He reports having slept only about 10 hours in the last 3-4 days. Decrease appetite, increased urination, mild nausea.  Abdominal Pain:  He notes BLQ abdominal pain x2 days with increased urination, some mild nausea; denies diarrhea, constipation, or vomiting. He is sexually active.  No blood in stool/dark and tarry stools.  Denies dysuria, hematuria.  Has history of psychosis/manic episode and of rhabdomyolosis: He believes this was because of some medications he had been on. Today he denies muscle aches or dark urine, no confusion.  We will check urine today.  He was admitted to the hospital back in 2017 for a "presumed" manic episode - was told to follow up with [facility] - stopped seeing [facility] back in September 2017.  States he was doing well and did not have a reason to see them.  He says anytime he has any illness such as his recent cold, he gets "freaked out" and very anxious.  Patient Active Problem List   Diagnosis Date Noted  . Preventative health care 09/22/2016  . 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 Involuntary commitment 09/07/2015    Social History   Tobacco Use  .  Smoking status: Never Smoker  . Smokeless tobacco: Never Used  Substance Use Topics  . Alcohol use: Yes    Alcohol/week: 0.0 oz    Comment: social     Current Outpatient Medications:  .  fexofenadine (ALLEGRA ALLERGY) 180 MG tablet, Take 1 tablet (180 mg total) by mouth daily., Disp: 15 tablet, Rfl: 0  Allergies  Allergen Reactions  . Bee Venom Swelling    ROS  Ten systems reviewed and is negative except as mentioned in HPI  Objective  Vitals:   03/22/17 1414  BP: 124/72  Pulse: 93  Resp: 18  Temp: 97.9 F (36.6 C)  TempSrc: Oral  SpO2: 98%  Weight: 161 lb 12.8 oz (73.4 kg)  Height: 5\' 9"  (1.753 m)   Body mass index is 23.89 kg/m.  Nursing Note and Vital Signs reviewed.  Physical Exam  Constitutional: Patient appears well-developed and well-nourished.  No distress.  HEENT: head atraumatic, normocephalic Cardiovascular: Normal rate, regular rhythm, S1/S2 present.  No murmur or rub heard. No BLE edema. Pulmonary/Chest: Effort normal and breath sounds clear. No respiratory distress or retractions. Abdominal: Soft and non-tender, bowel sounds present x4 quadrants. Psychiatric: Patient has a moderately anxious mood and affect. behavior is normal. Judgment and thought content appear appropriate.  Speech is somewhat pressured and he is restless in the office, pacing at times. Neurological: he is alert and oriented to person, place, and time. No cranial nerve deficit.  Coordination, balance, strength, speech and gait are normal.  Skin: Skin is warm and dry. No rash noted. No erythema.   Results for orders placed or performed in visit on 03/22/17 (from the past 72 hour(s))  POCT urinalysis dipstick     Status: Abnormal   Collection Time: 03/22/17  2:56 PM  Result Value Ref Range   Color, UA yellow    Clarity, UA clear    Glucose, UA negative    Bilirubin, UA negative    Ketones, UA negative    Spec Grav, UA 1.020 1.010 - 1.025   Blood, UA negative    pH, UA 5.0 5.0  - 8.0   Protein, UA negative    Urobilinogen, UA negative (A) 0.2 or 1.0 E.U./dL   Nitrite, UA negative    Leukocytes, UA Negative Negative   Appearance clear    Odor none     Assessment & Plan  1. History of psychosis 2. History of rhabdomyolysis - Urine is WNL today 3. Abdominal pain, bilateral lower quadrant - POCT urinalysis dipstick - Normal 4. Anxiety  - Pt has history of psychosis/manic episode in 2017 with rhabdomyolysis.  Today he presents with pacing, somewhat pressured speech, and a heightened sense of anxiety/feeling of inability to control his anxiety.  He denies SI/HI, or hallucinations or delusions.  I discussed his care with PCP Dr. Baruch Gouty who agrees that he should present to the ER for further psychiatric evaluation as he does not have any established psychiatric care at this time.  He is willing to go, and had his mother come to pick him up.  I spoke with his mother briefly, with the patient's verbal permission, and she is in agreement to take him directly to [facility] ER, she also verbalizes that she feels safe to take him by herself. Pt is understanding, agreeable, and voices disappointment in himself that he too feels he is experiencing another manic episode.  [facility] ER nurse [facility] is called with report.

## 2017-03-22 NOTE — ED Triage Notes (Signed)
PAtient was [facility] for [facility] and anxiety earlier today. Was told by RN at [facility] he had to come to ER to be checked for anxiety. Pt denies HI/SI. Urine was checked at medical center with normal results. Patient reports he still feels alittle jittery and unable to sleep at night

## 2017-03-22 NOTE — ED Notes (Signed)
Pt states that he was told to get Allegra-D for his URI and that it has since made him jittery.  Pt states that he feels anxious and feels like he cannot sleep due to this jitteryness.  Pt states that he saw his PCP today and was told to come to ER to be evaluated for insomnia and anxiety.  Pt A&Ox4, in NAD.

## 2017-04-07 ENCOUNTER — Encounter: Payer: Self-pay | Admitting: Emergency Medicine

## 2017-04-07 ENCOUNTER — Encounter: Payer: Self-pay | Admitting: Family Medicine

## 2017-04-07 ENCOUNTER — Ambulatory Visit: Payer: Self-pay | Admitting: *Deleted

## 2017-04-07 ENCOUNTER — Ambulatory Visit: Payer: Self-pay | Admitting: Family Medicine

## 2017-04-07 VITALS — BP 130/80 | HR 99 | Temp 98.9°F | Resp 18 | Ht 69.0 in | Wt 164.8 lb

## 2017-04-07 DIAGNOSIS — R6889 Other general symptoms and signs: Secondary | ICD-10-CM

## 2017-04-07 DIAGNOSIS — J101 Influenza due to other identified influenza virus with other respiratory manifestations: Secondary | ICD-10-CM

## 2017-04-07 LAB — POCT INFLUENZA A/B
INFLUENZA A, POC: POSITIVE — AB
Influenza B, POC: NEGATIVE

## 2017-04-07 MED ORDER — OSELTAMIVIR PHOSPHATE 75 MG PO CAPS: 75.0000 mg | ORAL_CAPSULE | Freq: Two times a day (BID) | ORAL | 0 refills | Status: AC

## 2017-04-07 MED ORDER — SALINE SPRAY 0.65 % NA SOLN: 1.0000 | NASAL | 0 refills | Status: DC | PRN

## 2017-04-07 NOTE — Progress Notes (Signed)
Name: Louis Newton   MRN: [id number]    DOB: Jul 23, 1985   Date:04/07/2017       Progress Note  Subjective  Chief Complaint  Chief Complaint  Patient presents with  . Influenza    fever 100.8, chills, bodyache, runny nose since yesterday    HPI  Pt presents with 1 day of body aches, fever, sinus congestion and pressure with some ear pressure, sneezing.  Denies abdominal pain, NVD, chest pain, shortness of breath, cough. When he had a URI last time, the medications sent him into a high anxiety state, so he has not taken any medication at this time. He is flu A positive today - we will start Tamiflu today and try Tylenol/Ibuprofen for fevers/body aches.  Patient Active Problem List   Diagnosis Date Noted  . Preventative health care 09/22/2016  . 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 Involuntary commitment 09/07/2015    Social History   Tobacco Use  . Smoking status: Never Smoker  . Smokeless tobacco: Never Used  Substance Use Topics  . Alcohol use: Yes    Alcohol/week: 0.0 oz    Comment: social     Current Outpatient Medications:  .  fexofenadine (ALLEGRA ALLERGY) 180 MG tablet, Take 1 tablet (180 mg total) by mouth daily., Disp: 15 tablet, Rfl: 0 .  propranolol (INDERAL) 20 MG tablet, Take 1 tablet (20 mg total) by mouth 2 (two) times daily as needed., Disp: 20 tablet, Rfl: 0  Allergies  Allergen Reactions  . Bee Venom Swelling    ROS  Ten systems reviewed and is negative except as mentioned in HPI  Objective  Vitals:   04/07/17 0903  BP: 130/80  Pulse: 99  Resp: 18  Temp: 98.9 F (37.2 C)  TempSrc: Oral  SpO2: 95%  Weight: 164 lb 12.8 oz (74.8 kg)  Height: 5\' 9"  (1.753 m)   Body mass index is 24.34 kg/m.  Nursing Note and Vital Signs reviewed.  Physical Exam  Constitutional: Patient appears well-developed and well-nourished.   No distress.  HEENT: head atraumatic, normocephalic, pupils equal and reactive to light, EOM's intact, TM's without erythema or bulging, no maxillary or frontal sinus tenderness, nasal turbinates are inflamed with rhinorrhea present, neck supple without lymphadenopathy and full AROM, oropharynx pink and moist without exudate. Cardiovascular: Normal rate, regular rhythm, S1/S2 present.  No murmur or rub heard. No BLE edema. Pulmonary/Chest: Effort normal and breath sounds clear. No respiratory distress or retractions. Abdominal: Soft and non-tender, bowel sounds present x4 quadrants. Psychiatric: Patient has a normal mood and affect. behavior is normal. Judgment and thought content normal.  Results for orders placed or performed in visit on 04/07/17 (from the past 72 hour(s))  POCT Influenza A/B     Status: Abnormal   Collection Time: 04/07/17  9:17 AM  Result Value Ref Range   Influenza A, POC Positive (A) Negative   Influenza B, POC Negative Negative    Assessment & Plan  1. Influenza A - sodium chloride (OCEAN) 0.65 % SOLN nasal spray; Place 1 spray into both nostrils as needed for congestion.  Dispense: 88 mL; Refill: 0 - oseltamivir (TAMIFLU) 75 MG capsule; Take 1 capsule (75 mg total) by mouth 2 (two) times daily for 5 days.  Dispense: 10 capsule; Refill: 0 - We will take a minimal approach to symptom management due to patient's history of anxiety reaction to medications.  Advised to push fluids, alternate ibuprofen and tylenol.  2. Flu-like symptoms - POCT Influenza A/B  -Red flags and when to present for emergency care or RTC including fever >101.20F, chest pain, shortness of breath, new/worsening/un-resolving symptoms, reviewed with patient at time of visit. Follow up and care instructions discussed and provided in AVS.

## 2017-04-07 NOTE — Telephone Encounter (Signed)
Pt calling stating that temperature was 101.5. Pt states he had just taken 400mg  of Advil and had Tylenol 650mg  earlier this morning. Advised pt to continue to drink plenty of fluids and to alternate between taking tylenol and advil to control fever. Explained to pt that fever is common with the flu and taking these medications should help reduce the temperature. Advised pt to seek treatment in the ED if temperature is greater than 102 or if it does not decrease with taking medications to decrease fever. Pt verbalized understanding.

## 2017-04-07 NOTE — Patient Instructions (Addendum)
Alternate Tylenol (650mg ) and Advil (400mg ) every 6-8 hours as needed for body aches and fevers. Cool Mist Vaporizer A cool mist vaporizer is a device that releases a cool mist into the air. If you have a cough or a cold, using a vaporizer may help relieve your symptoms. The mist adds moisture to the air, which may help thin your mucus and make it less sticky. When your mucus is thin and less sticky, it easier for you to breathe and to cough up secretions. Do not use a vaporizer if you are allergic to mold. Follow these instructions at home:  Follow the instructions that come with the vaporizer.  Do not use anything other than distilled water in the vaporizer.  Do not run the vaporizer all of the time. Doing that can cause mold or bacteria to grow in the vaporizer.  Clean the vaporizer after each time that you use it.  Clean and dry the vaporizer well before storing it.  Stop using the vaporizer if your breathing symptoms get worse. This information is not intended to replace advice given to you by your health care provider. Make sure you discuss any questions you have with your health care provider. Document Released: 11/07/2003 Document Revised: 08/30/2015 Document Reviewed: 05/11/2015 Elsevier Interactive Patient Education  2018 [employer]   Influenza, Adult Influenza ("the flu") is an infection in the lungs, nose, and throat (respiratory tract). It is caused by a virus. The flu causes many common cold symptoms, as well as a high fever and body aches. It can make you feel very sick. The flu spreads easily from person to person (is contagious). Getting a flu shot (influenza vaccination) every year is the best way to prevent the flu. Follow these instructions at home:  Take over-the-counter and prescription medicines only as told by your doctor.  Use a cool mist humidifier to add moisture (humidity) to the air in your home. This can make it easier to breathe.  Rest as  needed.  Drink enough fluid to keep your pee (urine) clear or pale yellow.  Cover your mouth and nose when you cough or sneeze.  Wash your hands with soap and water often, especially after you cough or sneeze. If you cannot use soap and water, use hand sanitizer.  Stay home from work or school as told by your doctor. Unless you are visiting your doctor, try to avoid leaving home until your fever has been gone for 24 hours without the use of medicine.  Keep all follow-up visits as told by your doctor. This is important. How is this prevented?  Getting a yearly (annual) flu shot is the best way to avoid getting the flu. You may get the flu shot in late summer, fall, or winter. Ask your doctor when you should get your flu shot.  Wash your hands often or use hand sanitizer often.  Avoid contact with people who are sick during cold and flu season.  Eat healthy foods.  Drink plenty of fluids.  Get enough sleep.  Exercise regularly. Contact a doctor if:  You get new symptoms.  You have: ? Chest pain. ? Watery poop (diarrhea). ? A fever.  Your cough gets worse.  You start to have more mucus.  You feel sick to your stomach (nauseous).  You throw up (vomit). Get help right away if:  You start to be short of breath or have trouble breathing.  Your skin or nails turn a bluish color.  You have very bad  pain or stiffness in your neck.  You get a sudden headache.  You get sudden pain in your face or ear.  You cannot stop throwing up. This information is not intended to replace advice given to you by your health care provider. Make sure you discuss any questions you have with your health care provider. Document Released: 11/19/2007 Document Revised: 07/18/2015 Document Reviewed: 12/04/2014 Elsevier Interactive Patient Education  2017 [employer].

## 2017-04-08 ENCOUNTER — Emergency Department: Payer: Self-pay

## 2017-04-08 ENCOUNTER — Other Ambulatory Visit: Payer: Self-pay

## 2017-04-08 ENCOUNTER — Emergency Department
Admission: EM | Admit: 2017-04-08 | Discharge: 2017-04-08 | Disposition: A | Discharge status: Home / Self Care | Payer: Self-pay | Attending: Emergency Medicine | Admitting: Emergency Medicine

## 2017-04-08 ENCOUNTER — Encounter: Payer: Self-pay | Admitting: Emergency Medicine

## 2017-04-08 DIAGNOSIS — J111 Influenza due to unidentified influenza virus with other respiratory manifestations: Secondary | ICD-10-CM | POA: Insufficient documentation

## 2017-04-08 DIAGNOSIS — R509 Fever, unspecified: Secondary | ICD-10-CM

## 2017-04-08 DIAGNOSIS — Z79899 Other long term (current) drug therapy: Secondary | ICD-10-CM | POA: Insufficient documentation

## 2017-04-08 MED ORDER — IBUPROFEN 400 MG PO TABS
400.0000 mg | ORAL_TABLET | Freq: Once | ORAL | Status: DC
  Filled 2017-04-08: qty 1

## 2017-04-08 NOTE — ED Notes (Signed)
Patient transported to X-ray 

## 2017-04-08 NOTE — ED Notes (Signed)

## 2017-04-08 NOTE — ED Triage Notes (Addendum)
Patient ambulatory to triage with steady gait, without difficulty or distress noted, mask in place; pt reports dx with influenza yesterday; c/o persistent fever and congestion; denies any cough; tylenol 650mg  taken PTA and pt taking 400mg  ibuprofen now

## 2017-04-08 NOTE — ED Notes (Addendum)
Pt had 800 mg po ibuprofen from home while waiting at 0200

## 2017-04-08 NOTE — Discharge Instructions (Signed)
1.  Alternate Tylenol and Ibuprofen every 4 hours as needed for fever greater than 100.4 F.  You may take up to 4 g of Tylenol every 24 hours.  You may take 800 mg of Ibuprofen 3 times daily. 2.  Continue and finish Tamiflu as directed by your doctor. 3.  Drink plenty of fluids daily. 4.  Return to the ER for worsening symptoms, persistent vomiting, difficulty breathing or other concerns.

## 2017-04-08 NOTE — ED Provider Notes (Signed)
[facility] Emergency Department Provider Note   ____________________________________________   First MD Initiated Contact with Patient 04/08/17 [phone]     (approximate)  I have reviewed the triage vital signs and the nursing notes.   HISTORY  Chief Complaint Influenza    HPI Louis Newton is a 32 y.o. male who presents to the ED from home with a chief complaint of persistent fever and congestion.  Patient was seen by his PCP yesterday with positive influenza A swab.  Started on Tamiflu.  Reports taking Tylenol and ibuprofen but fever persists.  Complains of nasal congestion, slight dry cough, body aches.  Denies abdominal pain, nausea, vomiting, diarrhea.  Denies recent travel or trauma.   Past Medical History:  Diagnosis Date  . Anxiety   . 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 Preventative health care 09/22/2016  . 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 Involuntary commitment 09/07/2015    History reviewed. No pertinent surgical history.  Prior to Admission medications   Medication Sig Start Date End Date Taking? Authorizing Provider  fexofenadine (ALLEGRA ALLERGY) 180 MG tablet Take 1 tablet (180 mg total) by mouth daily. 03/15/17   Doren CustardBoyce, Emily E, FNP  oseltamivir (TAMIFLU) 75 MG capsule Take 1 capsule (75 mg total) by mouth 2 (two) times daily for 5 days. 04/07/17 04/12/17  Doren CustardBoyce, Emily E, FNP  propranolol (INDERAL) 20 MG tablet Take 1 tablet (20 mg total) by mouth 2 (two) times daily as needed. 03/22/17   Sharman CheekStafford, Phillip, MD  sodium chloride (OCEAN) 0.65 % SOLN nasal spray Place 1 spray into both nostrils as needed for congestion. 04/07/17   Doren CustardBoyce, Emily E, FNP    Allergies Bee venom  Family History    Problem Relation Age of Onset  . Kidney disease Father   . Heart disease Maternal Grandmother   . Dementia Paternal Grandfather   . Epilepsy Brother     Social History Social History   Tobacco Use  . Smoking status: Never Smoker  . Smokeless tobacco: Never Used  Substance Use Topics  . Alcohol use: Yes    Alcohol/week: 0.0 oz    Comment: social  . Drug use: No    Review of Systems  Constitutional: Positive for fever/chills.  Positive for body aches. Eyes: No visual changes. ENT: Positive for nasal congestion. Cardiovascular: Denies chest pain. Respiratory: Denies shortness of breath. Gastrointestinal: No abdominal pain.  No nausea, no vomiting.  No diarrhea.  No constipation. Genitourinary: Negative for dysuria. Musculoskeletal: Negative for back pain. Skin: Negative for rash. Neurological: Negative for headaches, focal weakness or numbness.   ____________________________________________   PHYSICAL EXAM:  VITAL SIGNS: ED Triage Vitals [04/08/17 0200]  Enc Vitals Group     BP (!) 150/76     Pulse Rate (!) 112     Resp 18     Temp (!) 102.7 F (39.3 C)     Temp Source Oral     SpO2 95 %     Weight 164 lb (74.4 kg)     Height 5\' 9"  (1.753 m)     Head Circumference      Peak Flow      Pain Score      Pain Loc      Pain Edu?  Excl. in GC?     Constitutional: Alert and oriented. Well appearing and in no acute distress. Eyes: Conjunctivae are normal. PERRL. EOMI. Head: Atraumatic. Ears: Bilateral TM dullness. Nose: Congestion/rhinnorhea. Mouth/Throat: Mucous membranes are moist.  Oropharynx mildly erythematous without tonsillar swelling, exudate or peritonsillar abscess.  There is no hoarse or muffled voice.  There is no drooling. Neck: No stridor.  Supple neck without meningismus. Cardiovascular: Normal rate, regular rhythm. Grossly normal heart sounds.  Good peripheral circulation. Respiratory: Normal respiratory effort.  No retractions. Lungs  CTAB. Gastrointestinal: Soft and nontender. No distention. No abdominal bruits. No CVA tenderness. Musculoskeletal: No lower extremity tenderness nor edema.  No joint effusions. Neurologic:  Normal speech and language. No gross focal neurologic deficits are appreciated. No gait instability. Skin:  Skin is warm, dry and intact. No rash noted.  No petechiae. Psychiatric: Mood and affect are normal. Speech and behavior are normal.  ____________________________________________   LABS (all labs ordered are listed, but only abnormal results are displayed)  Labs Reviewed - No data to display ____________________________________________  EKG  None ____________________________________________  RADIOLOGY  ED MD interpretation: No pneumonia  Official radiology report(s): Dg Chest 2 View  Result Date: 04/08/2017 CLINICAL DATA:  Fever and congestion. Diagnosed with influenza yesterday. EXAM: CHEST  2 VIEW COMPARISON:  Chest radiograph January 10, 2013 FINDINGS: Cardiomediastinal silhouette is normal. No pleural effusions or focal consolidations. Trachea projects midline and there is no pneumothorax. Soft tissue planes and included osseous structures are non-suspicious. IMPRESSION: Negative. Electronically Signed   By: Awilda Metro M.D.   On: 04/08/2017 03:14    ____________________________________________   PROCEDURES  Procedure(s) performed: None  Procedures  Critical Care performed: No  ____________________________________________   INITIAL IMPRESSION / ASSESSMENT AND PLAN / ED COURSE  As part of my medical decision making, I reviewed the following data within the electronic MEDICAL RECORD NUMBER History obtained from family, Nursing notes reviewed and incorporated, Old chart reviewed, Radiograph reviewed and Notes from prior ED visits.   32 year old male with influenza who presents with persistent fever and nasal congestion.  Reviewed clinic visit with his PCP.  Patient tells  me he is taking ibuprofen 400 mg and Tylenol 650 mg.  Likely fever persist secondary to subtherapeutic doses of antipyretics.  Will obtain chest x-ray to evaluate for pneumonia.   Clinical Course as of Apr 08 414  Thu Apr 08, 2017  0407 Patient afebrile, 98.7 F.  Updated patient and mother of x-ray results.  Strict return precautions given.  Both verbalize understanding and agree with plan of care.  [JS]    Clinical Course User Index [JS] Irean Hong, MD     ____________________________________________   FINAL CLINICAL IMPRESSION(S) / ED DIAGNOSES  Final diagnoses:  Influenza  Fever, unspecified fever cause     ED Discharge Orders    None       Note:  This document was prepared using Dragon voice recognition software and may include unintentional dictation errors.    Irean Hong, MD 04/08/17 [phone]

## 2017-04-12 ENCOUNTER — Ambulatory Visit: Payer: Self-pay

## 2017-04-12 ENCOUNTER — Emergency Department
Admission: EM | Admit: 2017-04-12 | Discharge: 2017-04-12 | Disposition: A | Discharge status: Home / Self Care | Payer: Self-pay | Attending: Emergency Medicine | Admitting: Emergency Medicine

## 2017-04-12 ENCOUNTER — Encounter: Payer: Self-pay | Admitting: Emergency Medicine

## 2017-04-12 DIAGNOSIS — Z79899 Other long term (current) drug therapy: Secondary | ICD-10-CM | POA: Insufficient documentation

## 2017-04-12 DIAGNOSIS — F419 Anxiety disorder, unspecified: Secondary | ICD-10-CM | POA: Insufficient documentation

## 2017-04-12 DIAGNOSIS — G4700 Insomnia, unspecified: Secondary | ICD-10-CM | POA: Insufficient documentation

## 2017-04-12 MED ORDER — LORAZEPAM 1 MG PO TABS: 1.0000 mg | ORAL_TABLET | Freq: Every day | ORAL | 0 refills | Status: AC

## 2017-04-12 NOTE — Telephone Encounter (Signed)
Patient called in saying "I was taking Tamiflu for the flu, but stopped taking it on 2/16 because I was feeling really anxious, not able to sleep, no appetite, no taste and I figured it was from all the medicine I took for the high temperature with the flu, but I wasn't sure. So, I went to the ED already today and they gave me this pill, ativan, to take at night. I have an appointment with Dr. Sherie DonLada tomorrow and I will talk to her more about how I'm feeling and request blood work." I advised him to take the ativan as prescribed tonight for sleep and to talk to Dr. Sherie DonLada at his appointment tomorrow about it and what else is going on, he verbalized understanding.

## 2017-04-12 NOTE — ED Triage Notes (Signed)
Pt with c/o anxiety and unable to sleep. Pt has been taking meds for the flu and was afraid to take his antianxiety meds, was off his meds. Pt with a bag of his meds and the only prescribed meds in the bag are tamiflu and propanolol.

## 2017-04-12 NOTE — ED Provider Notes (Signed)
[facility] Emergency Department Provider Note   ____________________________________________    I have reviewed the triage vital signs and the nursing notes.   HISTORY  Chief Complaint Insomnia and Anxiety     HPI Louis Newton is a 32 y.o. male with a history of anxiety who presents with complaints of difficulty sleeping and anxiety over the last several days.  Patient reports he was recently diagnosed with influenza on the 14th, he was taking Tamiflu but became concerned about taking his usual medications with this so he did not take anything.  Over the last 3 days he has had difficulty sleeping, he states his mind "will not relax ".  He has tried melatonin with little improvement.  No chest pain or shortness of breath.  Reports his flu symptoms have all but abated   Past Medical History:  Diagnosis Date  . Anxiety   . 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 Preventative health care 09/22/2016  . 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 Involuntary commitment 09/07/2015    History reviewed. No pertinent surgical history.  Prior to Admission medications   Medication Sig Start Date End Date Taking? Authorizing Provider  fexofenadine (ALLEGRA ALLERGY) 180 MG tablet Take 1 tablet (180 mg total) by mouth daily. 03/15/17   Doren Custard, FNP  LORazepam (ATIVAN) 1 MG tablet Take 1 tablet (1 mg total) by mouth at bedtime for 3 days. 04/12/17 04/15/17  Jene Every, MD  oseltamivir (TAMIFLU) 75 MG capsule Take 1 capsule (75 mg total) by mouth 2 (two) times daily for 5 days. 04/07/17 04/12/17  Doren Custard, FNP  propranolol (INDERAL) 20 MG tablet Take 1 tablet (20 mg total) by mouth 2 (two) times daily as needed. 03/22/17    Sharman Cheek, MD  sodium chloride (OCEAN) 0.65 % SOLN nasal spray Place 1 spray into both nostrils as needed for congestion. 04/07/17   Doren Custard, FNP     Allergies Bee venom  Family History  Problem Relation Age of Onset  . Kidney disease Father   . Heart disease Maternal Grandmother   . Dementia Paternal Grandfather   . Epilepsy Brother     Social History Social History   Tobacco Use  . Smoking status: Never Smoker  . Smokeless tobacco: Never Used  Substance Use Topics  . Alcohol use: Yes    Alcohol/week: 0.0 oz    Comment: social  . Drug use: No    Review of Systems  Constitutional: No fever/chills Eyes: No visual changes.  ENT: No sore throat. Cardiovascular: Denies chest pain. Respiratory: Denies shortness of breath. Gastrointestinal: No abdominal pain.  No nausea, no vomiting.   Genitourinary: Negative for dysuria. Musculoskeletal: Negative for back pain. Skin: Negative for rash. Neurological: Negative for headaches    ____________________________________________   PHYSICAL EXAM:  VITAL SIGNS: ED Triage Vitals  Enc Vitals Group     BP 04/12/17 1026 (!) 139/97     Pulse Rate 04/12/17 1026 74     Resp 04/12/17 1026 20     Temp 04/12/17 1026 98 F (36.7 C)     Temp Source 04/12/17 1026 Oral     SpO2 04/12/17 1026 98 %     Weight --      Height --  Head Circumference --      Peak Flow --      Pain Score 04/12/17 1126 0     Pain Loc --      Pain Edu? --      Excl. in GC? --     Constitutional: Alert and oriented. No acute distress. Pleasant and interactive Eyes: Conjunctivae are normal.   Nose: No congestion/rhinnorhea. Mouth/Throat: Mucous membranes are moist.    Cardiovascular: Normal rate, regular rhythm. Grossly normal heart sounds.  Good peripheral circulation. Respiratory: Normal respiratory effort.  No retractions. Lungs CTAB. Gastrointestinal: Soft and nontender. No distention.  No CVA tenderness. Genitourinary:  deferred Musculoskeletal: No lower extremity tenderness nor edema.  Warm and well perfused Neurologic:  Normal speech and language. No gross focal neurologic deficits are appreciated.  Skin:  Skin is warm, dry and intact. No rash noted. Psychiatric: Mood and affect are normal. Speech and behavior are normal.  ____________________________________________   LABS (all labs ordered are listed, but only abnormal results are displayed)  Labs Reviewed - No data to display ____________________________________________  EKG  None ____________________________________________  RADIOLOGY  None ____________________________________________   PROCEDURES  Procedure(s) performed: No  Procedures   Critical Care performed: No ____________________________________________   INITIAL IMPRESSION / ASSESSMENT AND PLAN / ED COURSE  Pertinent labs & imaging results that were available during my care of the patient were reviewed by me and considered in my medical decision making (see chart for details).  Patient well-appearing in no acute distress.  He is calm without any thoughts of harming himself or others.  We talked through several methods of stress relief which seems to be at the core of his issues today.  Reviewing his history it does appear that he in the past had a week of difficulty sleeping followed by almost a psychotic episode somewhat suggestive of bipolar disorder.  I suggest this to the patient and recommended following up with psychiatry for further evaluation given that he continues to have difficulty with regulating his anxiety    ____________________________________________   FINAL CLINICAL IMPRESSION(S) / ED DIAGNOSES  Final diagnoses:  Anxiety        Note:  This document was prepared using Dragon voice recognition software and may include unintentional dictation errors.    Jene EveryKinner, Nijah Orlich, MD 04/12/17 1356

## 2017-04-13 ENCOUNTER — Encounter: Payer: Self-pay | Admitting: Emergency Medicine

## 2017-04-13 ENCOUNTER — Encounter: Payer: Self-pay | Admitting: Family Medicine

## 2017-04-13 ENCOUNTER — Other Ambulatory Visit: Payer: Self-pay

## 2017-04-13 ENCOUNTER — Ambulatory Visit: Payer: Self-pay | Admitting: Family Medicine

## 2017-04-13 ENCOUNTER — Emergency Department
Admission: EM | Admit: 2017-04-13 | Discharge: 2017-04-13 | Disposition: A | Discharge status: Home / Self Care | Payer: Self-pay | Attending: Emergency Medicine | Admitting: Emergency Medicine

## 2017-04-13 VITALS — BP 126/80 | HR 101 | Temp 97.8°F | Ht 69.0 in | Wt 158.0 lb

## 2017-04-13 DIAGNOSIS — Z8739 Personal history of other diseases of the musculoskeletal system and connective tissue: Secondary | ICD-10-CM

## 2017-04-13 DIAGNOSIS — F308 Other manic episodes: Secondary | ICD-10-CM

## 2017-04-13 DIAGNOSIS — F4322 Adjustment disorder with anxiety: Secondary | ICD-10-CM

## 2017-04-13 DIAGNOSIS — R253 Fasciculation: Secondary | ICD-10-CM

## 2017-04-13 DIAGNOSIS — F23 Brief psychotic disorder: Secondary | ICD-10-CM | POA: Diagnosis present

## 2017-04-13 DIAGNOSIS — G4700 Insomnia, unspecified: Secondary | ICD-10-CM | POA: Insufficient documentation

## 2017-04-13 DIAGNOSIS — E860 Dehydration: Secondary | ICD-10-CM

## 2017-04-13 DIAGNOSIS — F419 Anxiety disorder, unspecified: Secondary | ICD-10-CM | POA: Insufficient documentation

## 2017-04-13 DIAGNOSIS — R61 Generalized hyperhidrosis: Secondary | ICD-10-CM

## 2017-04-13 DIAGNOSIS — J101 Influenza due to other identified influenza virus with other respiratory manifestations: Secondary | ICD-10-CM

## 2017-04-13 LAB — COMPREHENSIVE METABOLIC PANEL
ALBUMIN: 4.7 g/dL (ref 3.5–5.0)
ALK PHOS: 64 U/L (ref 38–126)
ALT: 18 U/L (ref 17–63)
AST: 21 U/L (ref 15–41)
Anion gap: 11 (ref 5–15)
BILIRUBIN TOTAL: 1.1 mg/dL (ref 0.3–1.2)
BUN: 13 mg/dL (ref 6–20)
CALCIUM: 9.6 mg/dL (ref 8.9–10.3)
CO2: 26 mmol/L (ref 22–32)
Chloride: 101 mmol/L (ref 101–111)
Creatinine, Ser: 1.24 mg/dL (ref 0.61–1.24)
GFR calc Af Amer: >60 mL/min (ref >60)
GFR calc non Af Amer: >60 mL/min (ref >60)
GLUCOSE: 115 mg/dL — AB (ref 65–99)
Potassium: 3.8 mmol/L (ref 3.5–5.1)
Sodium: 138 mmol/L (ref 135–145)
TOTAL PROTEIN: 8.9 g/dL — AB (ref 6.5–8.1)

## 2017-04-13 LAB — CBC
HCT: 48.6 % (ref 40.0–52.0)
HEMOGLOBIN: 16.1 g/dL (ref 13.0–18.0)
MCH: 27.1 pg (ref 26.0–34.0)
MCHC: 33.1 g/dL (ref 32.0–36.0)
MCV: 82 fL (ref 80.0–100.0)
PLATELETS: 223 10*3/uL (ref 150–440)
RBC: 5.93 MIL/uL — ABNORMAL HIGH (ref 4.40–5.90)
RDW: 12.8 % (ref 11.5–14.5)
WBC: 5.5 10*3/uL (ref 3.8–10.6)

## 2017-04-13 LAB — URINALYSIS, COMPLETE (UACMP) WITH MICROSCOPIC
Bilirubin Urine: NEGATIVE
GLUCOSE, UA: NEGATIVE mg/dL
Hgb urine dipstick: NEGATIVE
KETONES UR: 5 mg/dL — AB
Leukocytes, UA: NEGATIVE
Nitrite: NEGATIVE
PROTEIN: 30 mg/dL — AB
SQUAMOUS EPITHELIAL / LPF: NONE SEEN
Specific Gravity, Urine: 1.03 (ref 1.005–1.030)
pH: 6 (ref 5.0–8.0)

## 2017-04-13 LAB — CK: Total CK: 85 U/L (ref 49–397)

## 2017-04-13 LAB — URINE DRUG SCREEN, QUALITATIVE (ARMC ONLY)
Amphetamines, Ur Screen: NOT DETECTED
BARBITURATES, UR SCREEN: NOT DETECTED
Benzodiazepine, Ur Scrn: POSITIVE — AB
CANNABINOID 50 NG, UR ~~LOC~~: NOT DETECTED
COCAINE METABOLITE, UR ~~LOC~~: NOT DETECTED
MDMA (Ecstasy)Ur Screen: NOT DETECTED
Methadone Scn, Ur: NOT DETECTED
Opiate, Ur Screen: NOT DETECTED
PHENCYCLIDINE (PCP) UR S: NOT DETECTED
TRICYCLIC, UR SCREEN: NOT DETECTED

## 2017-04-13 LAB — ETHANOL: Alcohol, Ethyl (B): <10 mg/dL (ref <10)

## 2017-04-13 MED ORDER — ZOLPIDEM TARTRATE 10 MG PO TABS: 10.0000 mg | ORAL_TABLET | Freq: Every evening | ORAL | 0 refills | Status: DC | PRN

## 2017-04-13 MED ORDER — QUETIAPINE FUMARATE 200 MG PO TABS: 200.0000 mg | ORAL_TABLET | Freq: Every day | ORAL | 0 refills | Status: DC

## 2017-04-13 MED ORDER — QUETIAPINE FUMARATE 50 MG PO TABS: 50.0000 mg | ORAL_TABLET | Freq: Every day | ORAL | 1 refills | Status: DC

## 2017-04-13 NOTE — ED Provider Notes (Addendum)
[facility] Emergency Department Provider Note       Time seen: ----------------------------------------- 1:33 PM on 04/13/2017 -----------------------------------------   I have reviewed the triage vital signs and the nursing notes.  HISTORY   Chief Complaint Behavioral Med Evaluation    HPI Louis Newton is a 32 y.o. male with a history of anxiety, psychosis and rhabdomyolysis who presents for multiple complaints.  Patient was seen here recently and treated for influenza.  He states he has had poor appetite as well as difficulty sleeping and cold sweats at night.  He has been diagnosed with anxiety.  Patient states he stopped taking Tamiflu because it was giving him stomach upset.   Past Medical History:  Diagnosis Date  . Anxiety   . 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 Preventative health care 09/22/2016  . 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 Involuntary commitment 09/07/2015    History reviewed. No pertinent surgical history.  Allergies Bee venom  Social History Social History   Tobacco Use  . Smoking status: Never Smoker  . Smokeless tobacco: Never Used  Substance Use Topics  . Alcohol use: Yes    Alcohol/week: 0.0 oz    Comment: social  . Drug use: No    Review of Systems Constitutional: Negative for fever. Cardiovascular: Negative for chest pain. Respiratory: Negative for shortness of breath. Gastrointestinal: Negative for abdominal pain, vomiting and diarrhea.  Positive for poor appetite Musculoskeletal: Negative for back pain. Skin: Negative for rash. Neurological: Negative for headaches, focal weakness or numbness. Psychiatric: Positive for anxiety, insomnia  All systems  negative/normal/unremarkable except as stated in the HPI  ____________________________________________   PHYSICAL EXAM:  VITAL SIGNS: ED Triage Vitals  Enc Vitals Group     BP 04/13/17 1226 (!) 151/94     Pulse Rate 04/13/17 1226 79     Resp 04/13/17 1226 20     Temp 04/13/17 1228 98.1 F (36.7 C)     Temp Source 04/13/17 1226 Oral     SpO2 04/13/17 1226 99 %     Weight 04/13/17 1227 159 lb (72.1 kg)     Height 04/13/17 1227 5\' 9"  (1.753 m)     Head Circumference --      Peak Flow --      Pain Score 04/13/17 1244 5     Pain Loc --      Pain Edu? --      Excl. in GC? --    Constitutional: Alert and oriented. Well appearing and in no distress. Eyes: Conjunctivae are normal. Normal extraocular movements. ENT   Head: Normocephalic and atraumatic.   Nose: No congestion/rhinnorhea.   Mouth/Throat: Mucous membranes are moist.   Neck: No stridor. Cardiovascular: Normal rate, regular rhythm. No murmurs, rubs, or gallops. Respiratory: Normal respiratory effort without tachypnea nor retractions. Breath sounds are clear and equal bilaterally. No wheezes/rales/rhonchi. Gastrointestinal: Soft and nontender. Normal bowel sounds Musculoskeletal: Nontender with normal range of motion in extremities. No lower extremity tenderness nor edema. Neurologic:  Normal speech and language. No gross focal neurologic deficits are appreciated.  Skin:  Skin is warm, dry and intact. No rash noted. Psychiatric: Anxious mood and affect ___________________________________________  ED COURSE:  As part of my medical decision making, I reviewed the following data within  the electronic MEDICAL RECORD NUMBER History obtained from family if available, nursing notes, old chart and ekg, as well as notes from prior ED visits. Patient presented for multiple complaints after recent diagnosis of influenza, we will assess with labs and imaging as indicated at this time.    Procedures ____________________________________________   LABS (pertinent positives/negatives)  Labs Reviewed  COMPREHENSIVE METABOLIC PANEL - Abnormal; Notable for the following components:      Result Value   Glucose, Bld 115 (*)    Total Protein 8.9 (*)    All other components within normal limits  CBC - Abnormal; Notable for the following components:   RBC 5.93 (*)    All other components within normal limits  URINE DRUG SCREEN, QUALITATIVE ([facility] ONLY) - Abnormal; Notable for the following components:   Benzodiazepine, Ur Scrn POSITIVE (*)    All other components within normal limits  URINALYSIS, COMPLETE (UACMP) WITH MICROSCOPIC - Abnormal; Notable for the following components:   Color, Urine YELLOW (*)    APPearance CLEAR (*)    Ketones, ur 5 (*)    Protein, ur 30 (*)    Bacteria, UA RARE (*)    All other components within normal limits  ETHANOL  CK  ___________________________________________  DIFFERENTIAL DIAGNOSIS   Anxiety, depression, medication side effect, electrolyte abnormality, arrhythmia, anemia  FINAL ASSESSMENT AND PLAN  Anxiety, insomnia   Plan: Patient had presented for multiple complaints. Patient's labs were negative.  He will be given medication for insomnia and he is cleared for outpatient follow-up.   Ulice Dash, MD   Note: This note was generated in part or whole with voice recognition software. Voice recognition is usually quite accurate but there are transcription errors that can and very often do occur. I apologize for any typographical errors that were not detected and corrected.     Emily Filbert, MD 04/13/17 1340    Emily Filbert, MD 04/13/17 [phone]

## 2017-04-13 NOTE — Consult Note (Signed)
[facility] Psychiatry Consult   Reason for Consult: Consult for this 32 year old man who came here voluntarily because of anxiety and sleeplessness Referring Physician: Archie Balboa Patient Identification: Louis Newton MRN:  [id number] Principal Diagnosis: Adjustment disorder with anxiety Diagnosis:   Patient Active Problem List   Diagnosis Date Noted  . Adjustment disorder with anxiety [F43.22] 04/13/2017  . Influenza A [J10.1] 04/13/2017  . Preventative health care [Z00.00] 09/22/2016  . 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 History of rhabdomyolysis [Z87.39] 09/07/2015  . 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 Involuntary commitment [Z04.6] 09/07/2015    Total Time spent with patient: 1 hour  Subjective:   Louis Newton is a 32 y.o. male patient admitted with "I just let it get the best of me".  HPI: Patient interviewed chart reviewed.  This is a 32 year old man who has been very anxious for the past week.  He times the onset of this to his coming down with influenza that was diagnosed just about a week ago.  He was feeling sick and run down and feverish and having night sweats.  After getting the diagnosis his anxiety got even worse.  It sounds like for the last several days he has slept only a couple hours a night, has been feeling nervous and worried constantly, thinking about worse case scenarios of his sickness.  He is been back to the doctor several times.  He was here in the emergency room yesterday and got a low dose benzodiazepine but that did not help him sleep well last night.  Patient has good insight into this.  Does not report any kind of psychotic symptoms.  Not having hallucinations.  Not having any suicidal or homicidal thoughts.  Has not behaved in a bizarre or potentially dangerous manner.  He is not drinking and not abusing any drugs.  Other than this his life does not have any uniquely bad stresses that  have come on.  Medical history: Patient was diagnosed with influenza about a week ago.  Sounds like a relatively mild course with just some fevers and a little bit of stuffy nose and a little bit of sickness to the stomach but nothing severe and not severe respiratory symptoms.  Social history: He is in a long-term relationship goes back and forth between his mother and his girlfriend.  Works full-time.  Sounds like he feels like he has good social support.  Substance abuse history: Drinks only occasionally and says any has not had any alcohol in over a month.  Does not use any other drugs.  Past Psychiatric History: This patient had an episode in 2017 where he became anxious and sleepless and got very revved up in his behavior with fear and nervousness to the point of actually getting psychotic.  He wound up in the hospital with rhabdomyolysis because of dehydration and hyperactivity.  He was treated briefly on the psychiatric service with Seroquel and discharge.  Followed up for a little while but has not been on the medicine in quite some time.  Other than that no psychiatric hospitalizations no history of suicide no other known mental health problems.  Risk to Self: Is patient at risk for suicide?: No Risk to Others:   Prior Inpatient Therapy:   Prior Outpatient Therapy:    Past Medical History:  Past Medical History:  Diagnosis Date  . Anxiety   . 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 Rhabdomyolysis 09/07/2015  History reviewed. No pertinent surgical history. Family History:  Family History  Problem Relation Age of Onset  . Kidney disease Father   . Heart disease Maternal Grandmother   . Dementia Paternal Grandfather   . Epilepsy Brother    Family Psychiatric  History: Unknown Social History:  Social History   Substance and Sexual Activity  Alcohol Use Yes  . Alcohol/week: 0.0 oz   Comment: social     Social History   Substance and Sexual  Activity  Drug Use No    Social History   Socioeconomic History  . Marital status: Single    Spouse name: None  . Number of children: None  . Years of education: None  . Highest education level: None  Social Needs  . Financial resource strain: None  . Food insecurity - worry: None  . Food insecurity - inability: None  . Transportation needs - medical: None  . Transportation needs - non-medical: None  Occupational History  . None  Tobacco Use  . Smoking status: Never Smoker  . Smokeless tobacco: Never Used  Substance and Sexual Activity  . Alcohol use: Yes    Alcohol/week: 0.0 oz    Comment: social  . Drug use: No  . Sexual activity: Yes    Birth control/protection: None  Other Topics Concern  . None  Social History Narrative  . None   Additional Social History:    Allergies:   Allergies  Allergen Reactions  . Bee Venom Swelling    Labs:  Results for orders placed or performed during the hospital encounter of 04/13/17 (from the past 48 hour(s))  Comprehensive metabolic panel     Status: Abnormal   Collection Time: 04/13/17 12:43 PM  Result Value Ref Range   Sodium 138 135 - 145 mmol/L   Potassium 3.8 3.5 - 5.1 mmol/L   Chloride 101 101 - 111 mmol/L   CO2 26 22 - 32 mmol/L   Glucose, Bld 115 (H) 65 - 99 mg/dL   BUN 13 6 - 20 mg/dL   Creatinine, Ser 1.24 0.61 - 1.24 mg/dL   Calcium 9.6 8.9 - 10.3 mg/dL   Total Protein 8.9 (H) 6.5 - 8.1 g/dL   Albumin 4.7 3.5 - 5.0 g/dL   AST 21 15 - 41 U/L   ALT 18 17 - 63 U/L   Alkaline Phosphatase 64 38 - 126 U/L   Total Bilirubin 1.1 0.3 - 1.2 mg/dL   GFR calc non Af Amer >60 >60 mL/min   GFR calc Af Amer >60 >60 mL/min    Comment: (NOTE) The eGFR has been calculated using the CKD EPI equation. This calculation has not been validated in all clinical situations. eGFR's persistently <60 mL/min signify possible Chronic Kidney Disease.    Anion gap 11 5 - 15    Comment: Performed at [facility], [address] [place], [place] [address]  Ethanol     Status: None   Collection Time: 04/13/17 12:43 PM  Result Value Ref Range   Alcohol, Ethyl (B) <10 <10 mg/dL    Comment:        LOWEST DETECTABLE LIMIT FOR SERUM ALCOHOL IS 10 mg/dL FOR MEDICAL PURPOSES ONLY Performed at [facility], [address] [place], [place] [address]   cbc     Status: Abnormal   Collection Time: 04/13/17 12:43 PM  Result Value Ref Range   WBC 5.5 3.8 - 10.6 K/uL   RBC 5.93 (H) 4.40 - 5.90 MIL/uL  Hemoglobin 16.1 13.0 - 18.0 g/dL   HCT 48.6 40.0 - 52.0 %   MCV 82.0 80.0 - 100.0 fL   MCH 27.1 26.0 - 34.0 pg   MCHC 33.1 32.0 - 36.0 g/dL   RDW 12.8 11.5 - 14.5 %   Platelets 223 150 - 440 K/uL    Comment: Performed at [facility], [address] [place], [place] [address]  Urine Drug Screen, Qualitative     Status: Abnormal   Collection Time: 04/13/17 12:43 PM  Result Value Ref Range   Tricyclic, Ur Screen NONE DETECTED NONE DETECTED   Amphetamines, Ur Screen NONE DETECTED NONE DETECTED   MDMA (Ecstasy)Ur Screen NONE DETECTED NONE DETECTED   Cocaine Metabolite,Ur [place] NONE DETECTED NONE DETECTED   Opiate, Ur Screen NONE DETECTED NONE DETECTED   Phencyclidine (PCP) Ur S NONE DETECTED NONE DETECTED   Cannabinoid 50 Ng, Ur [place] NONE DETECTED NONE DETECTED   Barbiturates, Ur Screen NONE DETECTED NONE DETECTED   Benzodiazepine, Ur Scrn POSITIVE (A) NONE DETECTED   Methadone Scn, Ur NONE DETECTED NONE DETECTED    Comment: (NOTE) Tricyclics + metabolites, urine    Cutoff 1000 ng/mL Amphetamines + metabolites, urine  Cutoff 1000 ng/mL MDMA (Ecstasy), urine              Cutoff 500 ng/mL Cocaine Metabolite, urine          Cutoff 300 ng/mL Opiate + metabolites, urine        Cutoff 300 ng/mL Phencyclidine (PCP), urine         Cutoff 25 ng/mL Cannabinoid, urine                 Cutoff 50 ng/mL Barbiturates + metabolites, urine  Cutoff 200 ng/mL Benzodiazepine, urine              Cutoff 200  ng/mL Methadone, urine                   Cutoff 300 ng/mL The urine drug screen provides only a preliminary, unconfirmed analytical test result and should not be used for non-medical purposes. Clinical consideration and professional judgment should be applied to any positive drug screen result due to possible interfering substances. A more specific alternate chemical method must be used in order to obtain a confirmed analytical result. Gas chromatography / mass spectrometry (GC/MS) is the preferred confirmat ory method. Performed at [facility], [address] [place], [place] [address]   CK     Status: None   Collection Time: 04/13/17 12:43 PM  Result Value Ref Range   Total CK 85 49 - 397 U/L    Comment: Performed at [facility], [address] [place],  [address]  Urinalysis, Complete w Microscopic     Status: Abnormal   Collection Time: 04/13/17 12:43 PM  Result Value Ref Range   Color, Urine YELLOW (A) YELLOW   APPearance CLEAR (A) CLEAR   Specific Gravity, Urine 1.030 1.005 - 1.030   pH 6.0 5.0 - 8.0   Glucose, UA NEGATIVE NEGATIVE mg/dL   Hgb urine dipstick NEGATIVE NEGATIVE   Bilirubin Urine NEGATIVE NEGATIVE   Ketones, ur 5 (A) NEGATIVE mg/dL   Protein, ur 30 (A) NEGATIVE mg/dL   Nitrite NEGATIVE NEGATIVE   Leukocytes, UA NEGATIVE NEGATIVE   RBC / HPF 0-5 0 - 5 RBC/hpf   WBC, UA 0-5 0 - 5 WBC/hpf   Bacteria, UA RARE (A) NONE SEEN   Squamous Epithelial / LPF  NONE SEEN NONE SEEN   Mucus PRESENT     Comment: Performed at [facility], [address] [place], [place] [address]    No current facility-administered medications for this encounter.    Current Outpatient Medications  Medication Sig Dispense Refill  . fexofenadine (ALLEGRA ALLERGY) 180 MG tablet Take 1 tablet (180 mg total) by mouth daily. 15 tablet 0  . LORazepam (ATIVAN) 1 MG tablet Take 1 tablet (1 mg total) by mouth at bedtime for 3 days. 3 tablet 0  .  propranolol (INDERAL) 20 MG tablet Take 1 tablet (20 mg total) by mouth 2 (two) times daily as needed. 20 tablet 0  . QUEtiapine (SEROQUEL) 200 MG tablet Take 1 tablet (200 mg total) by mouth at bedtime. 30 tablet 0  . sodium chloride (OCEAN) 0.65 % SOLN nasal spray Place 1 spray into both nostrils as needed for congestion. 88 mL 0  . zolpidem (AMBIEN) 10 MG tablet Take 1 tablet (10 mg total) by mouth at bedtime as needed for sleep. 30 tablet 0    Musculoskeletal: Strength & Muscle Tone: within normal limits Gait & Station: normal Patient leans: N/A  Psychiatric Specialty Exam: Physical Exam  Nursing note and vitals reviewed. Constitutional: He appears well-developed and well-nourished.  HENT:  Head: Normocephalic and atraumatic.  Eyes: Conjunctivae are normal. Pupils are equal, round, and reactive to light.  Neck: Normal range of motion.  Cardiovascular: Regular rhythm and normal heart sounds.  Respiratory: Effort normal. No respiratory distress.  GI: Soft.  Musculoskeletal: Normal range of motion.  Neurological: He is alert.  Skin: Skin is warm and dry.  Psychiatric: His speech is normal and behavior is normal. Judgment and thought content normal. His mood appears anxious. Cognition and memory are normal.    Review of Systems  Constitutional: Negative.   HENT: Negative.   Eyes: Negative.   Respiratory: Negative.   Cardiovascular: Negative.   Gastrointestinal: Negative.   Musculoskeletal: Negative.   Skin: Negative.   Neurological: Negative.   Psychiatric/Behavioral: Negative for depression, hallucinations, memory loss, substance abuse and suicidal ideas. The patient is nervous/anxious and has insomnia.     Blood pressure (!) 151/94, pulse 79, temperature 98.1 F (36.7 C), temperature source Oral, resp. rate 20, height 5' 9" (1.753 m), weight 72.1 kg (159 lb), SpO2 99 %.Body mass index is 23.48 kg/m.  General Appearance: Fairly Groomed  Eye Contact:  Fair  Speech:  Clear  and Coherent  Volume:  Normal  Mood:  Anxious  Affect:  Congruent  Thought Process:  Goal Directed  Orientation:  Full (Time, Place, and Person)  Thought Content:  Logical and Rumination  Suicidal Thoughts:  No  Homicidal Thoughts:  No  Memory:  Immediate;   Good Recent;   Fair Remote;   Fair  Judgement:  Fair  Insight:  Fair  Psychomotor Activity:  Normal  Concentration:  Concentration: Fair  Recall:  [employer] of Knowledge:  Fair  Language:  Fair  Akathisia:  No  Handed:  Right  AIMS (if indicated):     Assets:  Communication Skills Desire for Improvement Financial Resources/Insurance Housing Physical Health Resilience Social Support  ADL's:  Intact  Cognition:  WNL  Sleep:        Treatment Plan Summary: Medication management and Plan 32 year old man with a past history of a reactive psychotic episode who comes back to the hospital with extreme anxiety and reaction to his recent sickness with influenza.  In contrast to the last  time we treated him he is showing good insight, he understands that this is anxiety related to his illness, he is not acting out in a dangerous manner and is very cooperative and agreeable to treatment.  Patient does not appear to be acutely dangerous and does not meet commitment criteria.  Does not require inpatient psychiatric hospitalization.  I recommend that we give him a prescription to restart Seroquel.  Last time he was in the hospital he wound up on 300 mg at night.  Rather than go back to the full dose so quickly I will give him a prescription for 200 mg at night.  This should be enough to help him sleep and calm down and get his appetite back.  Patient followed up in the past with [facility] and can do that in the future as well.  Case reviewed with emergency room physician and nursing.  Disposition: No evidence of imminent risk to self or others at present.   Patient does not meet criteria for psychiatric inpatient admission. Supportive therapy  provided about ongoing stressors.  Alethia Berthold, MD 04/13/2017 4:04 PM

## 2017-04-13 NOTE — ED Notes (Signed)
BEHAVIORAL HEALTH ROUNDING Patient sleeping: No. Patient alert and oriented: yes Behavior appropriate: Yes.  ; If no, describe:  Nutrition and fluids offered: yes Toileting and hygiene offered: Yes  Sitter present: q15 minute observations and security  monitoring Law enforcement present: Yes  ODS  

## 2017-04-13 NOTE — Assessment & Plan Note (Signed)
No dark urine, but no urination for several hours

## 2017-04-13 NOTE — Progress Notes (Addendum)
BP 126/80 (BP Location: Left Arm, Patient Position: Sitting, Cuff Size: Normal)   Pulse (!) 101   Temp 97.8 F (36.6 C) (Oral)   Ht 5\' 9"  (1.753 m)   Wt 158 lb (71.7 kg)   SpO2 97%   BMI 23.33 kg/m    Subjective:    Patient ID: Louis Newton, male    DOB: [birth year], 32 y.o.   MRN: [id number]  HPI: Louis Newton is a 32 y.o. male  Chief Complaint  Patient presents with  . Manic Behavior    Pt states that he believes his manic behavior has returned, very anxious since flu dx, can't sleep, not eating   . Panic Attack    Was told to schedule appt ASAP with Dr.Kapur     HPI Patient is here with his brother; concerned about manic behavior Can't sleep, can't eat; has lost 6 pounds over the last week They are going to get him set up with Dr. Maryruth Bun but has not seen her yet; he is currently not taking any antipsychotics He thinks this episode was triggered by illness like his last bad one in 2017 Had really bad SHOB doing down the steps; no wheezing; gas, indigestion He had influenza A and took some tamiflu Hasn't urinated today, last urination was last night before going to bed (patient seen around 11:00-11:30 am) He slept some last night; deep sweat; soaking wet with sweat last night Has been having trouble sleeping They (family, mother and brother) does not think it's just psych Has been twitching in his muscles Excessive gas, indigestion Jittery and headaches on the left side, still wisdom teeth on the left side, does not hurt all the time  Tightness on the left side in the arm Patient is suspicious, jumping at every little sound in the house Hx of manic episode a few years ago, was found naked running in the woods, went into rhabdomyolysis; that episode may have been triggered by infection Some religious grandiosity  Two Thursdays ago he felt totally fine; that whole week, he felt fine, sleeping well, etc.; took the week off and relaxed, worked on the truck, warm  outside; then Friday was a little different; resting more; not depressed Feels warm to the touch On Monday night, slept with heat going and fan running; room was burning up; was getting sweaty and chilled, then got the flu; positive Flu Feb 13th  Edinburgh Postnatal Depression Scale - 04/13/17 1110      Edinburgh Postnatal Depression Scale:  In the Past 7 Days   I have been able to laugh and see the funny side of things.  1    I have looked forward with enjoyment to things.  3    I have blamed myself unnecessarily when things went wrong.  2    I have been anxious or worried for no good reason.  3    I have felt scared or panicky for no good reason.  3    Things have been getting on top of me.  3    I have been so unhappy that I have had difficulty sleeping.  3    I have felt sad or miserable.  2    I have been so unhappy that I have been crying.  2    The thought of harming myself has occurred to me.  0    Edinburgh Postnatal Depression Scale Total  22       Depression screen [facility] 2/9  09/22/2016 04/01/2016 09/05/2015  Decreased Interest 0 0 0  Down, Depressed, Hopeless 0 0 1  PHQ - 2 Score 0 0 1   GAD 7 : Generalized Anxiety Score 04/13/2017  Nervous, Anxious, on Edge 3  Control/stop worrying 3  Worry too much - different things 3  Trouble relaxing 3  Restless 3  Easily annoyed or irritable 1  Afraid - awful might happen 3  Total GAD 7 Score 19  Anxiety Difficulty Extremely difficult   Relevant past medical, surgical, family and social history reviewed Past Medical History:  Diagnosis Date  . Anxiety   . 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 Rhabdomyolysis 09/07/2015   History reviewed. No pertinent surgical history. Family History  Problem Relation Age of Onset  . Kidney disease Father   . Heart disease Maternal Grandmother   . Dementia Paternal Grandfather   . Epilepsy Brother     Social History   Tobacco Use  . Smoking status: Never  Smoker  . Smokeless tobacco: Never Used  Substance Use Topics  . Alcohol use: Yes    Alcohol/week: 0.0 oz    Comment: social  . Drug use: No    Interim medical history since last visit reviewed. Allergies and medications reviewed  Review of Systems Per HPI unless specifically indicated above     Objective:    BP 126/80 (BP Location: Left Arm, Patient Position: Sitting, Cuff Size: Normal)   Pulse (!) 101   Temp 97.8 F (36.6 C) (Oral)   Ht 5\' 9"  (1.753 m)   Wt 158 lb (71.7 kg)   SpO2 97%   BMI 23.33 kg/m   Wt Readings from Last 3 Encounters:  04/13/17 158 lb (71.7 kg)  04/08/17 164 lb (74.4 kg)  04/07/17 164 lb 12.8 oz (74.8 kg)    Physical Exam  Constitutional: He appears well-developed and well-nourished.  Weight loss of 6 pounds in the last week  HENT:  Nose: No rhinorrhea.  Oral mucous membranes are tacky  Eyes: No scleral icterus.  Neck: No thyromegaly present.  Cardiovascular: Regular rhythm.  No extrasystoles are present. Tachycardia present.  Pulmonary/Chest: Effort normal and breath sounds normal. He has no decreased breath sounds. He has no wheezes.  Abdominal: Bowel sounds are normal. He exhibits no distension.  Neurological: He displays no tremor.  No tics  Skin: He is not diaphoretic. No pallor.  Very warm to the touch  Psychiatric: His mood appears anxious. His affect is not inappropriate. He is hyperactive. He is not aggressive, not actively hallucinating and not combative. He exhibits a depressed mood (tearful, worried).  Good eye contact with examiner; restless, anxious; voices concern that he might have a heart attack or aneurysm    Results for orders placed or performed in visit on 04/07/17  POCT Influenza A/B  Result Value Ref Range   Influenza A, POC Positive (A) Negative   Influenza B, POC Negative Negative      Assessment & Plan:   Problem List Items Addressed This Visit      Other   History of rhabdomyolysis    No dark urine, but  no urination for several hours       Other Visit Diagnoses    Night sweats    -  Primary   would appreciate ER considering CBC, TSH, renal function (since no urination for 10-12 hours?)   Hypomania (HCC)       sending to the ER for evaluation; would appreciate psych  eval after clearing for medical conditions such as hypothryoidism; antipsychotic helped in the past   Muscle twitch       sending to ER, may need Mg2+, K+, TSH free T4 checked   Dehydration       decreased PO intake, may need Cr checked      Suspect patient has bipolar disorder, having another episode similar to the episode in 2017, triggered by infection (?); he benefitted in the past from hospitalization, medication management; [facility] with my personal cell number; I also personaly called ER to report him coming voluntarily by private vehicle with his brother  Follow up plan: No Follow-up on file.  An after-visit summary was printed and given to the patient at check-out.  Please see the patient instructions which may contain other information and recommendations beyond what is mentioned above in the assessment and plan.  Face-to-face time with patient was more than 40 minutes, >50% time spent counseling and coordination of care

## 2017-04-13 NOTE — ED Notes (Signed)

## 2017-04-13 NOTE — ED Notes (Signed)
Pt dressed out per this RN and ED tech, [employer]-PloughCrystal.  Belongings (Clothing, Shoes, [employer] Phone) handed off to pt's brother, AuburnJustin Taft at this time.

## 2017-04-13 NOTE — ED Triage Notes (Signed)
Pt in via POV; advised to be evaluated here per PCP, pt with multiple complaints; cold sweats at night, difficulty sleeping, decreased appetite.  Pt with hx of anxiety.  Pt currently being treated for the flu.  NAD noted at this time.

## 2017-04-13 NOTE — Patient Instructions (Addendum)
When you take Advil (ibuprofen), the maximum is twelve pills per 24 hours Please do go to the ER

## 2017-07-23 IMAGING — CT CT HEAD W/O CM
3 series · 15 of 47 positions shown, 18 images · non-contrast
Comparison: None.

CLINICAL DATA: New onset of psychosis.

EXAM:
CT HEAD WITHOUT CONTRAST
TECHNIQUE: Contiguous axial images were obtained from the base of the skull
through the vertex without intravenous contrast.

[Series 2: head wo · axial · 0.41mm/px · z∈[-143,-18]mm · 9 of 30 slices shown, 12 images]
[im 3/30  brain]
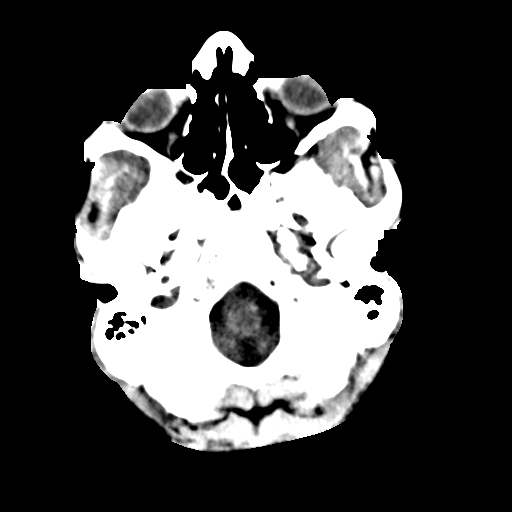
[im 3/30  bone]
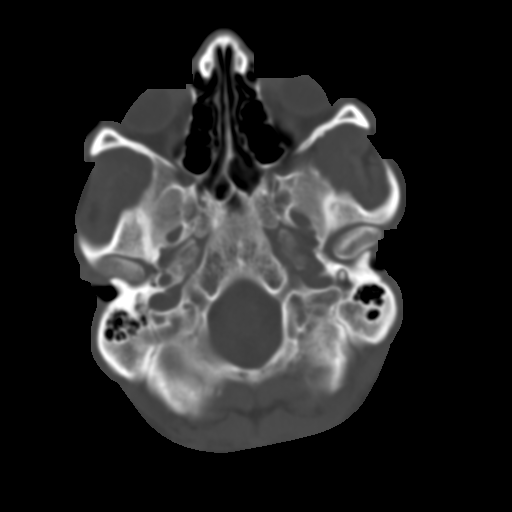
[im 6/30  brain]
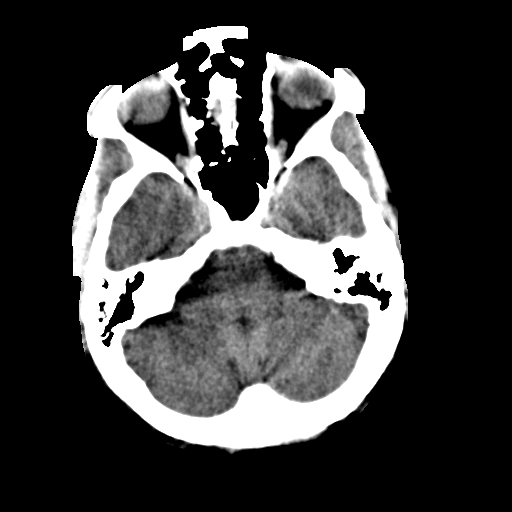
[im 9/30  brain]
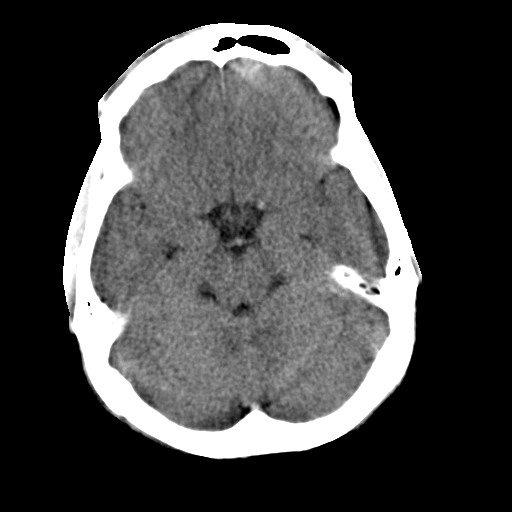
[im 12/30  brain]
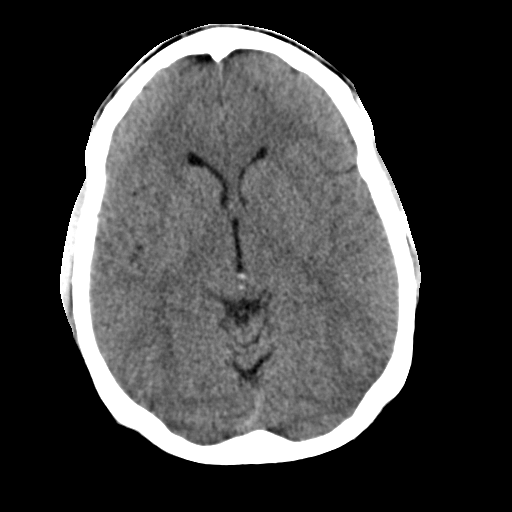
[im 16/30  brain]
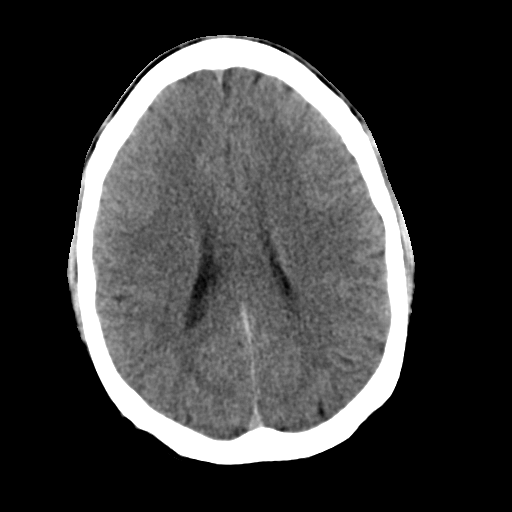
[im 16/30  bone]
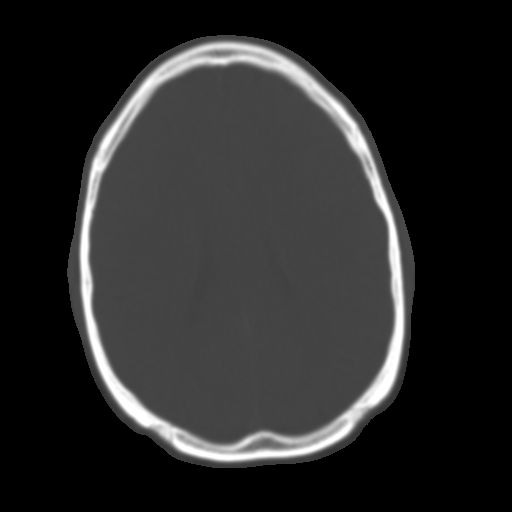
[im 19/30  brain]
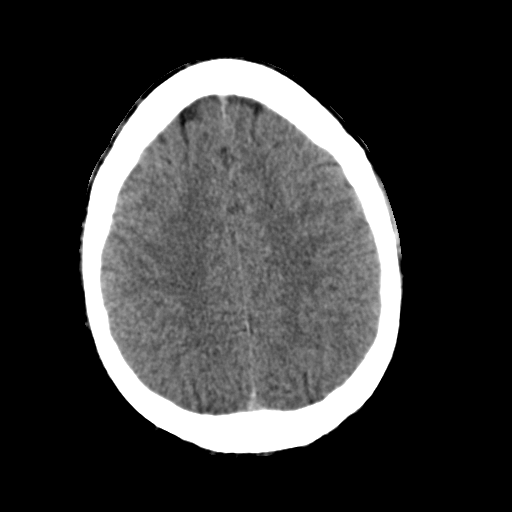
[im 22/30  brain]
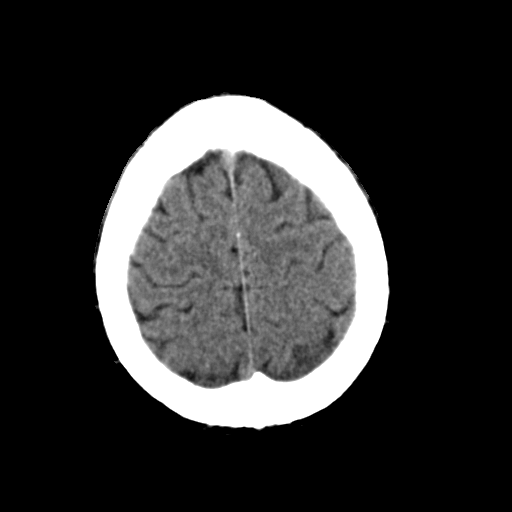
[im 25/30  brain]
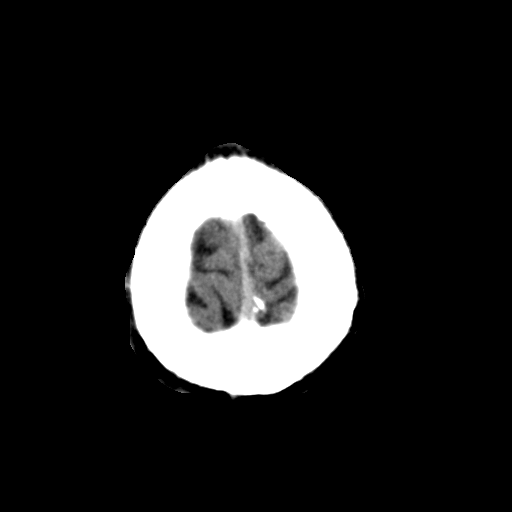
[im 28/30  brain]
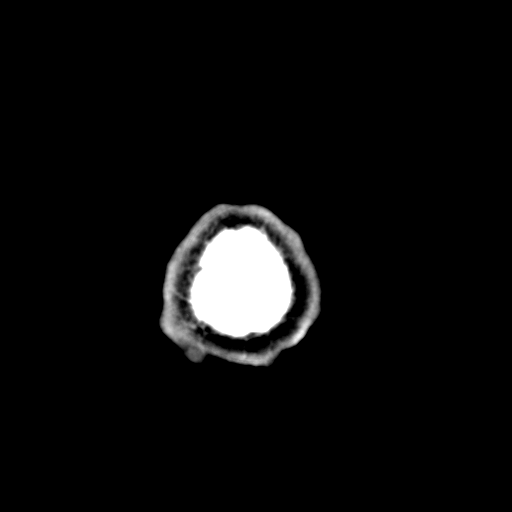
[im 28/30  bone]
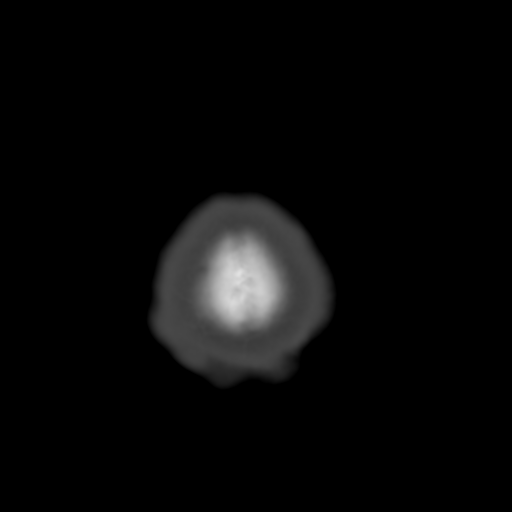

[Series 4: coronal soft tissue · coronal · 0.29mm/px · 3 of 61 slices shown]
[im 21/61  brain]
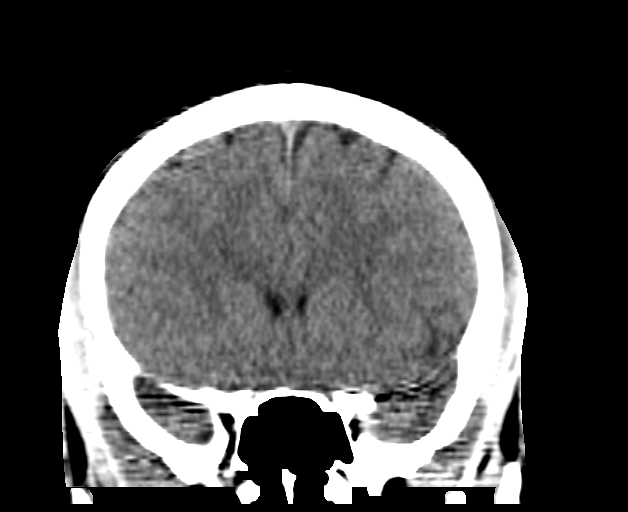
[im 27/61  brain]
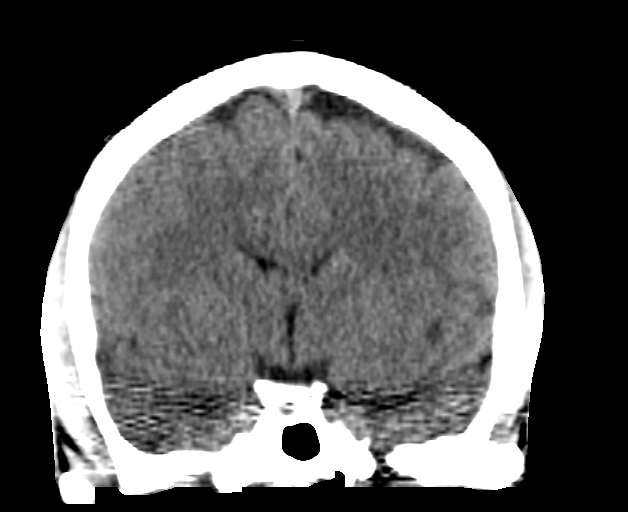
[im 34/61  brain]
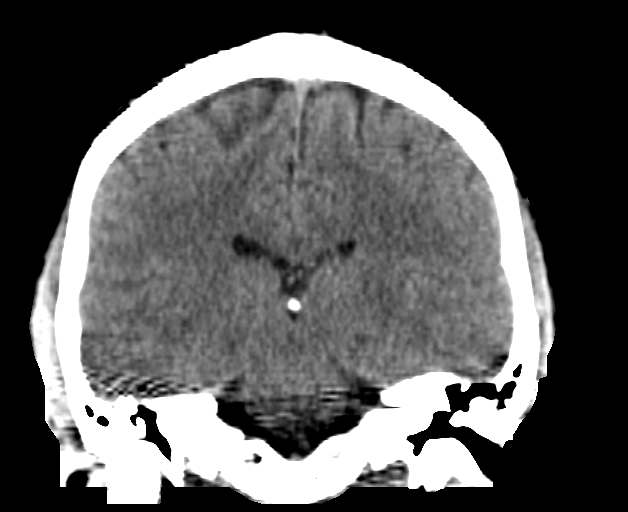

[Series 5: sagittal soft tissue · sagittal · 0.31mm/px · 3 of 51 slices shown]
[im 17/51  brain]
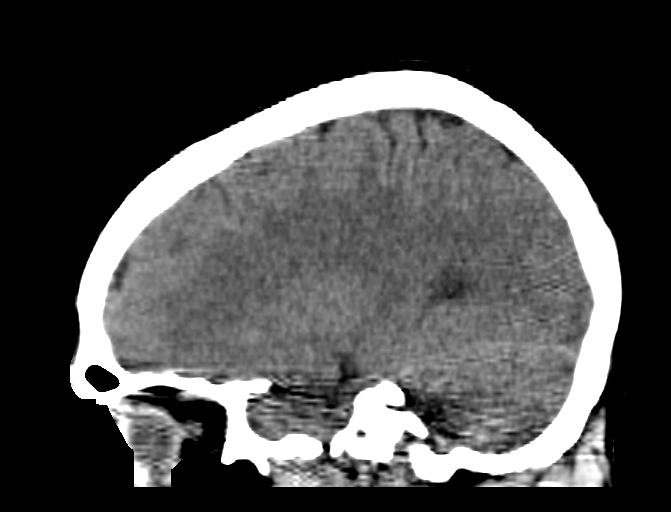
[im 26/51  brain]
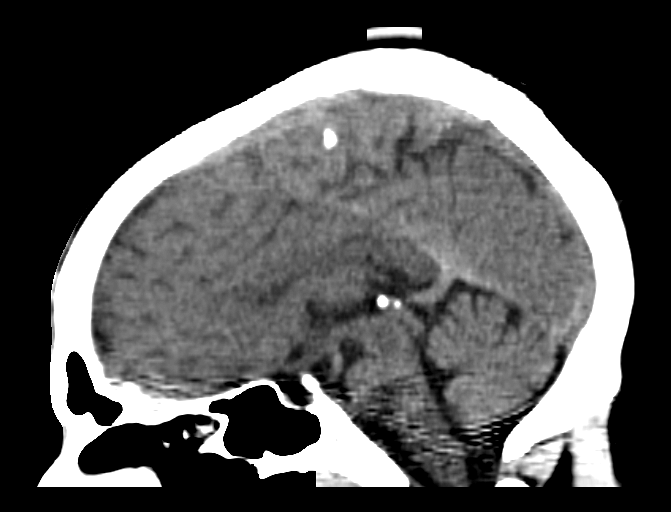
[im 34/51  brain]
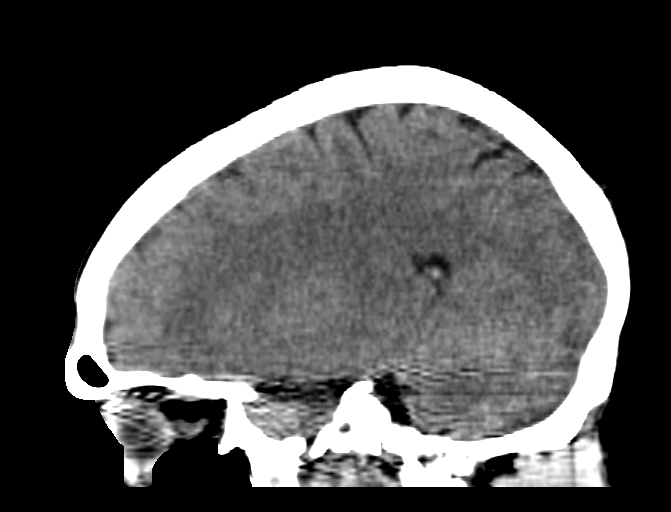

[15 of 47 positions shown; findings below may reference images not displayed]

FINDINGS: The brain has a normal appearance without evidence of malformation,
atrophy, old or acute infarction, mass lesion, hemorrhage,
hydrocephalus or extra-axial collection. The calvarium is
unremarkable. The paranasal sinuses, middle ears and mastoids are
clear.
IMPRESSION: Normal head CT

## 2019-02-18 IMAGING — CR DG CHEST 2V
2 series · 2 of 2 positions shown · non-contrast
Comparison: Chest radiograph January 10, 2013

CLINICAL DATA: Fever and congestion. Diagnosed with influenza
yesterday.

EXAM:
CHEST  2 VIEW

[chest pa]
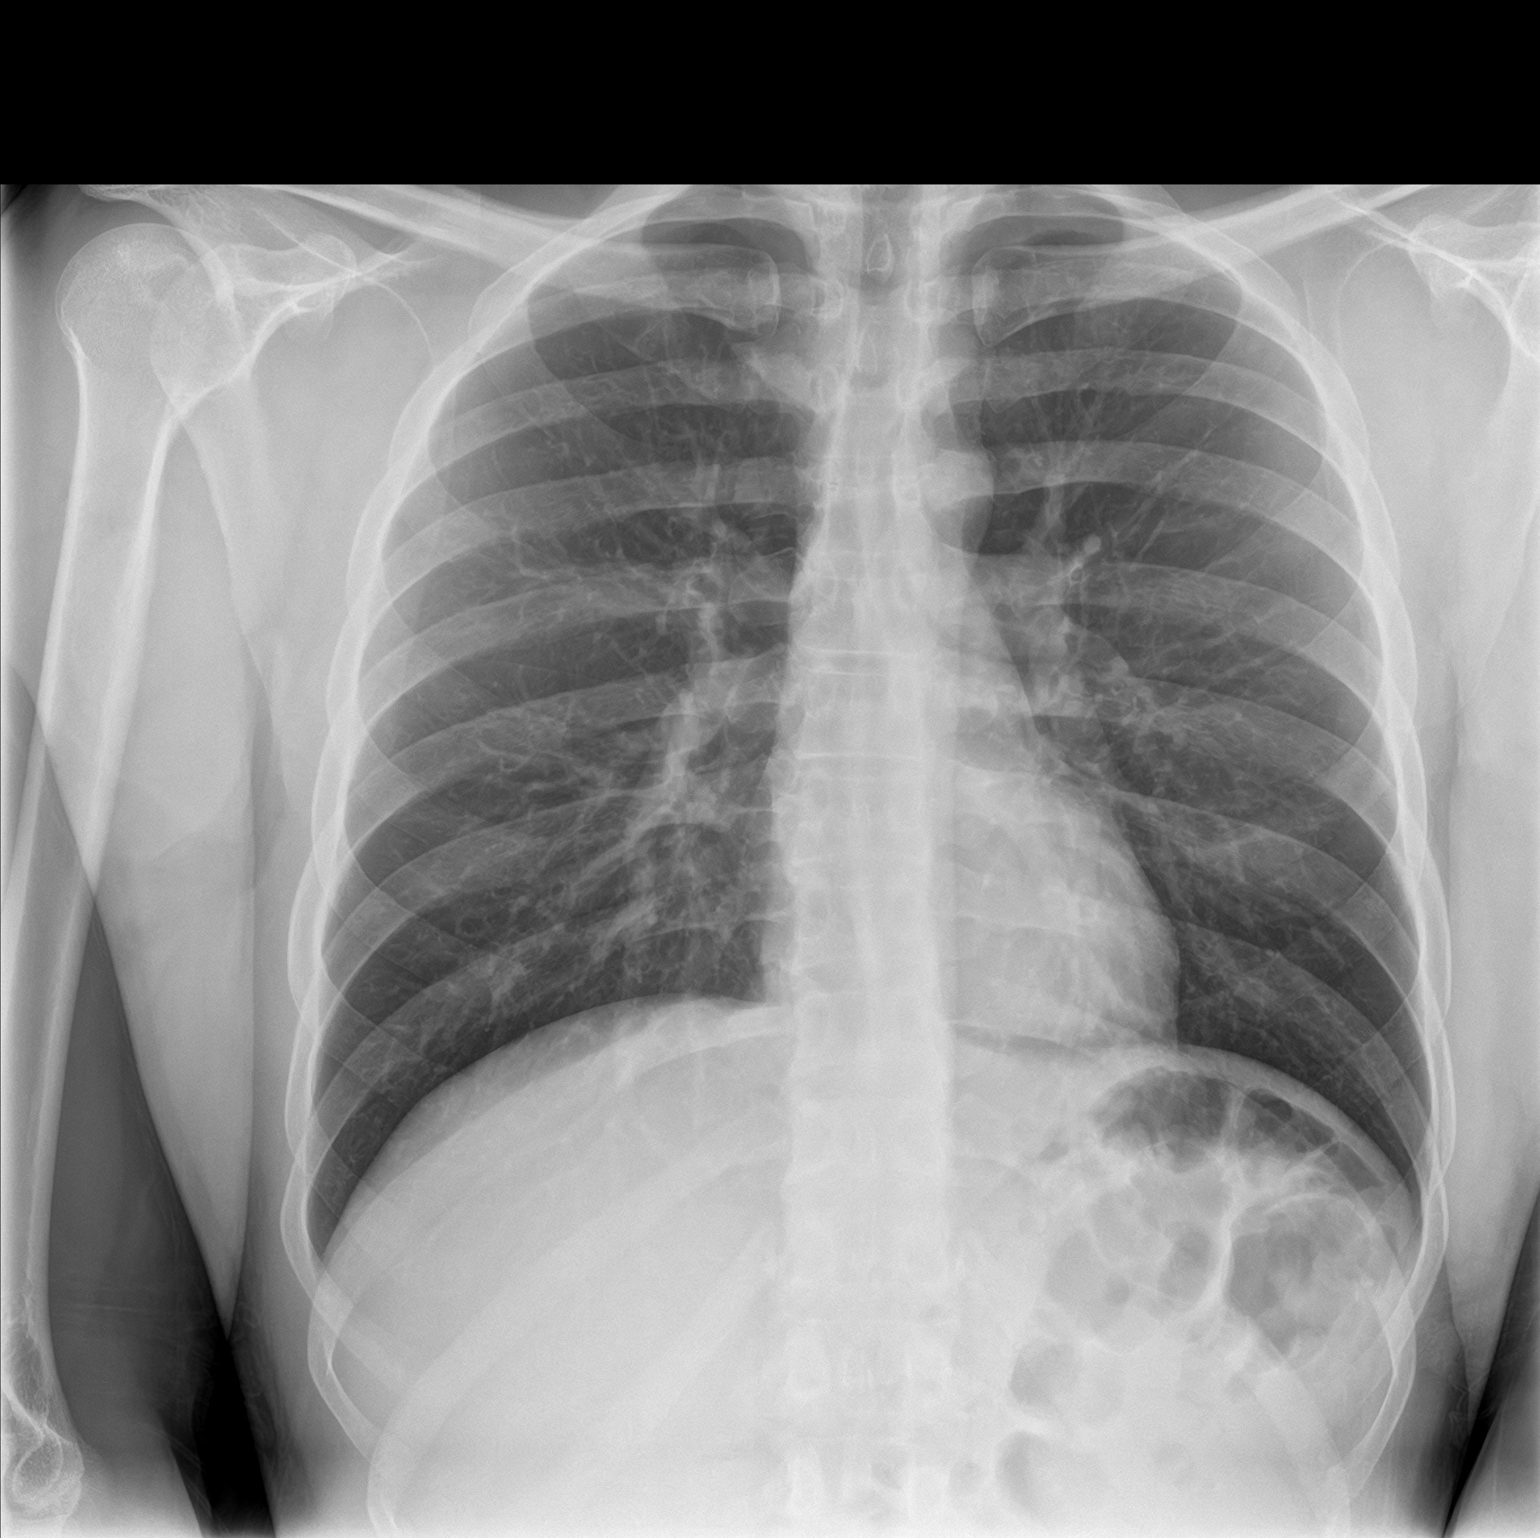

[chest lat]
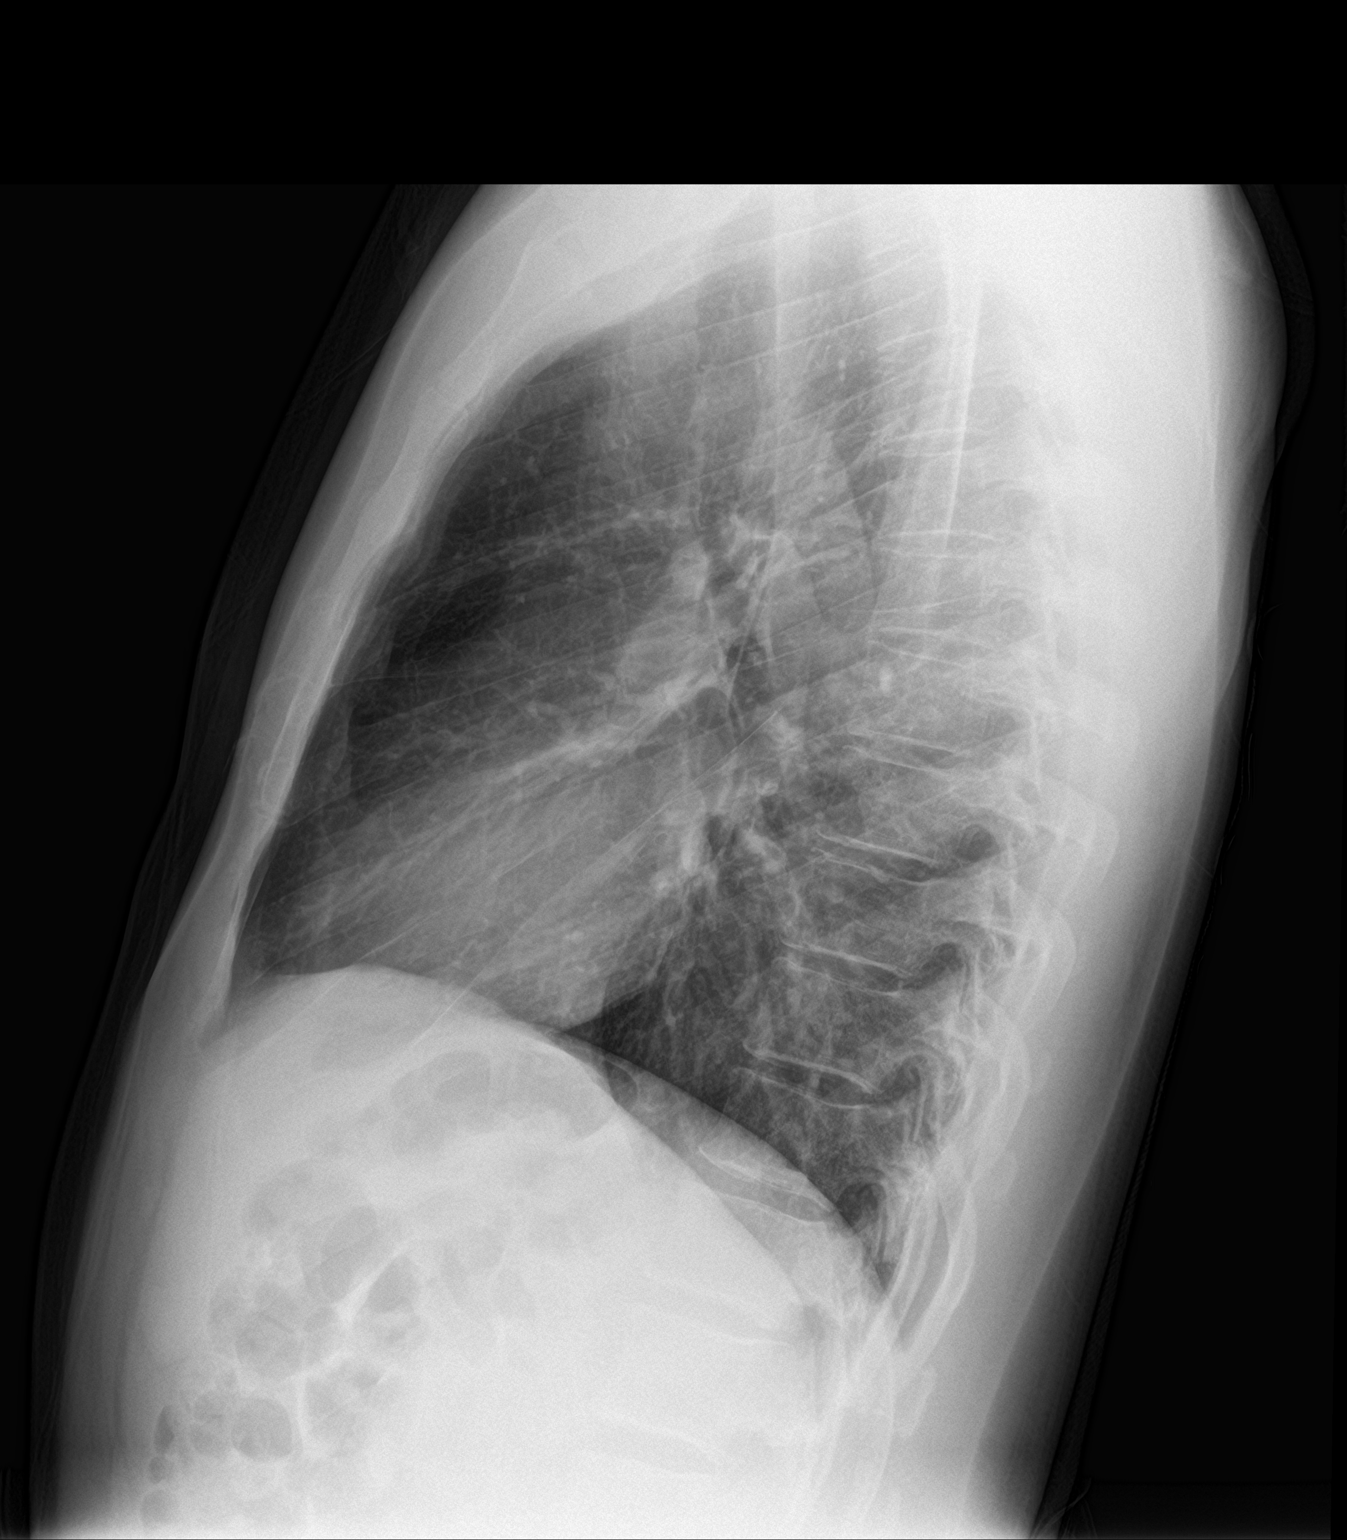

[2 of 2 positions shown; findings below may reference images not displayed]

FINDINGS: Cardiomediastinal silhouette is normal. No pleural effusions or
focal consolidations. Trachea projects midline and there is no
pneumothorax. Soft tissue planes and included osseous structures are
non-suspicious.
IMPRESSION: Negative.

## 2020-10-09 DIAGNOSIS — F419 Anxiety disorder, unspecified: Secondary | ICD-10-CM | POA: Insufficient documentation

## 2022-09-04 DIAGNOSIS — S60022A Contusion of left index finger without damage to nail, initial encounter: Secondary | ICD-10-CM | POA: Diagnosis not present

## 2023-05-04 DIAGNOSIS — F411 Generalized anxiety disorder: Secondary | ICD-10-CM | POA: Diagnosis not present

## 2023-05-04 DIAGNOSIS — F319 Bipolar disorder, unspecified: Secondary | ICD-10-CM | POA: Diagnosis not present

## 2023-05-04 DIAGNOSIS — F4322 Adjustment disorder with anxiety: Secondary | ICD-10-CM | POA: Diagnosis not present

## 2023-05-04 DIAGNOSIS — F419 Anxiety disorder, unspecified: Secondary | ICD-10-CM | POA: Diagnosis not present

## 2023-05-04 DIAGNOSIS — G4700 Insomnia, unspecified: Secondary | ICD-10-CM | POA: Diagnosis not present

## 2023-05-04 DIAGNOSIS — R630 Anorexia: Secondary | ICD-10-CM | POA: Diagnosis not present

## 2023-05-07 ENCOUNTER — Ambulatory Visit (HOSPITAL_COMMUNITY)
Admission: EM | Admit: 2023-05-07 | Discharge: 2023-05-07 | Disposition: A | Discharge status: Other Acute Inpatient Hospital | Attending: Psychiatry | Admitting: Psychiatry

## 2023-05-07 ENCOUNTER — Inpatient Hospital Stay
Admission: AD | Admit: 2023-05-07 | Discharge: 2023-05-13 | DRG: 885 | Disposition: A | Discharge status: Home / Self Care | Source: Intra-hospital | Attending: Psychiatry | Admitting: Psychiatry

## 2023-05-07 ENCOUNTER — Other Ambulatory Visit: Payer: Self-pay

## 2023-05-07 ENCOUNTER — Encounter: Payer: Self-pay | Admitting: Psychiatry

## 2023-05-07 ENCOUNTER — Ambulatory Visit: Payer: Self-pay | Admitting: Family Medicine

## 2023-05-07 DIAGNOSIS — F319 Bipolar disorder, unspecified: Secondary | ICD-10-CM | POA: Diagnosis not present

## 2023-05-07 DIAGNOSIS — F419 Anxiety disorder, unspecified: Secondary | ICD-10-CM | POA: Insufficient documentation

## 2023-05-07 DIAGNOSIS — F310 Bipolar disorder, current episode hypomanic: Secondary | ICD-10-CM

## 2023-05-07 DIAGNOSIS — R440 Auditory hallucinations: Secondary | ICD-10-CM | POA: Insufficient documentation

## 2023-05-07 DIAGNOSIS — F3111 Bipolar disorder, current episode manic without psychotic features, mild: Secondary | ICD-10-CM | POA: Diagnosis not present

## 2023-05-07 DIAGNOSIS — Z79899 Other long term (current) drug therapy: Secondary | ICD-10-CM | POA: Insufficient documentation

## 2023-05-07 DIAGNOSIS — Z8249 Family history of ischemic heart disease and other diseases of the circulatory system: Secondary | ICD-10-CM | POA: Diagnosis not present

## 2023-05-07 DIAGNOSIS — Z91030 Bee allergy status: Secondary | ICD-10-CM

## 2023-05-07 DIAGNOSIS — F23 Brief psychotic disorder: Secondary | ICD-10-CM | POA: Diagnosis not present

## 2023-05-07 DIAGNOSIS — F411 Generalized anxiety disorder: Secondary | ICD-10-CM | POA: Insufficient documentation

## 2023-05-07 DIAGNOSIS — G4700 Insomnia, unspecified: Secondary | ICD-10-CM | POA: Insufficient documentation

## 2023-05-07 LAB — COMPREHENSIVE METABOLIC PANEL
ALT: 13 U/L (ref 0–44)
AST: 19 U/L (ref 15–41)
Albumin: 4.3 g/dL (ref 3.5–5.0)
Alkaline Phosphatase: 62 U/L (ref 38–126)
Anion gap: 11 (ref 5–15)
BUN: <5 mg/dL — ABNORMAL LOW (ref 6–20)
CO2: 24 mmol/L (ref 22–32)
Calcium: 10 mg/dL (ref 8.9–10.3)
Chloride: 103 mmol/L (ref 98–111)
Creatinine, Ser: 1.32 mg/dL — ABNORMAL HIGH (ref 0.61–1.24)
GFR, Estimated: >60 mL/min (ref >60)
Glucose, Bld: 109 mg/dL — ABNORMAL HIGH (ref 70–99)
Potassium: 3.9 mmol/L (ref 3.5–5.1)
Sodium: 138 mmol/L (ref 135–145)
Total Bilirubin: 0.9 mg/dL (ref 0.0–1.2)
Total Protein: 7.5 g/dL (ref 6.5–8.1)

## 2023-05-07 LAB — POCT URINE DRUG SCREEN - MANUAL ENTRY (I-SCREEN)
POC Amphetamine UR: NOT DETECTED
POC Buprenorphine (BUP): NOT DETECTED
POC Cocaine UR: NOT DETECTED
POC Marijuana UR: NOT DETECTED
POC Methadone UR: NOT DETECTED
POC Methamphetamine UR: NOT DETECTED
POC Morphine: NOT DETECTED
POC Oxazepam (BZO): NOT DETECTED
POC Oxycodone UR: NOT DETECTED
POC Secobarbital (BAR): NOT DETECTED

## 2023-05-07 LAB — LIPID PANEL
Cholesterol: 154 mg/dL (ref 0–200)
HDL: 48 mg/dL (ref >40)
LDL Cholesterol: 98 mg/dL (ref 0–99)
Total CHOL/HDL Ratio: 3.2 ratio
Triglycerides: 39 mg/dL (ref <150)
VLDL: 8 mg/dL (ref 0–40)

## 2023-05-07 LAB — HEMOGLOBIN A1C
Hgb A1c MFr Bld: 5.1 % (ref 4.8–5.6)
Mean Plasma Glucose: 99.67 mg/dL

## 2023-05-07 LAB — CBC WITH DIFFERENTIAL/PLATELET
Abs Immature Granulocytes: 0.02 10*3/uL (ref 0.00–0.07)
Basophils Absolute: 0 10*3/uL (ref 0.0–0.1)
Basophils Relative: 0 %
Eosinophils Absolute: 0 10*3/uL (ref 0.0–0.5)
Eosinophils Relative: 0 %
HCT: 43.2 % (ref 39.0–52.0)
Hemoglobin: 14.5 g/dL (ref 13.0–17.0)
Immature Granulocytes: 0 %
Lymphocytes Relative: 10 %
Lymphs Abs: 0.7 10*3/uL (ref 0.7–4.0)
MCH: 27.5 pg (ref 26.0–34.0)
MCHC: 33.6 g/dL (ref 30.0–36.0)
MCV: 82 fL (ref 80.0–100.0)
Monocytes Absolute: 0.7 10*3/uL (ref 0.1–1.0)
Monocytes Relative: 10 %
Neutro Abs: 5.7 10*3/uL (ref 1.7–7.7)
Neutrophils Relative %: 80 %
Platelets: 289 10*3/uL (ref 150–400)
RBC: 5.27 MIL/uL (ref 4.22–5.81)
RDW: 11.9 % (ref 11.5–15.5)
WBC: 7.1 10*3/uL (ref 4.0–10.5)
nRBC: 0 % (ref 0.0–0.2)

## 2023-05-07 LAB — T4, FREE: Free T4: 0.88 ng/dL (ref 0.61–1.12)

## 2023-05-07 LAB — TSH: TSH: 1.87 u[IU]/mL (ref 0.350–4.500)

## 2023-05-07 LAB — ETHANOL: Alcohol, Ethyl (B): <10 mg/dL (ref <10)

## 2023-05-07 MED ORDER — DIPHENHYDRAMINE HCL 50 MG/ML IJ SOLN: 50.0000 mg | Freq: Three times a day (TID) | INTRAMUSCULAR | Status: DC | PRN

## 2023-05-07 MED ORDER — RISPERIDONE 1 MG PO TABS
0.5000 mg | ORAL_TABLET | Freq: Two times a day (BID) | ORAL | Status: DC
Start: 2023-05-07 — End: 2023-05-08
  Administered 2023-05-07 – 2023-05-08 (×2): 0.5 mg via ORAL
  Filled 2023-05-07 (×2): qty 1

## 2023-05-07 MED ORDER — TRAZODONE HCL 50 MG PO TABS
50.0000 mg | ORAL_TABLET | Freq: Every evening | ORAL | Status: DC | PRN
  Administered 2023-05-07 – 2023-05-12 (×5): 50 mg via ORAL
  Filled 2023-05-07 (×6): qty 1

## 2023-05-07 MED ORDER — HALOPERIDOL LACTATE 5 MG/ML IJ SOLN: 10.0000 mg | Freq: Three times a day (TID) | INTRAMUSCULAR | Status: DC | PRN

## 2023-05-07 MED ORDER — HALOPERIDOL 5 MG PO TABS: 5.0000 mg | ORAL_TABLET | Freq: Three times a day (TID) | ORAL | Status: DC | PRN

## 2023-05-07 MED ORDER — MAGNESIUM HYDROXIDE 400 MG/5ML PO SUSP: 30.0000 mL | Freq: Every day | ORAL | Status: DC | PRN

## 2023-05-07 MED ORDER — LORAZEPAM 2 MG/ML IJ SOLN: 2.0000 mg | Freq: Three times a day (TID) | INTRAMUSCULAR | Status: DC | PRN

## 2023-05-07 MED ORDER — TRAZODONE HCL 50 MG PO TABS: 50.0000 mg | ORAL_TABLET | Freq: Every evening | ORAL | Status: DC | PRN

## 2023-05-07 MED ORDER — DIPHENHYDRAMINE HCL 50 MG PO CAPS: 50.0000 mg | ORAL_CAPSULE | Freq: Three times a day (TID) | ORAL | Status: DC | PRN

## 2023-05-07 MED ORDER — HALOPERIDOL LACTATE 5 MG/ML IJ SOLN: 5.0000 mg | Freq: Three times a day (TID) | INTRAMUSCULAR | Status: DC | PRN

## 2023-05-07 MED ORDER — RISPERIDONE 0.5 MG PO TABS
0.5000 mg | ORAL_TABLET | Freq: Two times a day (BID) | ORAL | Status: DC
  Administered 2023-05-07: 0.5 mg via ORAL
  Filled 2023-05-07: qty 1

## 2023-05-07 MED ORDER — ACETAMINOPHEN 325 MG PO TABS
650.0000 mg | ORAL_TABLET | Freq: Four times a day (QID) | ORAL | Status: DC | PRN
  Administered 2023-05-08: 650 mg via ORAL
  Filled 2023-05-07: qty 2

## 2023-05-07 MED ORDER — HYDROXYZINE HCL 25 MG PO TABS
25.0000 mg | ORAL_TABLET | Freq: Three times a day (TID) | ORAL | Status: DC | PRN
  Administered 2023-05-07 – 2023-05-10 (×2): 25 mg via ORAL
  Filled 2023-05-07 (×2): qty 1

## 2023-05-07 MED ORDER — ALUM & MAG HYDROXIDE-SIMETH 200-200-20 MG/5ML PO SUSP: 30.0000 mL | ORAL | Status: DC | PRN

## 2023-05-07 MED ORDER — DIPHENHYDRAMINE HCL 25 MG PO CAPS: 50.0000 mg | ORAL_CAPSULE | Freq: Three times a day (TID) | ORAL | Status: DC | PRN

## 2023-05-07 MED ORDER — LORAZEPAM 1 MG PO TABS
1.0000 mg | ORAL_TABLET | Freq: Once | ORAL | Status: AC
  Administered 2023-05-07: 1 mg via ORAL
  Filled 2023-05-07: qty 1

## 2023-05-07 MED ORDER — ACETAMINOPHEN 325 MG PO TABS: 650.0000 mg | ORAL_TABLET | Freq: Four times a day (QID) | ORAL | Status: DC | PRN

## 2023-05-07 MED ORDER — HYDROXYZINE HCL 25 MG PO TABS
25.0000 mg | ORAL_TABLET | Freq: Three times a day (TID) | ORAL | Status: DC | PRN
  Administered 2023-05-07: 25 mg via ORAL
  Filled 2023-05-07: qty 1

## 2023-05-07 NOTE — Progress Notes (Signed)
   05/07/23 0929  [facility] Triage Screening (Walk-ins at [facility] only)  How Did You Hear About [place]? Family/Friend  What Is the Reason for Your Visit/Call Today? Louis Newton is a 38 year old male presenting to [facility] accompanied by his family. Pt reports he has been having severe anxiety and his heart wont stop racing. Pt also reports he cannot sleep because he is constantly in a "panic". Pt reports this has been going on since last week. Pt is currently taking hyroxine but is looking for something stronger for his anxiety. Pt is just looking to be on anxiety medication. Pt is also open to seeing an indivudal therapist. Pt denies substance use, Si Hi and Avh.  How Long Has This Been Causing You Problems? 1 wk - 1 month  Have You Recently Had Any Thoughts About Hurting Yourself? No  Are You Planning to Commit Suicide/Harm Yourself At This time? No  Have you Recently Had Thoughts About Hurting Someone [name]? No  Are You Planning To Harm Someone At This Time? No  Physical Abuse Denies  Verbal Abuse Denies  Sexual Abuse Denies  Exploitation of patient/patient's resources Denies  Self-Neglect Denies  Possible abuse reported to: Other (Comment)  Are you currently experiencing any auditory, visual or other hallucinations? No  Have You Used Any Alcohol or Drugs in the Past 24 Hours? No  Do you have any current medical co-morbidities that require immediate attention? No  Clinician description of patient physical appearance/behavior: cooperative, slightly anxious, anxious  What Do You Feel Would Help You the Most Today? Medication(s)  If access to [facility] was not available, would you have sought care in the Emergency Department? No  Determination of Need Routine (7 days)  Options For Referral Medication Management

## 2023-05-07 NOTE — Discharge Instructions (Signed)
 Pt transferred to [facility]

## 2023-05-07 NOTE — Tx Team (Signed)
 Initial Treatment Plan 05/07/2023 6:45 PM Louis Newton ZOX:[id number]    PATIENT STRESSORS: Medication change or noncompliance   Other: Insomnia     PATIENT STRENGTHS: Ability for insight  Arboriculturist fund of knowledge  Motivation for treatment/growth  Physical Health  Supportive family/friends  Work skills    PATIENT IDENTIFIED PROBLEMS: Manic episode, per patient  Insomnia  Anxiety/racing thoughts, per report  Medication noncompliance               DISCHARGE CRITERIA:  Ability to meet basic life and health needs Improved stabilization in mood, thinking, and/or behavior Medical problems require only outpatient monitoring Need for constant or close observation no longer present Reduction of life-threatening or endangering symptoms to within safe limits  PRELIMINARY DISCHARGE PLAN: Outpatient therapy Return to previous living arrangement Return to previous work or school arrangements  PATIENT/FAMILY INVOLVEMENT: This treatment plan has been presented to and reviewed with the patient, Louis Newton. The patient has been given the opportunity to ask questions and make suggestions.  Annica Marinello, RN 05/07/2023, 6:45 PM

## 2023-05-07 NOTE — BH Assessment (Addendum)
 Comprehensive Clinical Assessment (CCA) Note  05/07/2023 Louis Newton [id number]  Disposition: Per Gaylyn Rong, NP inpatient treatment is recommended.  [facility] to review.  Disposition SW to pursue appropriate inpatient options.   The patient demonstrates the following risk factors for suicide: Chronic risk factors for suicide include: psychiatric disorder of Bipolar, untreated . Acute risk factors for suicide include: family or marital conflict and loss (financial, interpersonal, professional). Protective factors for this patient include: positive social support, responsibility to others (children, family), and hope for the future. Considering these factors, the overall suicide risk at this point appears to be low. Patient is appropriate for outpatient follow up.   Patient is a 38 year old male with a history of Bipolar Disorder, untreated and anxiety who presents voluntarily to [facility] for assessment.  Patient presents accompanied by his brother and mother.  He preferred that his brother stay for the assessment.  Patient reports he has been having severe anxiety and his heart "won't stop racing."  He also describes symptoms of not being able to sleep well recently, and he did not sleep at all last night.  He also reports his mind "races" nonstop and he cannot "slow" his mind or body.  Patient shares this episode has been going on now for almost a week.  He is currently Rx Hydroxyzine 50mg , however he doesn't feel this is helping control the symptoms.  Patient reports the primary stressor has been issues with his ex, who is also his 54 y.o. son's mom.  Recently, patient's son mentioned that his mother moved another man into her home.  Patient is struggling to manage his emotions related to this.  Patient has been diagnosed with Bipolar in the past and he has had similar episodes twice, each time requiring inpatient treatment.  His last inpatient admission was to [facility].  Patient  has not followed up with psychiatry. Patient denies SI, HI, AVH or SA hx.  He is open to treatment recommendations.  Inpatient treatment has been recommended and patient is voluntary for admission.      Chief Complaint:     Chief Complaint  Patient presents with   Anxiety   Medication Problem    Visit Diagnosis: Bipolar I Disorder, untreated                             Anxiety Disorder Unspecified      CCA Screening, Triage and Referral (STR)  Patient Reported Information How did you hear about [place]? Family/Friend  What Is the Reason for Your Visit/Call Today? Louis Newton is a 38 year old male presenting to [facility] accompanied by his family. Pt reports he has been having severe anxiety and his heart wont stop racing. Pt also reports he cannot sleep because he is constantly in a "panic". Pt reports this has been going on since last week. Pt is currently taking hyroxine but is looking for something stronger for his anxiety. Pt is just looking to be on anxiety medication. Pt is also open to seeing an indivudal therapist. Pt denies substance use, Si Hi and Avh.  How Long Has This Been Causing You Problems? 1 wk - 1 month  What Do You Feel Would Help You the Most Today? Medication(s)   Have You Recently Had Any Thoughts About Hurting Yourself? No  Are You Planning to Commit Suicide/Harm Yourself At This time? No   Flowsheet Row ED from 05/07/2023 in [facility]  C-SSRS RISK CATEGORY No Risk       Have you Recently Had Thoughts About Hurting Someone [name]? No  Are You Planning to Harm Someone at This Time? No  Explanation: N/A  Have You Used Any Alcohol or Drugs in the Past 24 Hours? No  How Long Ago Did You Use Drugs or Alcohol? N/A What Did You Use and How Much? N/A  Do You Currently Have a Therapist/Psychiatrist? No  Name of Therapist/Psychiatrist:    Have You Been Recently Discharged From Any Office Practice or Programs? No  Explanation of  Discharge From Practice/Program: N/A    CCA Screening Triage Referral Assessment Type of Contact: Face-to-Face  Telemedicine Service Delivery:   Is this Initial or Reassessment?   Date Telepsych consult ordered in [facility]:    Time Telepsych consult ordered in [facility]:    Location of Assessment: [facility] Services  Provider Location: [facility] Assessment Services   Collateral Involvement: Brother provided some collateral.   Does Patient Have a Automotive engineer Guardian? No  Legal Guardian Contact Information: N/A  Copy of Legal Guardianship Form: -- (N/A)  Legal Guardian Notified of Arrival: -- (N/A)  Legal Guardian Notified of Pending Discharge: -- (N/A)  If Minor and Not Living with Parent(s), Who has Custody? N/A  Is CPS involved or ever been involved? Never  Is APS involved or ever been involved? Never   Patient Determined To Be At Risk for Harm To Self or Others Based on Review of Patient Reported Information or Presenting Complaint? No  Method: -- (N/A, no HI)  Availability of Means: -- (N/A, no HI)  Intent: -- (N/A, no HI)  Notification Required: -- (N/A, no HI)  Additional Information for Danger to Others Potential: -- (N/A, no HI)  Additional Comments for Danger to Others Potential: N/A, no HI  Are There Guns or Other Weapons in Your Home? No  Types of Guns/Weapons: N/A  Are These Weapons Safely Secured?                            -- (N/A)  Who Could Verify You Are Able To Have These Secured: N/A  Do You Have any Outstanding Charges, Pending Court Dates, Parole/Probation? None  Contacted To Inform of Risk of Harm To Self or Others: Family/Significant Other:    Does Patient Present under Involuntary Commitment? No    [place] of Residence: [place]   Patient Currently Receiving the Following Services: None  Determination of Need: Urgent (48 hours)   Options For Referral: Medication Management; Inpatient Hospitalization; Outpatient  Therapy     CCA Biopsychosocial Patient Reported Schizophrenia/Schizoaffective Diagnosis in Past: No   Strengths: Patient is seeking treatment, he has support   Mental Health Symptoms Depression:  Change in energy/activity; Difficulty Concentrating   Duration of Depressive symptoms: Duration of Depressive Symptoms: Greater than two weeks   Mania:  Racing thoughts; Increased Energy   Anxiety:   Tension; Worrying; Sleep   Psychosis:  None   Duration of Psychotic symptoms: Duration of Psychotic Symptoms: Less than six months   Trauma:  None   Obsessions:  None   Compulsions:  None   Inattention:  N/A   Hyperactivity/Impulsivity:  N/A   Oppositional/Defiant Behaviors:  N/A   Emotional Irregularity:  Mood lability   Other Mood/Personality Symptoms:  NA    Mental Status Exam Appearance and self-care  Stature:  Average   Weight:  Average  weight   Clothing:  Casual   Grooming:  Normal   Cosmetic use:  None   Posture/gait:  Normal   Motor activity:  Restless   Sensorium  Attention:  Normal   Concentration:  Anxiety interferes; Variable   Orientation:  X5   Recall/memory:  Normal   Affect and Mood  Affect:  Anxious; Congruent; Constricted   Mood:  Anxious; Hypomania   Relating  Eye contact:  Fleeting   Facial expression:  Anxious; Responsive   Attitude toward examiner:  Cooperative   Thought and Language  Speech flow: Clear and Coherent   Thought content:  Appropriate to Mood and Circumstances   Preoccupation:  None   Hallucinations:  None   Organization:  Intact   Company secretary of Knowledge:  Average   Intelligence:  Average   Abstraction:  Functional   Judgement:  Fair   Dance movement psychotherapist:  Adequate   Insight:  Fair   Decision Making:  Normal   Social Functioning  Social Maturity:  Responsible   Social Judgement:  Normal   Stress  Stressors:  Family conflict; Relationship   Coping Ability:   Human resources officer Deficits:  Interpersonal   Supports:  Family; Friends/Service system     Religion: Religion/Spirituality Are You A Religious Person?: No How Might This Affect Treatment?: N/A  Leisure/Recreation: Leisure / Recreation Do You Have Hobbies?: No  Exercise/Diet: Exercise/Diet Do You Exercise?: No Have You Gained or Lost A Significant Amount of Weight in the Past Six Months?: No Do You Follow a Special Diet?: No Do You Have Any Trouble Sleeping?: Yes Explanation of Sleeping Difficulties: hasn't slept for 24 hours - past week poor sleep   CCA Employment/Education Employment/Work Situation: Employment / Work Situation Employment Situation: Employed Work Stressors: None reported Patient's Job has Been Impacted by Current Illness: No Has Patient ever Been in Equities trader?: No  Education: Education Is Patient Currently Attending School?: No Last Grade Completed: 12 Did You Product manager?: No Did You Have An Individualized Education Program (IIEP): No Did You Have Any Difficulty At [employer]?: No Patient's Education Has Been Impacted by Current Illness: No   CCA Family/Childhood History Family and Relationship History: Family history Marital status: Single Does patient have children?: Yes How many children?: 1 How is patient's relationship with their children?: No concerns noted  Childhood History:  Childhood History By whom was/is the patient raised?: Mother, Grandparents Did patient suffer any verbal/emotional/physical/sexual abuse as a child?: No Did patient suffer from severe childhood neglect?: No Has patient ever been sexually abused/assaulted/raped as an adolescent or adult?: No Was the patient ever a victim of a crime or a disaster?: No Witnessed domestic violence?: No Has patient been affected by domestic violence as an adult?: No       CCA Substance Use Alcohol/Drug Use: Alcohol / Drug Use Pain Medications: See MAR Prescriptions:  See MAR Over the Counter: See MAR History of alcohol / drug use?: No history of alcohol / drug abuse                         ASAM's:  Six Dimensions of Multidimensional Assessment  Dimension 1:  Acute Intoxication and/or Withdrawal Potential:      Dimension 2:  Biomedical Conditions and Complications:      Dimension 3:  Emotional, Behavioral, or Cognitive Conditions and Complications:     Dimension 4:  Readiness to Change:     Dimension 5:  Relapse, Continued use, or Continued Problem Potential:     Dimension 6:  Recovery/Living Environment:     ASAM Severity Score:    ASAM Recommended Level of Treatment:     Substance use Disorder (SUD)    Recommendations for Services/Supports/Treatments:    Disposition Recommendation per psychiatric provider: We recommend inpatient psychiatric hospitalization when medically cleared. Patient is under voluntary admission status at this time; please IVC if attempts to leave hospital.   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

## 2023-05-07 NOTE — Group Note (Signed)
 Date:  05/07/2023 Time:  9:45 PM  Group Topic/Focus:  Making Healthy Choices:   The focus of this group is to help patients identify negative/unhealthy choices they were using prior to admission and identify positive/healthier coping strategies to replace them upon discharge. Managing Feelings:   The focus of this group is to identify what feelings patients have difficulty handling and develop a plan to handle them in a healthier way upon discharge. Wrap-Up Group:   The focus of this group is to help patients review their daily goal of treatment and discuss progress on daily workbooks.    Participation Level:  Active  Participation Quality:  Appropriate  Affect:  Appropriate  Cognitive:  Appropriate  Insight: Appropriate  Engagement in Group:  Engaged  Modes of Intervention:  Discussion and Support  Additional Comments:  n/a  Lorenda Ishihara 05/07/2023, 9:45 PM

## 2023-05-07 NOTE — ED Notes (Signed)
 Patient is transferring to [facility] at this time via safe transport. Voluntary consent, EMTALA, and other transfer paperwork sent with patient and provided to transport. Patient denies SI,HI, and A/V/H. Calm and cooperative at time of transfer.Report provided to Talbert Forest, Charity fundraiser at [facility]. All valuables/belongings sent with patient. Patient in no current distress.

## 2023-05-07 NOTE — ED Notes (Signed)
 Patient given scheduled medication along with hydroxyzine po prn due to anxiety. Patient then went to sit down and rest. Patient ate lunch. No s/s of current distress.

## 2023-05-07 NOTE — ED Provider Notes (Signed)
 Behavioral Health Urgent Care Medical Screening Exam  Patient Name: Louis Newton MRN: [id number] Date of Evaluation: 05/07/23 Chief Complaint:  "I can't sleep and I feel panicked all of the time".  Diagnosis:  Final diagnoses:  Bipolar affective disorder, current episode hypomanic (HCC)    History of Present illness:  Louis Newton 38 y.o., male patient presented to [facility] as a voluntary walk in accompanied by his brother with complaints of insomnia, restlessness, pacing, auditory hallucinations and anxiety. Louis Newton, is seen face to face by this provider, consulted with Dr. Enedina Finner and chart reviewed on 05/07/23.  Upon chart review pt has PPH of Bipolar disorder, Anxiety and Brief psychotic disorder. He was hospitalized in 2019 and discharged on Risperidone 0.5mg  BID. Also hospitalized under IVC in 2017 and discharged on Seroquel XR 300mg . Pt reports that he has not followed up with outpatient psychiatry after hospitalizations. He has had multiple ED visits for insomnia and anxiety. Recently prescribed Hydroxyzine 50mg  PRN but reports that it was completely ineffective.    On evaluation Louis Newton reports "I haven't been able to sleep and I feel panicked and anxious all of the time. Last week my 1 year old son told me that his mom moved another guy into the house and it feels like she is trying to make that guy his dad. At night, I am up and down, pacing around the house. I hear sounds, not voices, that aren't really there and they make me feel paranoid like someone is trying to get me or something. I took the Hydroxyzine last night and it didn't even touch me. I was still up all throughout the night." Pt reports that he currently lives with brother and mother. He is working full time but reports "its like I forgot how to do my job, I couldn't think". He reports having racing thoughts and mood instability.    Brother reports that last night he had to sleep on the couch due to the  concerning behaviors of pt. Brother states "he might have slept 30 minutes last night because he was up pacing around the house and he gets agitated very easily where he just has these episodes. His mood can be all over the place. One minute he is fine the next he is agitated then crying the next minute. My mom gets concerned and calls me from work when he has these episodes because she is too old to be dealing with that. He has never been physically aggressive though".    During evaluation Louis Newton is sitting in assessment room, in no acute distress. He is alert & oriented x 4, cooperative but distracted at times for this assessment.  His mood is anxious, labile with congruent affect.  He has normal speech but displays some thought blocking at times during assessment. His behavior is restless and unable to sit still.  Objectively there is evidence of psychosis and mania such as auditory hallucinations, mood lability, thought blocking, restlessness, and paranoia.  He seems internally preoccupied at times.    He denies visual hallucinations, suicidal and homicidal ideations. Endorses auditory hallucinations of "sounds", not command in nature along with paranoid ideations. Patient is able to answer assessment questions appropriately.    Flowsheet Row ED from 05/07/2023 in [facility]  C-SSRS RISK CATEGORY No Risk       Past Medical History: Pediatric heart murmur    Family History: Grandmother- Bipolar disorder    Social History:  Pt currently working full time, living with mother and brother, has a 48 yr old son but strained relationship with child's mother. He denies substance use Psychiatric Specialty Exam  Presentation  General Appearance:Appropriate for Environment  Eye Contact:Fleeting  Speech:Blocked  Speech Volume:Normal  Handedness:No data recorded  Mood and Affect  Mood: Anxious; Labile; Dysphoric  Affect: Congruent   Thought Process   Thought Processes: Coherent  Descriptions of Associations:Circumstantial  Orientation:Full (Time, Place and Person)  Thought Content:Paranoid Ideation  Diagnosis of Schizophrenia or Schizoaffective disorder in past: No  Duration of Psychotic Symptoms: Less than six months  Hallucinations:Auditory  Ideas of Reference:Paranoia  Suicidal Thoughts:No  Homicidal Thoughts:No   Sensorium  Memory: Immediate Fair; Recent Fair  Judgment: Fair  Insight: Good   Executive Functions  Concentration: Fair  Attention Span: Fair  Recall: Fair  Fund of Knowledge: Fair  Language: Good   Psychomotor Activity  Psychomotor Activity: Restlessness   Assets  Assets: Communication Skills; Desire for Improvement; Financial Resources/Insurance; Housing; Physical Health; Resilience; Social Support; Vocational/Educational   Sleep  Sleep: Poor  Number of hours:  1   Physical Exam Vitals and nursing note reviewed.  HENT:     Head: Normocephalic.     Nose: Nose normal.  Eyes:     Extraocular Movements: Extraocular movements intact.  Cardiovascular:     Rate and Rhythm: Normal rate.  Pulmonary:     Effort: Pulmonary effort is normal.  Musculoskeletal:        General: Normal range of motion.     Cervical back: Normal range of motion.  Neurological:     General: No focal deficit present.     Mental Status: He is alert and oriented to person, place, and time.      Review of Systems  Constitutional: Negative.   HENT: Negative.    Eyes: Negative.   Respiratory: Negative.    Cardiovascular: Negative.   Gastrointestinal: Negative.   Genitourinary: Negative.   Musculoskeletal: Negative.   Neurological: Negative.   Endo/Heme/Allergies: Negative.   Psychiatric/Behavioral:  Positive for depression and hallucinations. The patient is nervous/anxious and has insomnia.     Blood pressure (!) 150/79, pulse 92, resp. rate 19, SpO2 100%. There is no height or weight on  file to calculate BMI.  Musculoskeletal: Strength & Muscle Tone: within normal limits Gait & Station: normal Patient leans: N/A   [facility] MSE Discharge Disposition for Follow up and Recommendations: Based on my evaluation I certify that psychiatric inpatient services furnished can reasonably be expected to improve the patient's condition which I recommend transfer to an appropriate accepting facility.  Pt is recommended for inpatient hospitalization for mood stabilization and psychotic features. He is voluntary and agreeable to this treatment plan. Pt accepted and transferred to [facility].    Treatment Plan:  -Inpatient hospitalization -Medications:  -Pt agreeable to restart Risperidone 0.5mg  BID, as he was prescribed at prior hospitalization and responded well to.  -One time order for Ativan 1 mg for increased panic, restlessness, and mild agitation.   Hydroxyzine ineffective.  -Labs:             -Ordered all admission labs listed below -Also ordered Free T4 due to history of elevated TSH and T4 levels, possibly indicating thyroid issues which could be exacerbating symptoms.   Meds ordered this encounter  Medications   acetaminophen (TYLENOL) tablet 650 mg   alum & mag hydroxide-simeth (MAALOX/MYLANTA) 200-200-20 MG/5ML suspension 30 mL   magnesium hydroxide (MILK OF MAGNESIA) suspension 30  mL   AND Linked Order Group    haloperidol (HALDOL) tablet 5 mg    diphenhydrAMINE (BENADRYL) capsule 50 mg   AND Linked Order Group    haloperidol lactate (HALDOL) injection 5 mg    diphenhydrAMINE (BENADRYL) injection 50 mg    LORazepam (ATIVAN) injection 2 mg   AND Linked Order Group    haloperidol lactate (HALDOL) injection 10 mg    diphenhydrAMINE (BENADRYL) injection 50 mg    LORazepam (ATIVAN) injection 2 mg   hydrOXYzine (ATARAX) tablet 25 mg   traZODone (DESYREL) tablet 50 mg   risperiDONE (RISPERDAL) tablet 0.5 mg   LORazepam (ATIVAN) tablet 1 mg    Lab Orders         CBC  with Differential/Platelet         Comprehensive metabolic panel         Hemoglobin A1c         Ethanol         Lipid panel         TSH         T4, free         POCT Urine Drug Screen - (I-Screen)      Louis Ill, NP 05/07/2023, 1:53 PM

## 2023-05-07 NOTE — ED Notes (Signed)
 Patient admitted into obs unit. Patient presents anxious but cooperative. Patient denies SI,HI, and A/V/H with no plan or intent. Patient states he just needs help managing his anxiety. Patient oriented to unit and aware of process. Patient provided with lunch. No s/s of current distress.

## 2023-05-07 NOTE — Progress Notes (Signed)
   05/07/23 1800  Psych Admission Type (Psych Patients Only)  Admission Status Voluntary  Psychosocial Assessment  Patient Complaints Insomnia;Sleep disturbance;Other (Comment) (per patient, manic episode)  Eye Contact Fair  Facial Expression Other (Comment) (appropriate)  Affect Appropriate to circumstance  Speech Logical/coherent  Interaction Assertive  Motor Activity Slow  Appearance/Hygiene Unremarkable  Behavior Characteristics Cooperative;Appropriate to situation  Mood Pleasant (patient's goal for treatment is "just to get better and actually stay on medication, because I never do that".)  Aggressive Behavior  Effect No apparent injury  Thought Process  Coherency WDL  Content WDL  Delusions None reported or observed  Perception WDL  Hallucination None reported or observed  Judgment WDL  Confusion None  Danger to Self  Current suicidal ideation? Denies  Danger to Others  Danger to Others None reported or observed

## 2023-05-07 NOTE — BH Assessment (Deleted)
 Comprehensive Clinical Assessment (CCA) Note  05/07/2023 Louis Newton [id number]   Disposition: Per Gaylyn Rong, NP inpatient treatment is recommended.  [facility] to review.  Disposition SW to pursue appropriate inpatient options.  The patient demonstrates the following risk factors for suicide: Chronic risk factors for suicide include: psychiatric disorder of Bipolar, untreated . Acute risk factors for suicide include: family or marital conflict and loss (financial, interpersonal, professional). Protective factors for this patient include: positive social support, responsibility to others (children, family), and hope for the future. Considering these factors, the overall suicide risk at this point appears to be low. Patient is appropriate for outpatient follow up.  Patient is a 38 year old male with a history of Bipolar Disorder, untreated and anxiety who presents voluntarily to [facility] for assessment.  Patient presents accompanied by his brother and mother.  He preferred that his brother stay for the assessment.  Patient reports he has been having severe anxiety and his heart "won't stop racing."  He also describes symptoms of not being able to sleep well recently, and he did not sleep at all last night.  He also reports his mind "races" nonstop and he cannot "slow" his mind or body.  Patient shares this episode has been going on now for almost a week.  He is currently Rx Hydroxyzine 50mg , however he doesn't feel this is helping control the symptoms.  Patient reports the primary stressor has been issues with his ex, who is also his 22 y.o. son's mom.  Recently, patient's son mentioned that his mother moved another man into her home.  Patient is struggling to manage his emotions related to this.  Patient has been diagnosed with Bipolar in the past and he has had similar episodes twice, each time requiring inpatient treatment.  His last inpatient admission was to [facility].  Patient  has not followed up with psychiatry. Patient denies SI, HI, AVH or SA hx.  He is open to treatment recommendations.  Inpatient treatment has been recommended and patient is voluntary for admission.    Chief Complaint:  Chief Complaint  Patient presents with   Anxiety   Medication Problem   Visit Diagnosis: Bipolar I Disorder, untreated                             Anxiety Disorder Unspecified    CCA Screening, Triage and Referral (STR)  Patient Reported Information How did you hear about [place]? Family/Friend  What Is the Reason for Your Visit/Call Today? [name] is a 38 year old male presenting to [facility] accompanied by his family. Pt reports he has been having severe anxiety and his heart wont stop racing. Pt also reports he cannot sleep because he is constantly in a "panic". Pt reports this has been going on since last week. Pt is currently taking hyroxine but is looking for something stronger for his anxiety. Pt is just looking to be on anxiety medication. Pt is also open to seeing an indivudal therapist. Pt denies substance use, Si Hi and Avh.  How Long Has This Been Causing You Problems? 1 wk - 1 month  What Do You Feel Would Help You the Most Today? Medication(s)   Have You Recently Had Any Thoughts About Hurting Yourself? No  Are You Planning to Commit Suicide/Harm Yourself At This time? No   Flowsheet Row ED from 05/07/2023 in [facility]  C-SSRS RISK CATEGORY No Risk  Have you Recently Had Thoughts About Hurting Someone [name]? No  Are You Planning to Harm Someone at This Time? No  Explanation: No data recorded  Have You Used Any Alcohol or Drugs in the Past 24 Hours? No  How Long Ago Did You Use Drugs or Alcohol? No data recorded What Did You Use and How Much? No data recorded  Do You Currently Have a Therapist/Psychiatrist? No  Name of Therapist/Psychiatrist:    Have You Been Recently Discharged From Any Office Practice or  Programs? No  Explanation of Discharge From Practice/Program: No data recorded    CCA Screening Triage Referral Assessment Type of Contact: Face-to-Face  Telemedicine Service Delivery:   Is this Initial or Reassessment?   Date Telepsych consult ordered in [facility]:    Time Telepsych consult ordered in [facility]:    Location of Assessment[facility] Services  Provider Location: [facility] Assessment Services   Collateral Involvement: Brother provided some collateral.   Does Patient Have a Automotive engineer Guardian? No  Legal Guardian Contact Information: N/A  Copy of Legal Guardianship Form: -- (N/A)  Legal Guardian Notified of Arrival: -- (N/A)  Legal Guardian Notified of Pending Discharge: -- (N/A)  If Minor and Not Living with Parent(s), Who has Custody? N/A  Is CPS involved or ever been involved? Never  Is APS involved or ever been involved? Never   Patient Determined To Be At Risk for Harm To Self or Others Based on Review of Patient Reported Information or Presenting Complaint? No  Method: -- (N/A, no HI)  Availability of Means: -- (N/A, no HI)  Intent: -- (N/A, no HI)  Notification Required: -- (N/A, no HI)  Additional Information for Danger to Others Potential: -- (N/A, no HI)  Additional Comments for Danger to Others Potential: N/A, no HI  Are There Guns or Other Weapons in Your Home? No  Types of Guns/Weapons: N/A  Are These Weapons Safely Secured?                            -- (N/A)  Who Could Verify You Are Able To Have These Secured: N/A  Do You Have any Outstanding Charges, Pending Court Dates, Parole/Probation? None  Contacted To Inform of Risk of Harm To Self or Others: Family/Significant Other:    Does Patient Present under Involuntary Commitment? No    [place] of Residence: [place]   Patient Currently Receiving the Following Services: No data recorded  Determination of Need: Urgent (48 hours)   Options For Referral:  Medication Management; Inpatient Hospitalization; Outpatient Therapy     CCA Biopsychosocial Patient Reported Schizophrenia/Schizoaffective Diagnosis in Past: No   Strengths: No data recorded  Mental Health Symptoms Depression:  No data recorded  Duration of Depressive symptoms:    Mania:  No data recorded  Anxiety:   No data recorded  Psychosis:  No data recorded  Duration of Psychotic symptoms: Duration of Psychotic Symptoms: Greater than six months   Trauma:  No data recorded  Obsessions:  No data recorded  Compulsions:  No data recorded  Inattention:  No data recorded  Hyperactivity/Impulsivity:  No data recorded  Oppositional/Defiant Behaviors:  No data recorded  Emotional Irregularity:  No data recorded  Other Mood/Personality Symptoms:  No data recorded   Mental Status Exam Appearance and self-care  Stature:  No data recorded  Weight:  No data recorded  Clothing:  No data recorded  Grooming:  No data recorded  Cosmetic use:  No data recorded  Posture/gait:  No data recorded  Motor activity:  No data recorded  Sensorium  Attention:  No data recorded  Concentration:  No data recorded  Orientation:  No data recorded  Recall/memory:  No data recorded  Affect and Mood  Affect:  No data recorded  Mood:  No data recorded  Relating  Eye contact:  No data recorded  Facial expression:  No data recorded  Attitude toward examiner:  No data recorded  Thought and Language  Speech flow: No data recorded  Thought content:  No data recorded  Preoccupation:  No data recorded  Hallucinations:  No data recorded  Organization:  No data recorded  [employer] of Knowledge:  No data recorded  Intelligence:  No data recorded  Abstraction:  No data recorded  Judgement:  No data recorded  Reality Testing:  No data recorded  Insight:  No data recorded  Decision Making:  No data recorded  Social Functioning  Social Maturity:  No data recorded  Social  Judgement:  No data recorded  Stress  Stressors:  No data recorded  Coping Ability:  No data recorded  Skill Deficits:  No data recorded  Supports:  No data recorded    Religion:    Leisure/Recreation:    Exercise/Diet:     CCA Employment/Education Employment/Work Situation: Employment / Work Situation Employment Situation: Employed Work Stressors: None reported Patient's Job has Been Impacted by Current Illness: No Has Patient ever Been in Equities trader?: No  Education: Education Is Patient Currently Attending School?: No Last Grade Completed: 12 Did You Product manager?: No Did You Have An Individualized Education Program (IIEP): No Did You Have Any Difficulty At [employer]?: No Patient's Education Has Been Impacted by Current Illness: No   CCA Family/Childhood History Family and Relationship History: Family history Marital status: Single Does patient have children?: Yes How many children?: 1 How is patient's relationship with their children?: No concerns noted  Childhood History:  Childhood History By whom was/is the patient raised?: Mother, Grandparents Did patient suffer any verbal/emotional/physical/sexual abuse as a child?: No Did patient suffer from severe childhood neglect?: No Has patient ever been sexually abused/assaulted/raped as an adolescent or adult?: No Was the patient ever a victim of a crime or a disaster?: No Witnessed domestic violence?: No Has patient been affected by domestic violence as an adult?: No       CCA Substance Use Alcohol/Drug Use: Alcohol / Drug Use Pain Medications: See MAR Prescriptions: See MAR Over the Counter: See MAR History of alcohol / drug use?: No history of alcohol / drug abuse                         ASAM's:  Six Dimensions of Multidimensional Assessment  Dimension 1:  Acute Intoxication and/or Withdrawal Potential:      Dimension 2:  Biomedical Conditions and Complications:      Dimension 3:   Emotional, Behavioral, or Cognitive Conditions and Complications:     Dimension 4:  Readiness to Change:     Dimension 5:  Relapse, Continued use, or Continued Problem Potential:     Dimension 6:  Recovery/Living Environment:     ASAM Severity Score:    ASAM Recommended Level of Treatment:     Substance use Disorder (SUD)    Recommendations for Services/Supports/Treatments:    Disposition Recommendation per psychiatric provider: We recommend inpatient psychiatric hospitalization when medically cleared. Patient is  under voluntary admission status at this time; please IVC if attempts to leave hospital.   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

## 2023-05-07 NOTE — Plan of Care (Signed)
 New admission.  Problem: Education: Goal: Knowledge of [place] [facility] Education information/materials will improve Outcome: Not Progressing Goal: Emotional status will improve Outcome: Not Progressing Goal: Mental status will improve Outcome: Not Progressing Goal: Verbalization of understanding the information provided will improve Outcome: Not Progressing   Problem: Activity: Goal: Interest or engagement in activities will improve Outcome: Not Progressing Goal: Sleeping patterns will improve Outcome: Not Progressing   Problem: Coping: Goal: Ability to verbalize frustrations and anger appropriately will improve Outcome: Not Progressing Goal: Ability to demonstrate self-control will improve Outcome: Not Progressing   Problem: Health Behavior/Discharge Planning: Goal: Identification of resources available to assist in meeting health care needs will improve Outcome: Not Progressing Goal: Compliance with treatment plan for underlying cause of condition will improve Outcome: Not Progressing   Problem: Physical Regulation: Goal: Ability to maintain clinical measurements within normal limits will improve Outcome: Not Progressing   Problem: Safety: Goal: Periods of time without injury will increase Outcome: Not Progressing   Problem: Education: Goal: Knowledge of [place] [facility] Education information/materials will improve Outcome: Not Progressing Goal: Emotional status will improve Outcome: Not Progressing Goal: Mental status will improve Outcome: Not Progressing Goal: Verbalization of understanding the information provided will improve Outcome: Not Progressing   Problem: Safety: Goal: Periods of time without injury will increase Outcome: Not Progressing   Problem: Self-Concept: Goal: Ability to identify factors that promote anxiety will improve Outcome: Not Progressing Goal: Level of anxiety will decrease Outcome: Not Progressing Goal: Ability to modify  response to factors that promote anxiety will improve Outcome: Not Progressing

## 2023-05-07 NOTE — Progress Notes (Signed)
   05/07/23 1700  Charting Type  Charting Type Admission  Safety Check Verification  Has the RN verified the 15 minute safety check completion? Yes  Neurological  Neuro (WDL) WDL  HEENT  HEENT (WDL) WDL  Respiratory  Respiratory (WDL) WDL  Cardiac  Cardiac (WDL) WDL  Vascular  Vascular (WDL) WDL  Integumentary  Integumentary (WDL) WDL  Staff Member Assisting with Skin Assessment on Admission Shirley, RN  Braden Scale (Ages 8 and up)  Sensory Perceptions 4  Moisture 4  Activity 4  Mobility 4  Nutrition 3  Friction and Shear 3  Braden Scale Score 22  Musculoskeletal  Musculoskeletal (WDL) WDL  Gastrointestinal  Gastrointestinal (WDL) WDL  Last BM Date  05/05/23  GU Assessment  Genitourinary (WDL) WDL  Genitalia  Male Genitalia Intact  Neurological  Level of Consciousness Alert

## 2023-05-07 NOTE — Telephone Encounter (Signed)
 Chief Complaint: anxiety, insomnia Symptoms: pacing, anxiety, insomnia, chest pain Frequency: onset 1 week ago, worsened since Monday/Tuesday this week Pertinent Negatives: Brother denies patient use of drugs or alcohol, thoughts of self harm. Disposition: [x] ED /[x] Urgent Care (no appt availability in office) / [] Appointment(In office/virtual)/ []  [facility]/ [] Home Care/ [] Refused Recommended Disposition /[] [facility]/ []  Follow-up with PCP Additional Notes: Brother calling in for triage. He states patient was taken to the ED earlier this week and patient was given sleeping medication (hydroxyzine). He states the patient slept fine the first night and then last night he did not sleep at all, paced all night long. He states the patient has been set off because last week on Thursday he found out his ex and his son are living with another man now. Brother verbalizes understanding patient needs to seek immediate care; agreeable to take patient to ED or [facility]. New patient appointment scheduled for Monday at [facility].  Copied from CRM [id number]. Topic: Clinical - Red Word Triage >> May 07, 2023  8:17 AM Gaetano Hawthorne wrote: [facility] that prompted transfer to Nurse Triage: Patient's family concerned about the patient's anxiety and mental state - he spent all night walking back and forward - patient stated that he needs some help/medication. Reason for Disposition  [1] SEVERE anxiety (e.g., extremely anxious with intense emotional symptoms such as feeling of unreality, urge to flee, unable to calm down; unable to cope or function) AND [2] not better after 10 minutes of reassurance and Care Advice  Answer Assessment - Initial Assessment Questions 1. CONCERN: "Did anything happen that prompted you to call today?"      Brother states he had to sleep on the couch last night to keep an eye on the patient, he states he paced back and forth.He was  concerned the patient might try to leave the house. He states the patient only slept maybe 10 minutes on the couch.  2. ANXIETY SYMPTOMS: "Can you describe how you (your loved one; patient) have been feeling?" (e.g., tense, restless, panicky, anxious, keyed up, overwhelmed, sense of impending doom).      He states he was really upset and angry last week/weekend and then this week it turned into insomnia, "he was acting like a zombie", and pacing. Crying episodes.  3. ONSET: "How long have you been feeling this way?" (e.g., hours, days, weeks)     "Probably since about last week". He states the patient did not go to work Friday of last week or Monday this week.  4. SEVERITY: "How would you rate the level of anxiety?" (e.g., 0 - 10; or mild, moderate, severe).     10/10.  5. FUNCTIONAL IMPAIRMENT: "How have these feelings affected your ability to do daily activities?" "Have you had more difficulty than usual doing your normal daily activities?" (e.g., getting better, same, worse; self-care, school, work, interactions)     He has been able to eat, shower, brushed his teeth. Brother is concerned that he was drinking an excessive amount of bottled water and keeps adjusting his private areas. He has not been able to work this week.  6. HISTORY: "Have you felt this way before?" "Have you ever been diagnosed with an anxiety problem in the past?" (e.g., generalized anxiety disorder, panic attacks, PTSD). If Yes, ask: "How was this problem treated?" (e.g., medicines, counseling, etc.)     In 2019 he was in the psych ward of [facility]. He  was supposed to follow up with a mental health specialist, brother states he got up and left during his visit and did not return. He states he has had to be involuntary committed before.  7. RISK OF HARM - SUICIDAL IDEATION: "Do you ever have thoughts of hurting or killing yourself?" If Yes, ask:  "Do you have these feelings now?" "Do you have a plan on how you would do  this?"     Denies.  8. TREATMENT:  "What has been done so far to treat this anxiety?" (e.g., medicines, relaxation strategies). "What has helped?"     ER visit this week and was placed on hydroxyzine.  9. TREATMENT - THERAPIST: "Do you have a counselor or therapist? Name?"     No.  10. POTENTIAL TRIGGERS: "Do you drink caffeinated beverages (e.g., coffee, colas, teas), and how much daily?" "Do you drink alcohol or use any drugs?" "Have you started any new medicines recently?"       Denies any drug/alcohol/smoking.  11. PATIENT SUPPORT: "Who is with you now?" "Who do you live with?" "Do you have family or friends who you can talk to?"        Brother states the patient is in his room and lives with the brother and mother.  12. OTHER SYMPTOMS: "Do you have any other symptoms?" (e.g., feeling depressed, trouble concentrating, trouble sleeping, trouble breathing, palpitations or fast heartbeat, chest pain, sweating, nausea, or diarrhea)       High BP, was taken to ED 180/118; trouble sleeping, chest pain.  13. PREGNANCY: "Is there any chance you are pregnant?" "When was your last menstrual period?"       N/A.  Protocols used: Anxiety and Panic Attack-A-AH

## 2023-05-08 ENCOUNTER — Encounter: Payer: Self-pay | Admitting: Psychiatry

## 2023-05-08 DIAGNOSIS — F319 Bipolar disorder, unspecified: Principal | ICD-10-CM

## 2023-05-08 MED ORDER — DIVALPROEX SODIUM 250 MG PO DR TAB: 250.0000 mg | DELAYED_RELEASE_TABLET | Freq: Two times a day (BID) | ORAL | Status: DC

## 2023-05-08 MED ORDER — RISPERIDONE 1 MG PO TABS
1.0000 mg | ORAL_TABLET | Freq: Two times a day (BID) | ORAL | Status: DC
Start: 2023-05-08 — End: 2023-05-09
  Administered 2023-05-08 – 2023-05-09 (×3): 1 mg via ORAL
  Filled 2023-05-08 (×3): qty 1

## 2023-05-08 NOTE — Group Note (Signed)
 Date:  05/08/2023 Time:  10:25 AM  Group Topic/Focus:  Making Healthy Choices:   The focus of this group is to help patients identify negative/unhealthy choices they were using prior to admission and identify positive/healthier coping strategies to replace them upon discharge.    Participation Level:  Active  Participation Quality:  Appropriate  Affect:  Appropriate  Cognitive:  Appropriate  Insight: Appropriate  Engagement in Group:  Engaged  Modes of Intervention:  Discussion, Education, and Support  Additional Comments:    Wilford Corner 05/08/2023, 10:25 AM

## 2023-05-08 NOTE — Group Note (Signed)
 Date:  05/08/2023 Time:  6:18 PM  Group Topic/Focus:  Wellness Toolbox:   The focus of this group is to discuss various aspects of wellness, balancing those aspects and exploring ways to increase the ability to experience wellness.  Patients will create a wellness toolbox for use upon discharge.    Participation Level:  Active  Participation Quality:  Appropriate  Affect:  Appropriate  Cognitive:  Appropriate  Insight: Appropriate  Engagement in Group:  Engaged  Modes of Intervention:  Activity  Additional Comments:    Lynelle Smoke Idabell Picking 05/08/2023, 6:18 PM

## 2023-05-08 NOTE — Plan of Care (Signed)
  Problem: Activity: Goal: Interest or engagement in activities will improve Outcome: Progressing   Problem: Education: Goal: Emotional status will improve Outcome: Progressing   Problem: Education: Goal: Knowledge of [facility] Education information/materials will improve Outcome: Progressing
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
## 2023-05-08 NOTE — Plan of Care (Signed)
  Problem: Education: Goal: Knowledge of [facility] Education information/materials will improve Outcome: Progressing Goal: Emotional status will improve Outcome: Progressing Goal: Mental status will improve Outcome: Progressing   Problem: Activity: Goal: Interest or engagement in activities will improve Outcome: Progressing Goal: Sleeping patterns will improve Outcome: Progressing   Problem: Safety: Goal: Periods of time without injury will increase Outcome: Progressing   Problem: Self-Concept: Goal: Level of anxiety will decrease Outcome: Progressing

## 2023-05-08 NOTE — BHH Counselor (Signed)
 CSW attempted to contact pt's mom Acie Fredrickson [phone]) per his request.   CSW unable to reach, unable to leave VM.   Reynaldo Minium, MSW, [place] 05/08/2023 10:45 AM

## 2023-05-08 NOTE — Progress Notes (Signed)
   05/08/23 0855  Psych Admission Type (Psych Patients Only)  Admission Status Voluntary  Psychosocial Assessment  Patient Complaints Insomnia  Eye Contact Fair  Facial Expression Other (Comment) (Appropriate)  Affect Appropriate to circumstance  Speech Logical/coherent  Interaction Assertive  Motor Activity Slow  Appearance/Hygiene Unremarkable  Behavior Characteristics Cooperative;Appropriate to situation  Mood Pleasant  Aggressive Behavior  Effect No apparent injury  Thought Process  Coherency WDL  Content WDL  Delusions None reported or observed  Perception WDL  Hallucination None reported or observed  Judgment WDL  Confusion None  Danger to Self  Current suicidal ideation? Denies  Agreement Not to Harm Self Yes  Description of Agreement verbal  Danger to Others  Danger to Others None reported or observed

## 2023-05-08 NOTE — Group Note (Signed)
 Date:  05/08/2023 Time:  9:13 PM  Group Topic/Focus:  Developing a Wellness Toolbox:   The focus of this group is to help patients develop a "wellness toolbox" with skills and strategies to promote recovery upon discharge. Identifying Needs:   The focus of this group is to help patients identify their personal needs that have been historically problematic and identify healthy behaviors to address their needs. Overcoming Stress:   The focus of this group is to define stress and help patients assess their triggers.    Participation Level:  Active  Participation Quality:  Appropriate  Affect:  Appropriate  Cognitive:  Appropriate  Insight: Appropriate  Engagement in Group:  Engaged  Modes of Intervention:  Discussion  Additional Comments:  n/a  Lorenda Ishihara 05/08/2023, 9:13 PM

## 2023-05-08 NOTE — H&P (Signed)
 Psychiatric Admission Assessment Adult  Patient Identification: Louis Newton MRN:  [id number] Date of Evaluation:  05/08/2023 Chief Complaint:  Bipolar 1 disorder (HCC) [F31.9] Principal Diagnosis: Bipolar 1 disorder (HCC) Diagnosis:  Principal Problem:   Bipolar 1 disorder (HCC)  History of Present Illness:  38 year old never married African-American male with reported history of bipolar disorder, anxiety, brief psychosis, no reported medical history presented to [facility] with his brother, chief complaints of insomnia, recklessness, increased anxiety, depression, crying spells, irritability.  Patient was admitted voluntarily.  Patient is seen for evaluation today, states that he was brought in because he has been " kind of overstressing, over thinking things".  States that he has had significant anxiety, has not been able to sleep, reduced appetite, feeling worthless, increased energy level, " like I was feeling wound up".  The become has a tendency to accept that he has been experiencing auditory hallucinations, "it is not really boyfriend, I do not think there are hallucinations".  However he does describe them as voices " like they are blaming me for something, making me feel like I have done something".  They are usually present at nighttime, which makes it difficult for him to sleep.  He also reports that he feels as if he is being singled out by a Hydrographic surveyor in the city where he resides progress stating that he has been pulled over multiple times by the same cop, " it seems like he is out to get me".  He reports that symptoms have been going on for about a week now.  He does report recent life stressor, recently separated from the mother of his child, states that she was taking their son at around another man.  He endorses symptoms of anhedonia, depressed mood, difficulty concentrating, reduced appetite, difficulty sleeping.  He denies any hopelessness, worthlessness, guilt,  suicidal ideation.  He is reporting a history of irritable mood, decreased need for sleep, hypertalkativeness, flight of ideas, distractibility, increased energy levels, impulsive behaviors such as spending large sums of money.  He denies any substance use.  Does not take medications at home, does not follow up with outpatient mental health provider.  Last heard the voices last night, continues to be anxious rating this as a 5/10, depressed mood as a 7/10.  Affect is congruent.  Per chart review, was hospitalized at [facility] in 2019, involuntarily committed in 2017.  Has a history of taking Risperdal and Seroquel.  Patient denies any history of suicide attempts.  Denies any history of violence.  Denies any history of seizure, head trauma.  Paternal grandmother diagnosed with bipolar disorder.  No family history of suicide, substance abuse.  Lives at home with his brother and mother who are both supportive.  No history of abuse.  Completed high school.  No military history.  He works full-time [employer].  Never married, has a 109-year-old son.  He spiritual belief is [place].  No access to firearms or weapons.  Denies any legal history.  Denies any history of alcohol, tobacco, or illicit substance use.   Lipid panel WNL, A1c 5.1 CBC, TSH, T4, UDS unremarkable BMP unremarkable except for elevated creatinine at 1.32, we will recheck EKG NSR QTc 402  Associated Signs/Symptoms: Depression Symptoms:  depressed mood, insomnia, psychomotor agitation, difficulty concentrating, anxiety, disturbed sleep, decreased appetite, (Hypo) Manic Symptoms:  Distractibility, Hallucinations, Anxiety Symptoms:  Excessive Worry, Psychotic Symptoms:  Hallucinations: Auditory Paranoia, PTSD Symptoms: NA Total Time spent with patient: 1.5 hours  Past Psychiatric History:  DX: Per chart review bipolar, anxiety, brief psychotic disorder Outpatient provider: None Current caregiver:  Patient is  own guardian/ care giver Past hospitalizations: [facility] 2019, 2017 IVC [facility] Medication trials : Risperdal, Seroquel Suicide attempts: Denies Patient denies ever having an Act/CST team. Denies ECT, Clozaril treatments.  Is the patient at risk to self? No.  Has the patient been a risk to self in the past 6 months? No.  Has the patient been a risk to self within the distant past? No.  Is the patient a risk to others? No.  Has the patient been a risk to others in the past 6 months? No.  Has the patient been a risk to others within the distant past? No.   [place] Scale:  Flowsheet Row Admission (Current) from 05/07/2023 in [facility] INPATIENT BEHAVIORAL MEDICINE Most recent reading at 05/07/2023  6:00 PM ED from 05/07/2023 in [facility] Most recent reading at 05/07/2023 11:48 AM  C-SSRS RISK CATEGORY No Risk No Risk        Prior Inpatient Therapy: Yes.   If yes, describe see above Prior Outpatient Therapy: No.   Alcohol Screening: 1. How often do you have a drink containing alcohol?: Never 2. How many drinks containing alcohol do you have on a typical day when you are drinking?: 1 or 2 3. How often do you have six or more drinks on one occasion?: Never AUDIT-C Score: 0 4. How often during the last year have you found that you were not able to stop drinking once you had started?: Never 5. How often during the last year have you failed to do what was normally expected from you because of drinking?: Never 6. How often during the last year have you needed a first drink in the morning to get yourself going after a heavy drinking session?: Never 7. How often during the last year have you had a feeling of guilt of remorse after drinking?: Never 8. How often during the last year have you been unable to remember what happened the night before because you had been drinking?: Never 9. Have you or someone else been injured as a result of your drinking?: No 10. Has a  relative or friend or a doctor or another health worker been concerned about your drinking or suggested you cut down?: No Alcohol Use Disorder Identification Test Final Score (AUDIT): 0 Substance Abuse History in the last 12 months:  No. 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 No pertinent surgical history. Family History:  Family History  Problem Relation Age of Onset   Kidney disease Father    Heart disease Maternal Grandmother    Dementia Paternal Grandfather    Epilepsy Brother    Family Psychiatric  History:  Paternal grandmother-bipolar disorder Denies family history of suicide, substance abuse  Tobacco Screening:  Social History   Tobacco Use  Smoking Status Never  Smokeless Tobacco Never    BH Tobacco Counseling     Are you interested in Tobacco Cessation Medications?  N/A, patient does not use tobacco products Counseled patient on smoking cessation:  N/A, patient does not use tobacco products Reason Tobacco Screening Not Completed: No value filed.       Social History:  Social History   Substance and Sexual Activity  Alcohol Use Yes   Alcohol/week: 0.0 standard drinks of alcohol   Comment: social     Social History   Substance and Sexual Activity  Drug Use No    Additional Social History: Marital status: Single Are you sexually active?: No What is your sexual orientation?: "Straight" Has your sexual activity been affected by drugs, alcohol, medication, or emotional stress?: N/A Does patient have children?: Yes How many children?: 2 How is patient's relationship with their children?: Pt reports he has a daughter and a son. It is unclear at this time if the son is biologically the pt's as the pt  states, "he's my half son"                         Allergies:   Allergies  Allergen Reactions   Bee Venom Swelling   Lab Results:  Results for orders placed or performed during the hospital encounter of 05/07/23 (from the past 48 hours)  CBC with Differential/Platelet     Status: None   Collection Time: 05/07/23 11:35 AM  Result Value Ref Range   WBC 7.1 4.0 - 10.5 K/uL   RBC 5.27 4.22 - 5.81 MIL/uL   Hemoglobin 14.5 13.0 - 17.0 g/dL   HCT 75.6 43.3 - 29.5 %   MCV 82.0 80.0 - 100.0 fL   MCH 27.5 26.0 - 34.0 pg   MCHC 33.6 30.0 - 36.0 g/dL   RDW 18.8 41.6 - 60.6 %   Platelets 289 150 - 400 K/uL   nRBC 0.0 0.0 - 0.2 %   Neutrophils Relative % 80 %   Neutro Abs 5.7 1.7 - 7.7 K/uL   Lymphocytes Relative 10 %   Lymphs Abs 0.7 0.7 - 4.0 K/uL   Monocytes Relative 10 %   Monocytes Absolute 0.7 0.1 - 1.0 K/uL   Eosinophils Relative 0 %   Eosinophils Absolute 0.0 0.0 - 0.5 K/uL   Basophils Relative 0 %   Basophils Absolute 0.0 0.0 - 0.1 K/uL   Immature Granulocytes 0 %   Abs Immature Granulocytes 0.02 0.00 - 0.07 K/uL    Comment: Performed at [facility], [address], [place], [place] [address]  Comprehensive metabolic panel     Status: Abnormal   Collection Time: 05/07/23 11:35 AM  Result Value Ref Range   Sodium 138 135 - 145 mmol/L   Potassium 3.9 3.5 - 5.1 mmol/L   Chloride 103 98 - 111 mmol/L   CO2 24 22 - 32 mmol/L   Glucose, Bld 109 (H) 70 - 99 mg/dL    Comment: Glucose reference range applies only to samples taken after fasting for at least 8 hours.   BUN <5 (L) 6 - 20 mg/dL   Creatinine, Ser 1.09 (H) 0.61 - 1.24 mg/dL   Calcium 32.3 8.9 - 55.7 mg/dL   Total Protein 7.5 6.5 - 8.1 g/dL   Albumin 4.3 3.5 - 5.0 g/dL   AST 19 15 - 41 U/L   ALT 13 0 - 44 U/L   Alkaline Phosphatase 62 38 - 126 U/L   Total Bilirubin 0.9 0.0 - 1.2 mg/dL   GFR, Estimated >32 >20 mL/min    Comment: (NOTE) Calculated using the CKD-EPI Creatinine Equation (2021)     Anion gap 11 5 - 15    Comment: Performed at [facility], [address], [place], [place] [address]  Hemoglobin A1c  Status: None   Collection Time: 05/07/23 11:35 AM  Result Value Ref Range   Hgb A1c MFr Bld 5.1 4.8 - 5.6 %    Comment: (NOTE) Pre diabetes:          5.7%-6.4%  Diabetes:              >6.4%  Glycemic control for   <7.0% adults with diabetes    Mean Plasma Glucose 99.67 mg/dL    Comment: Performed at [facility], [address] [place], [place] [address]  Ethanol     Status: None   Collection Time: 05/07/23 11:35 AM  Result Value Ref Range   Alcohol, Ethyl (B) <10 <10 mg/dL    Comment: (NOTE) Lowest detectable limit for serum alcohol is 10 mg/dL.  For medical purposes only. Performed at [facility], [address] [place], [place] [address]   Lipid panel     Status: None   Collection Time: 05/07/23 11:35 AM  Result Value Ref Range   Cholesterol 154 0 - 200 mg/dL   Triglycerides 39 <865 mg/dL   HDL 48 >78 mg/dL   Total CHOL/HDL Ratio 3.2 RATIO   VLDL 8 0 - 40 mg/dL   LDL Cholesterol 98 0 - 99 mg/dL    Comment:        Total Cholesterol/HDL:CHD Risk Coronary Heart Disease Risk Table                     Men   Women  1/2 Average Risk   3.4   3.3  Average Risk       5.0   4.4  2 X Average Risk   9.6   7.1  3 X Average Risk  23.4   11.0        Use the calculated Patient Ratio above and the CHD Risk Table to determine the patient's CHD Risk.        ATP III CLASSIFICATION (LDL):  <100     mg/dL   Optimal  469-629  mg/dL   Near or Above                    Optimal  130-159  mg/dL   Borderline  528-413  mg/dL   High  >244     mg/dL   Very High Performed at [facility], [address], [place], [place] [address]   TSH     Status: None   Collection Time: 05/07/23 11:35 AM  Result Value Ref Range   TSH 1.870 0.350 - 4.500 uIU/mL    Comment: Performed by a 3rd Generation assay with a functional sensitivity of <=0.01  uIU/mL. Performed at [facility], [address], [place], [place] [address]   T4, free     Status: None   Collection Time: 05/07/23 11:35 AM  Result Value Ref Range   Free T4 0.88 0.61 - 1.12 ng/dL    Comment: (NOTE) Biotin ingestion may interfere with free T4 tests. If the results are inconsistent with the TSH level, previous test results, or the clinical presentation, then consider biotin interference. If needed, order repeat testing after stopping biotin. Performed at [facility], [address] [place], [place] [address]   POCT Urine Drug Screen - (I-Screen)     Status: Normal   Collection Time: 05/07/23 11:40 AM  Result Value Ref Range   POC Amphetamine UR None Detected    POC  Secobarbital (BAR) None Detected    POC Buprenorphine (BUP) None Detected    POC Oxazepam (BZO) None Detected    POC Cocaine UR None Detected    POC Methamphetamine UR None Detected    POC Morphine None Detected    POC Methadone UR None Detected    POC Oxycodone UR None Detected    POC Marijuana UR None Detected     Blood Alcohol level:  Lab Results  Component Value Date   ETH <10 05/07/2023   ETH <10 04/13/2017    Metabolic Disorder Labs:  Lab Results  Component Value Date   HGBA1C 5.1 05/07/2023   MPG 99.67 05/07/2023   Lab Results  Component Value Date   PROLACTIN 18.4 (H) 09/11/2015   Lab Results  Component Value Date   CHOL 154 05/07/2023   TRIG 39 05/07/2023   HDL 48 05/07/2023   CHOLHDL 3.2 05/07/2023   VLDL 8 05/07/2023   LDLCALC 98 05/07/2023   LDLCALC 88 09/11/2015    Current Medications: Current Facility-Administered Medications  Medication Dose Route Frequency Provider Last Rate Last Admin   acetaminophen (TYLENOL) tablet 650 mg  650 mg Oral Q6H PRN Howie Ill, NP   650 mg at 05/08/23 1329   alum & mag hydroxide-simeth (MAALOX/MYLANTA) 200-200-20 MG/5ML suspension 30 mL  30 mL Oral Q4H PRN Gaylyn Rong C, NP       haloperidol (HALDOL) tablet 5  mg  5 mg Oral TID PRN Howie Ill, NP       And   diphenhydrAMINE (BENADRYL) capsule 50 mg  50 mg Oral TID PRN Howie Ill, NP       haloperidol lactate (HALDOL) injection 5 mg  5 mg Intramuscular TID PRN Howie Ill, NP       And   diphenhydrAMINE (BENADRYL) injection 50 mg  50 mg Intramuscular TID PRN Howie Ill, NP       And   LORazepam (ATIVAN) injection 2 mg  2 mg Intramuscular TID PRN Howie Ill, NP       haloperidol lactate (HALDOL) injection 10 mg  10 mg Intramuscular TID PRN Howie Ill, NP       And   diphenhydrAMINE (BENADRYL) injection 50 mg  50 mg Intramuscular TID PRN Howie Ill, NP       And   LORazepam (ATIVAN) injection 2 mg  2 mg Intramuscular TID PRN Howie Ill, NP       divalproex (DEPAKOTE) DR tablet 250 mg  250 mg Oral Q12H Garth Diffley, PA-C       hydrOXYzine (ATARAX) tablet 25 mg  25 mg Oral TID PRN Gaylyn Rong C, NP   25 mg at 05/07/23 2342   magnesium hydroxide (MILK OF MAGNESIA) suspension 30 mL  30 mL Oral Daily PRN Howie Ill, NP       risperiDONE (RISPERDAL) tablet 1 mg  1 mg Oral BID Naylee Frankowski, PA-C       traZODone (DESYREL) tablet 50 mg  50 mg Oral QHS PRN Gaylyn Rong C, NP   50 mg at 05/07/23 2341   PTA Medications: No medications prior to admission.    Musculoskeletal: Strength & Muscle Tone: within normal limits Gait & Station: normal Patient leans: N/A            Psychiatric Specialty Exam:  Presentation  General Appearance:  Appropriate for Environment  Eye Contact: Fair  Speech: Normal Rate  Speech Volume: Decreased  Handedness:No  data recorded  Mood and Affect  Mood: Anxious; Depressed  Affect: Congruent   Thought Process  Thought Processes: Goal Directed  Duration of Psychotic Symptoms: 1 week Past Diagnosis of Schizophrenia or Psychoactive disorder: No  Descriptions of Associations:Intact  Orientation:Full (Time, Place and Person)  Thought  Content:Paranoid Ideation  Hallucinations:Hallucinations: Auditory Description of Auditory Hallucinations: " They are blaming me for something and making me feel like I have done something"  Ideas of Reference:Paranoia  Suicidal Thoughts:Suicidal Thoughts: No  Homicidal Thoughts:Homicidal Thoughts: No   Sensorium  Memory: Immediate Good; Recent Fair  Judgment: Fair  Insight: Fair   Art therapist  Concentration: Fair  Attention Span: Good  Recall: [employer] of Knowledge: Fair  Language: Fair   Psychomotor Activity  Psychomotor Activity: Psychomotor Activity: Normal   Assets  Assets: Communication Skills; Desire for Improvement; Housing; Physical Health; Social Support   Sleep  Sleep: Sleep: Fair Number of Hours of Sleep: 1    Physical Exam: Physical Exam Vitals and nursing note reviewed.  Constitutional:      Appearance: Normal appearance.  HENT:     Head: Normocephalic and atraumatic.  Eyes:     Extraocular Movements: Extraocular movements intact.  Pulmonary:     Effort: Pulmonary effort is normal.  Musculoskeletal:        General: Normal range of motion.     Cervical back: Normal range of motion.  Skin:    General: Skin is dry.  Neurological:     General: No focal deficit present.     Mental Status: He is alert and oriented to person, place, and time. Mental status is at baseline.  Psychiatric:        Attention and Perception: Attention normal. He perceives auditory hallucinations.        Mood and Affect: Mood is anxious and depressed.        Speech: Speech normal.        Behavior: Behavior normal. Behavior is cooperative.        Thought Content: Thought content is paranoid.        Cognition and Memory: Cognition and memory normal.     Comments: Judgement fair     Review of Systems  Psychiatric/Behavioral:  Positive for depression and hallucinations. The patient is nervous/anxious and has insomnia.   All other systems  reviewed and are negative.  Blood pressure (!) 139/96, pulse 92, temperature 98.1 F (36.7 C), resp. rate 18, height 5\' 9"  (1.753 m), weight 67.6 kg, SpO2 100%. Body mass index is 22 kg/m.  Treatment Plan Summary: Daily contact with patient to assess and evaluate symptoms and progress in treatment and Medication management  Observation Level/Precautions:  15 minute checks  Laboratory:   None  Psychotherapy:    Medications: Risperdal, Depakote  Consultations:    Discharge Concerns:    Estimated LOS: 5 to 7 days  Other:     Physician Treatment Plan for Primary Diagnosis: Bipolar 1 disorder (HCC) 1.    Safety and Monitoring:   --  Voluntary admission to inpatient psychiatric unit for safety, stabilization and treatment -- Daily contact with patient to assess and evaluate symptoms and progress in treatment -- Patient's case to be discussed in multi-disciplinary team meeting -- Observation Level : q15 minute checks -- Vital signs:  q12 hours -- Precautions: None  2. Psychiatric Diagnoses and Treatment:   -- Increase Risperdal to 1 mg twice daily for psychosis/mood  -- Continue trazodone 50 mg at bedtime as needed for sleep --  Continue hydroxyzine 25 mg 3 times daily as needed for anxiety  --  The risks/benefits/side-effects/alternatives to this medication were discussed in detail with the patient and time was given for questions. The patient consents to medication trial. -- Metabolic profile and EKG monitoring obtained while on an atypical antipsychotic  -- Encouraged patient to participate in unit milieu and in scheduled group therapies -- Short Term Goals: Ability to identify changes in lifestyle to reduce recurrence of condition will improve, Ability to verbalize feelings will improve, Ability to disclose and discuss suicidal ideas, Ability to demonstrate self-control will improve, Ability to identify and develop effective coping behaviors will improve, Ability to maintain clinical  measurements within normal limits will improve, Compliance with prescribed medications will improve, and Ability to identify triggers associated with substance abuse/mental health issues will improve -- Long Term Goals: Improvement in symptoms so as ready for discharge        3. Medical Issues Being Addressed:              None    4. Discharge Planning:   -- Social work and case management to assist with discharge planning and identification of hospital follow-up needs prior to discharge -- Estimated LOS: 5-7 days -- Discharge Concerns: Need to establish a safety plan; Medication compliance and effectiveness -- Discharge Goals: Return home with outpatient referrals for mental health follow-up including medication management/psychotherapy    I certify that inpatient services furnished can reasonably be expected to improve the patient's condition.    Skylene Deremer, PA-C 3/15/20253:16 PM

## 2023-05-08 NOTE — [facility] Suicide Risk Assessment (Signed)
 Suicide Risk Assessment  Admission Assessment    [facility] Admission Suicide Risk Assessment   Nursing information obtained from:  Patient Demographic factors:  Male Current Mental Status:  NA Loss Factors:  NA Historical Factors:  NA Risk Reduction Factors:  Sense of responsibility to family, Employed, Living with another person, especially a relative  Total Time spent with patient: 1.5 hours Principal Problem: Bipolar 1 disorder (HCC) Diagnosis:  Principal Problem:   Bipolar 1 disorder (HCC)  Subjective Data:  38 year old never married African-American male with reported history of bipolar disorder, anxiety, brief psychosis, no reported medical history presented [facility] with his brother, chief complaints of insomnia, recklessness, increased anxiety, depression, crying spells, irritability.  Patient was admitted voluntarily.   Patient is seen for evaluation today, states that he was brought in because he has been " kind of overstressing, over thinking things".  States that he has had significant anxiety, has not been able to sleep, reduced appetite, feeling worthless, increased energy level, " like I was feeling wound up".  The become has a tendency to accept that he has been experiencing auditory hallucinations, "it is not really boyfriend, I do not think there are hallucinations".  However he does describe them as voices " like they are blaming me for something, making me feel like I have done something".  They are usually present at nighttime, which makes it difficult for him to sleep.  He also reports that he feels as if he is being singled out by a Hydrographic surveyor in the city where he resides progress stating that he has been pulled over multiple times by the same cop, " it seems like he is out to get me".  He reports that symptoms have been going on for about a week now.  He does report recent life stressor, recently separated from the mother of his child, states that she was taking  their son at around another man.  He endorses symptoms of anhedonia, depressed mood, difficulty concentrating, reduced appetite, difficulty sleeping.  He denies any hopelessness, worthlessness, guilt, suicidal ideation.  He is reporting a history of irritable mood, decreased need for sleep, hypertalkativeness, flight of ideas, distractibility, increased energy levels, impulsive behaviors such as spending large sums of money.  He denies any substance use.  Does not take medications at home, does not follow up with outpatient mental health provider.  Last heard the voices last night, continues to be anxious rating this as a 5/10, depressed mood as a 7/10.  Affect is congruent.   Per chart review, was hospitalized at [facility] in 2019, involuntarily committed in 2017.  Has a history of taking Risperdal and Seroquel.  Patient denies any history of suicide attempts.  Denies any history of violence.  Denies any history of seizure, head trauma.  Paternal grandmother diagnosed with bipolar disorder.  No family history of suicide, substance abuse.  Lives at home with his brother and mother who are both supportive.  No history of abuse.  Completed high school.  No military history.  He works full-time [employer].  Never married, has a 63-year-old son.  He spiritual belief is [place].  No access to firearms or weapons.  Denies any legal history.  Denies any history of alcohol, tobacco, or illicit substance use.     Lipid panel WNL, A1c 5.1 CBC, TSH, T4, UDS unremarkable BMP unremarkable except for elevated creatinine at 1.32, we will recheck EKG NSR QTc 402   Continued Clinical Symptoms:  Alcohol Use Disorder Identification Test Final Score (AUDIT): 0 The "Alcohol Use Disorders Identification Test", Guidelines for Use in Primary Care, Second Edition.  World Science writer [facility]). Score between 0-7:  no or low risk or alcohol related problems. Score between 8-15:  moderate risk of alcohol  related problems. Score between 16-19:  high risk of alcohol related problems. Score 20 or above:  warrants further diagnostic evaluation for alcohol dependence and treatment.   CLINICAL FACTORS:   Severe Anxiety and/or Agitation Depression:   Anhedonia Delusional Insomnia Previous Psychiatric Diagnoses and Treatments   Musculoskeletal: Strength & Muscle Tone: within normal limits Gait & Station: normal Patient leans: N/A  Psychiatric Specialty Exam:  Presentation  General Appearance:  Appropriate for Environment  Eye Contact: Fair  Speech: Normal Rate  Speech Volume: Decreased  Handedness:No data recorded  Mood and Affect  Mood: Anxious; Depressed  Affect: Congruent   Thought Process  Thought Processes: Goal Directed  Descriptions of Associations:Intact  Orientation:Full (Time, Place and Person)  Thought Content:Paranoid Ideation  History of Schizophrenia/Schizoaffective disorder:No  Duration of Psychotic Symptoms:Less than six months  Hallucinations:Hallucinations: Auditory Description of Auditory Hallucinations: " They are blaming me for something and making me feel like I have done something"  Ideas of Reference:Paranoia  Suicidal Thoughts:Suicidal Thoughts: No  Homicidal Thoughts:Homicidal Thoughts: No   Sensorium  Memory: Immediate Good; Recent Fair  Judgment: Fair  Insight: Fair   Art therapist  Concentration: Fair  Attention Span: Good  Recall: [employer] of Knowledge: Fair  Language: Fair   Psychomotor Activity  Psychomotor Activity: Psychomotor Activity: Normal   Assets  Assets: Communication Skills; Desire for Improvement; Housing; Physical Health; Social Support   Sleep  Sleep: Sleep: Fair Number of Hours of Sleep: 1    Physical Exam: Physical Exam Vitals and nursing note reviewed.  Constitutional:      Appearance: Normal appearance.  HENT:     Head: Normocephalic and atraumatic.   Eyes:     Extraocular Movements: Extraocular movements intact.  Pulmonary:     Effort: Pulmonary effort is normal.  Musculoskeletal:        General: Normal range of motion.     Cervical back: Normal range of motion.  Skin:    General: Skin is dry.  Neurological:     General: No focal deficit present.     Mental Status: He is alert and oriented to person, place, and time. Mental status is at baseline.  Psychiatric:        Attention and Perception: Attention normal. He perceives auditory hallucinations.        Mood and Affect: Mood is anxious and depressed.        Speech: Speech normal.        Behavior: Behavior normal. Behavior is cooperative.        Thought Content: Thought content is paranoid.        Cognition and Memory: Cognition and memory normal.     Comments: Judgement fair       Review of Systems  Psychiatric/Behavioral:  Positive for depression and hallucinations. The patient is nervous/anxious and has insomnia.   All other systems reviewed and are negative.  Blood pressure (!) 139/96, pulse 92, temperature 98.1 F (36.7 C), resp. rate 18, height 5\' 9"  (1.753 m), weight 67.6 kg, SpO2 100%. Body mass index is 22 kg/m.   COGNITIVE FEATURES THAT CONTRIBUTE TO RISK:  None    SUICIDE RISK:   Minimal: No identifiable suicidal ideation.  Patients presenting  with no risk factors but with morbid ruminations; may be classified as minimal risk based on the severity of the depressive symptoms  PLAN OF CARE:  Crisis stabilization, psychiatric evaluation, therapeutic milieu, group participation, medication management for psychosis, insomnia, anxiety, and development of an individualized safety plan.  I certify that inpatient services furnished can reasonably be expected to improve the patient's condition.   Gianina Olinde, PA-C 05/08/2023, 4:11 PM

## 2023-05-09 DIAGNOSIS — F319 Bipolar disorder, unspecified: Secondary | ICD-10-CM | POA: Diagnosis not present

## 2023-05-09 MED ORDER — RISPERIDONE 1 MG PO TABS
2.0000 mg | ORAL_TABLET | Freq: Two times a day (BID) | ORAL | Status: DC
  Administered 2023-05-10 – 2023-05-11 (×4): 2 mg via ORAL
  Filled 2023-05-09 (×4): qty 2

## 2023-05-09 NOTE — Group Note (Signed)
 Date:  05/09/2023 Time:  8:50 PM  Group Topic/Focus:  Coping With Mental Health Crisis:   The purpose of this group is to help patients identify strategies for coping with mental health crisis.  Group discusses possible causes of crisis and ways to manage them effectively.    Participation Level:  Active  Participation Quality:  Appropriate  Affect:  Appropriate  Cognitive:  Appropriate  Insight: Appropriate  Engagement in Group:  Engaged  Modes of Intervention:  Activity  Additional Comments:    Pharell Rolfson 05/09/2023, 8:50 PM

## 2023-05-09 NOTE — Plan of Care (Signed)
  Problem: Education: Goal: Knowledge of [facility] Education information/materials will improve Outcome: Progressing   Problem: Education: Goal: Verbalization of understanding the information provided will improve Outcome: Progressing   Problem: Activity: Goal: Interest or engagement in activities will improve Outcome: Progressing

## 2023-05-09 NOTE — Progress Notes (Signed)
   05/08/23 2000  Psych Admission Type (Psych Patients Only)  Admission Status Voluntary  Psychosocial Assessment  Patient Complaints Anxiety;Restlessness  Eye Contact Fair  Facial Expression Flat  Affect Appropriate to circumstance;Irritable  Speech Logical/coherent;Slow  Interaction Assertive  Motor Activity Slow  Appearance/Hygiene Improved;Unremarkable  Behavior Characteristics Cooperative;Appropriate to situation;Calm  Mood Pleasant  Aggressive Behavior  Effect No apparent injury  Thought Process  Coherency WDL  Content WDL  Delusions None reported or observed  Perception WDL  Hallucination None reported or observed  Judgment WDL  Confusion None  Danger to Self  Current suicidal ideation? Denies  Danger to Others  Danger to Others None reported or observed   Patient appears less anxious, he is alert and oriented x 4, affect is congruent with mood, he is interacting appropriately with peers and staff, he denies SI/HI/AVH, 15 minutes safety checks maintained.

## 2023-05-09 NOTE — Plan of Care (Signed)
  Problem: Education: Goal: Knowledge of [facility] Education information/materials will improve Outcome: Progressing Goal: Emotional status will improve Outcome: Progressing Goal: Mental status will improve Outcome: Progressing Goal: Verbalization of understanding the information provided will improve Outcome: Progressing   Problem: Activity: Goal: Interest or engagement in activities will improve Outcome: Progressing Goal: Sleeping patterns will improve Outcome: Progressing   Problem: Coping: Goal: Ability to verbalize frustrations and anger appropriately will improve Outcome: Progressing Goal: Ability to demonstrate self-control will improve Outcome: Progressing   Problem: Health Behavior/Discharge Planning: Goal: Identification of resources available to assist in meeting health care needs will improve Outcome: Progressing Goal: Compliance with treatment plan for underlying cause of condition will improve Outcome: Progressing   Problem: Physical Regulation: Goal: Ability to maintain clinical measurements within normal limits will improve Outcome: Progressing   Problem: Safety: Goal: Periods of time without injury will increase Outcome: Progressing   Problem: Education: Goal: Knowledge of [place] [facility] Education information/materials will improve Outcome: Progressing Goal: Emotional status will improve Outcome: Progressing Goal: Mental status will improve Outcome: Progressing Goal: Verbalization of understanding the information provided will improve Outcome: Progressing   Problem: Safety: Goal: Periods of time without injury will increase Outcome: Progressing   Problem: Self-Concept: Goal: Ability to identify factors that promote anxiety will improve Outcome: Progressing Goal: Level of anxiety will decrease Outcome: Progressing Goal: Ability to modify response to factors that promote anxiety will improve Outcome: Progressing

## 2023-05-09 NOTE — [facility] Suicide Risk Assessment (Signed)
 [facility] INPATIENT:  Family/Significant Other Suicide Prevention Education  Suicide Prevention Education:  Education Completed; Acie Fredrickson, mom [phone],  has been identified by the patient as the family member/significant other with whom the patient will be residing, and identified as the person(s) who will aid the patient in the event of a mental health crisis (suicidal ideations/suicide attempt).  With written consent from the patient, the family member/significant other has been provided the following suicide prevention education, prior to the and/or following the discharge of the patient.  The suicide prevention education provided includes the following: Suicide risk factors Suicide prevention and interventions National Suicide Hotline telephone number [facility] assessment telephone number [facility] Emergency Assistance 911 [facility] and/or Residential Mobile Crisis Unit telephone number  Request made of family/significant other to: Remove weapons (e.g., guns, rifles, knives), all items previously/currently identified as safety concern.   Remove drugs/medications (over-the-counter, prescriptions, illicit drugs), all items previously/currently identified as a safety concern.  The family member/significant other verbalizes understanding of the suicide prevention education information provided.  The family member/significant other agrees to remove the items of safety concern listed above.  The LCSWA contacted the patient mom to provide SPI. The patient mom stated that the patient will be returning home and that there are no guns in the home. Mom stated that her only concern was the patient not sleeping. Stating that he would stay up all night and just walk around the room. Mom stated that she believed that him not sleeping is the cause of all the issues.    Marshell Levan 05/09/2023, 12:29 PM

## 2023-05-09 NOTE — Progress Notes (Signed)
 [facility] MD Progress Note  05/09/2023 11:15 PM Louis Newton  MRN:  [id number]  38 year old never married African-American male with reported history of bipolar disorder, anxiety, brief psychosis, no reported medical history presented to [facility] with his brother, chief complaints of insomnia, recklessness, increased anxiety, depression, crying spells, irritability.  Patient was admitted voluntarily.   Subjective: Patient's case discussed with multidisciplinary team, all vitals and notes were reviewed.  No behavioral issues overnight.  Patient is seen for reassessment, reports that he is doing okay, but feeling tired.  Reports that he was able to sleep without difficulties once he did fall asleep last night.  Did have some difficulties falling asleep due to paranoia, racing thoughts, anxiety, auditory hallucinations.  Reports that the hallucinations have reduced some, only occur at night.  Reports his appetite is fair.  Denies any current depression, anxiety, paranoia.  His brother did visit him yesterday, states his brother thought he was doing better, however he felt significantly anxious during the visit.  He has been attending groups.  Denies any SI/HI and AVH.  Affect is depressed, constricted.  He is med compliant.  Principal Problem: Bipolar 1 disorder (HCC) Diagnosis: Principal Problem:   Bipolar 1 disorder (HCC)  Total Time spent with patient: 30 minutes  Past Psychiatric History:  DX: Per chart review bipolar, anxiety, brief psychotic disorder Outpatient provider: None Current caregiver:  Patient is own guardian/ care giver Past hospitalizations: [facility] 2019, 2017 [facility] Medication trials : Risperdal, Seroquel Suicide attempts: Denies Patient denies ever having an Act/CST team. Denies ECT, Clozaril treatments.  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 No pertinent surgical history. Family History:  Family History  Problem Relation Age of Onset   Kidney disease Father    Heart disease Maternal Grandmother    Dementia Paternal Grandfather    Epilepsy Brother    Family Psychiatric  History:  Paternal grandmother-bipolar disorder Denies family history of suicide, substance abuse Social History:  Social History   Substance and Sexual Activity  Alcohol Use Yes   Alcohol/week: 0.0 standard drinks of alcohol   Comment: social     Social History   Substance and Sexual Activity  Drug Use No    Social History   Socioeconomic History   Marital status: Single    Spouse name: Not on file   Number of children: 1   Years of education: Not on file   Highest education level: High school graduate  Occupational History   Not on file  Tobacco Use   Smoking status: Never   Smokeless tobacco: Never  Vaping Use   Vaping status: Never Used  Substance and Sexual Activity   Alcohol use: Yes    Alcohol/week: 0.0 standard drinks of alcohol    Comment: social   Drug use: No   Sexual activity: Yes    Birth control/protection: None  Other Topics Concern   Not on file  Social History Narrative   Not on file   Social Drivers of Health   Financial Resource Strain: Not on file  Food Insecurity: No Food Insecurity (05/07/2023)   Hunger Vital Sign    Worried About Running Out of Food in the Last Year: Never true    Ran Out of Food in the Last Year: Never true  Transportation Needs: No Transportation Needs (05/07/2023)  PRAPARE - Administrator, Civil Service (Medical): No    Lack of Transportation (Non-Medical): No  Physical Activity: Not on file  Stress: Not on file  Social Connections: Not on file   Additional Social History:                         Sleep: Good  Appetite:  Good  Current Medications: Current Facility-Administered Medications  Medication Dose Route Frequency  Provider Last Rate Last Admin   acetaminophen (TYLENOL) tablet 650 mg  650 mg Oral Q6H PRN Howie Ill, NP   650 mg at 05/08/23 1329   alum & mag hydroxide-simeth (MAALOX/MYLANTA) 200-200-20 MG/5ML suspension 30 mL  30 mL Oral Q4H PRN Howie Ill, NP       haloperidol (HALDOL) tablet 5 mg  5 mg Oral TID PRN Howie Ill, NP       And   diphenhydrAMINE (BENADRYL) capsule 50 mg  50 mg Oral TID PRN Howie Ill, NP       haloperidol lactate (HALDOL) injection 5 mg  5 mg Intramuscular TID PRN Howie Ill, NP       And   diphenhydrAMINE (BENADRYL) injection 50 mg  50 mg Intramuscular TID PRN Howie Ill, NP       And   LORazepam (ATIVAN) injection 2 mg  2 mg Intramuscular TID PRN Howie Ill, NP       haloperidol lactate (HALDOL) injection 10 mg  10 mg Intramuscular TID PRN Howie Ill, NP       And   diphenhydrAMINE (BENADRYL) injection 50 mg  50 mg Intramuscular TID PRN Howie Ill, NP       And   LORazepam (ATIVAN) injection 2 mg  2 mg Intramuscular TID PRN Howie Ill, NP       hydrOXYzine (ATARAX) tablet 25 mg  25 mg Oral TID PRN Howie Ill, NP   25 mg at 05/07/23 2342   magnesium hydroxide (MILK OF MAGNESIA) suspension 30 mL  30 mL Oral Daily PRN Howie Ill, NP       [START ON 05/10/2023] risperiDONE (RISPERDAL) tablet 2 mg  2 mg Oral BID Jayan Raymundo, PA-C       traZODone (DESYREL) tablet 50 mg  50 mg Oral QHS PRN Howie Ill, NP   50 mg at 05/09/23 2149    Lab Results: No results found for this or any previous visit (from the past 48 hours).  Blood Alcohol level:  Lab Results  Component Value Date   ETH <10 05/07/2023   ETH <10 04/13/2017    Metabolic Disorder Labs: Lab Results  Component Value Date   HGBA1C 5.1 05/07/2023   MPG 99.67 05/07/2023   Lab Results  Component Value Date   PROLACTIN 18.4 (H) 09/11/2015   Lab Results  Component Value Date   CHOL 154 05/07/2023   TRIG 39 05/07/2023   HDL 48 05/07/2023    CHOLHDL 3.2 05/07/2023   VLDL 8 05/07/2023   LDLCALC 98 05/07/2023   LDLCALC 88 09/11/2015    Physical Findings: AIMS:  , ,  ,  ,    CIWA:    COWS:     Musculoskeletal: Strength & Muscle Tone: within normal limits Gait & Station: normal Patient leans: N/A  Psychiatric Specialty Exam:  Presentation  General Appearance:  Appropriate for Environment  Eye Contact: Fair  Speech: Normal Rate  Speech  Volume: Decreased  Handedness:No data recorded  Mood and Affect  Mood: Anxious; Depressed  Affect: Congruent   Thought Process  Thought Processes: Goal Directed  Descriptions of Associations:Intact  Orientation:Full (Time, Place and Person)  Thought Content:Paranoid Ideation  History of Schizophrenia/Schizoaffective disorder:No  Duration of Psychotic Symptoms:Less than six months  Hallucinations:Hallucinations: Auditory Description of Auditory Hallucinations: " They are blaming me for something and making me feel like I have done something"  Ideas of Reference:Paranoia  Suicidal Thoughts:Suicidal Thoughts: No  Homicidal Thoughts:Homicidal Thoughts: No   Sensorium  Memory: Immediate Good; Recent Fair  Judgment: Fair  Insight: Fair   Art therapist  Concentration: Fair  Attention Span: Good  Recall: [employer] of Knowledge: Fair  Language: Fair   Psychomotor Activity  Psychomotor Activity: Psychomotor Activity: Normal   Assets  Assets: Communication Skills; Desire for Improvement; Housing; Physical Health; Social Support   Sleep  Sleep: Sleep: Fair    Physical Exam: Physical Exam Vitals and nursing note reviewed.  HENT:     Head: Normocephalic and atraumatic.     Nose: Nose normal.     Mouth/Throat:     Mouth: Mucous membranes are moist.  Eyes:     Extraocular Movements: Extraocular movements intact.  Pulmonary:     Effort: Pulmonary effort is normal.  Musculoskeletal:        General: Normal range of  motion.  Skin:    General: Skin is warm and dry.  Neurological:     Mental Status: He is alert and oriented to person, place, and time.  Psychiatric:        Attention and Perception: Attention and perception normal.        Mood and Affect: Mood normal.        Speech: Speech normal.        Behavior: Behavior normal. Behavior is cooperative.        Thought Content: Thought content is paranoid.        Cognition and Memory: Cognition and memory normal.        Judgment: Judgment normal.     Comments: Depressed affect     Review of Systems  Psychiatric/Behavioral:  Positive for hallucinations.   All other systems reviewed and are negative.  Blood pressure (!) 131/92, pulse 94, temperature (!) 97.4 F (36.3 C), resp. rate 17, height 5\' 9"  (1.753 m), weight 67.6 kg, SpO2 100%. Body mass index is 22 kg/m.   Treatment Plan Summary:   Bipolar 1 disorder (HCC) 1.    Safety and Monitoring:   --  Voluntary admission to inpatient psychiatric unit for safety, stabilization and treatment -- Daily contact with patient to assess and evaluate symptoms and progress in treatment -- Patient's case to be discussed in multi-disciplinary team meeting -- Observation Level : q15 minute checks -- Vital signs:  q12 hours -- Precautions: None   2. Psychiatric Diagnoses and Treatment:   05/09/2023 -- Increase Risperdal 1 mg to 2 mg twice daily for psychosis/mood  -- Continue trazodone 50 mg at bedtime as needed for sleep -- Continue hydroxyzine 25 mg 3 times daily as needed for anxiety  05/08/2023 -- Increase Risperdal to 1 mg twice daily for psychosis/mood  -- Continue trazodone 50 mg at bedtime as needed for sleep -- Continue hydroxyzine 25 mg 3 times daily as needed for anxiety   --  The risks/benefits/side-effects/alternatives to this medication were discussed in detail with the patient and time was given for questions. The patient consents to medication trial. --  Metabolic profile and EKG  monitoring obtained while on an atypical antipsychotic  -- Encouraged patient to participate in unit milieu and in scheduled group therapies -- Short Term Goals: Ability to identify changes in lifestyle to reduce recurrence of condition will improve, Ability to verbalize feelings will improve, Ability to disclose and discuss suicidal ideas, Ability to demonstrate self-control will improve, Ability to identify and develop effective coping behaviors will improve, Ability to maintain clinical measurements within normal limits will improve, Compliance with prescribed medications will improve, and Ability to identify triggers associated with substance abuse/mental health issues will improve -- Long Term Goals: Improvement in symptoms so as ready for discharge        3. Medical Issues Being Addressed:              None    4. Discharge Planning:   -- Social work and case management to assist with discharge planning and identification of hospital follow-up needs prior to discharge -- Estimated LOS: 5-7 days -- Discharge Concerns: Need to establish a safety plan; Medication compliance and effectiveness -- Discharge Goals: Return home with outpatient referrals for mental health follow-up including medication management/psychotherapy   Paulene Floor, PA-C 05/09/2023, 11:15 PM

## 2023-05-09 NOTE — Progress Notes (Signed)
   05/09/23 0820  Psych Admission Type (Psych Patients Only)  Admission Status Voluntary  Psychosocial Assessment  Patient Complaints Insomnia  Eye Contact Fair  Facial Expression Flat  Affect Appropriate to circumstance  Speech Logical/coherent  Interaction Assertive  Motor Activity Slow  Appearance/Hygiene Unremarkable  Behavior Characteristics Cooperative;Appropriate to situation  Mood Pleasant  Aggressive Behavior  Effect No apparent injury  Thought Process  Coherency WDL  Content WDL  Delusions None reported or observed  Perception WDL  Hallucination None reported or observed  Judgment WDL  Confusion None  Danger to Self  Current suicidal ideation? Denies  Agreement Not to Harm Self Yes  Description of Agreement verbal  Danger to Others  Danger to Others None reported or observed

## 2023-05-10 ENCOUNTER — Ambulatory Visit: Payer: Self-pay | Admitting: Family Medicine

## 2023-05-10 DIAGNOSIS — F319 Bipolar disorder, unspecified: Secondary | ICD-10-CM | POA: Diagnosis not present

## 2023-05-10 DIAGNOSIS — F411 Generalized anxiety disorder: Secondary | ICD-10-CM | POA: Insufficient documentation

## 2023-05-10 MED ORDER — MELATONIN 5 MG PO TABS
5.0000 mg | ORAL_TABLET | Freq: Every day | ORAL | Status: DC
  Administered 2023-05-10 – 2023-05-12 (×3): 5 mg via ORAL
  Filled 2023-05-10 (×3): qty 1

## 2023-05-10 MED ORDER — BUSPIRONE HCL 5 MG PO TABS
7.5000 mg | ORAL_TABLET | Freq: Two times a day (BID) | ORAL | Status: DC
  Administered 2023-05-10 – 2023-05-13 (×6): 7.5 mg via ORAL
  Filled 2023-05-10 (×6): qty 2

## 2023-05-10 MED ORDER — DIVALPROEX SODIUM 125 MG PO DR TAB
125.0000 mg | DELAYED_RELEASE_TABLET | Freq: Two times a day (BID) | ORAL | Status: DC
  Administered 2023-05-11 – 2023-05-13 (×5): 125 mg via ORAL
  Filled 2023-05-10 (×7): qty 1

## 2023-05-10 NOTE — Progress Notes (Signed)
   05/09/23 2000  Psych Admission Type (Psych Patients Only)  Admission Status Voluntary  Psychosocial Assessment  Eye Contact Fair  Facial Expression Flat  Affect Appropriate to circumstance;Irritable  Speech Logical/coherent;Slow  Interaction Assertive  Motor Activity Slow  Appearance/Hygiene Improved;Unremarkable  Behavior Characteristics Cooperative;Appropriate to situation  Mood Pleasant  Aggressive Behavior  Effect No apparent injury  Thought Process  Coherency WDL  Content WDL  Delusions None reported or observed  Perception WDL  Hallucination None reported or observed  Judgment WDL  Confusion None  Danger to Self  Current suicidal ideation? Denies  Danger to Others  Danger to Others None reported or observed   Patient alert and oriented x 4, affect is flat but brightens upon approach. Patient appears less anxious, he was seen interacting with peers in the milieu and denies SI/HI/AVH. Patient is complaint with medication, 15 minutes safety checks maintained.

## 2023-05-10 NOTE — [facility] Counselor (Signed)
 CSW received phone call from pt brother, Sulayman Manning [phone]). Goheen just wanted to share that he would like to be a part of his brother's treatment. CSW informed him that his brother has signed consent for contact with him. Griffee was also informed that provider would be given his information as well. No other concerns expressed. Contact ended without incident.   Vilma Meckel. Algis Greenhouse, MSW, LCSW, LCAS 05/10/2023 10:03 AM

## 2023-05-10 NOTE — Group Note (Signed)
 [facility] LCSW Group Therapy Note    Group Date: 05/10/2023 Start Time: 1300 End Time: 1400  Type of Therapy and Topic:  Group Therapy:  Overcoming Obstacles  Participation Level:  [facility] PARTICIPATION LEVEL: Active   Description of Group:   In this group patients will be encouraged to explore what they see as obstacles to their own wellness and recovery. They will be guided to discuss their thoughts, feelings, and behaviors related to these obstacles. The group will process together ways to cope with barriers, with attention given to specific choices patients can make. Each patient will be challenged to identify changes they are motivated to make in order to overcome their obstacles. This group will be process-oriented, with patients participating in exploration of their own experiences as well as giving and receiving support and challenge from other group members.  Therapeutic Goals: 1. Patient will identify personal and current obstacles as they relate to admission. 2. Patient will identify barriers that currently interfere with their wellness or overcoming obstacles.  3. Patient will identify feelings, thought process and behaviors related to these barriers. 4. Patient will identify two changes they are willing to make to overcome these obstacles:    Summary of Patient Progress Patient was present for the entirety of the group process. He was actively engaged in the discussion. Patient acknowledged that he is trying to overcome his own negative thoughts and anxiety. He shared that he gets caught up in one thought and this just keeps replaying in his mind. He talked about the importance of taking his medication and following up with outpatient providers. He appeared to have some insight into himself and the topic. Patient appeared open and receptive to feedback/comments from his peers and facilitator.    Therapeutic Modalities:   Cognitive  Behavioral Therapy Solution Focused Therapy Motivational Interviewing Relapse Prevention Therapy   Glenis Smoker, LCSW

## 2023-05-10 NOTE — Group Note (Signed)
 Date:  05/10/2023 Time:  5:24 PM  Group Topic/Focus:  Wellness Toolbox:   The focus of this group is to discuss various aspects of wellness, balancing those aspects and exploring ways to increase the ability to experience wellness.  Patients will create a wellness toolbox for use upon discharge.    Participation Level:  Active  Participation Quality:  Appropriate  Affect:  Appropriate  Cognitive:  Appropriate  Insight: Appropriate  Engagement in Group:  Engaged  Modes of Intervention:  Activity  Additional Comments:    Wilford Corner 05/10/2023, 5:24 PM

## 2023-05-10 NOTE — Group Note (Signed)
 Recreation Therapy Group Note   Group Topic:Coping Skills  Group Date: 05/10/2023 Start Time: 1000 End Time: 1055 Facilitators: Rosina Lowenstein, LRT, CTRS Location:  Craft Room  Group Description: Coping A-Z. LRT and patients engage in a guided discussion on what coping skills are and gave specific examples. LRT passed out a handout labeled Coping A-Z with blank spaces beside each letter. LRT prompted patients to come up with a coping skill for each of the letters. LRT and patients went over the handout and gave ideas for each letter if anyone had any blanks left on their paper. Patients kept this handout with them that listed 26 different coping skills.   Goal Area(s) Addressed: Patients will be able to define "coping skills". Patient will identify new coping skills.  Patient will increase communication.   Affect/Mood: Appropriate and Flat   Participation Level: Moderate   Participation Quality: Independent   Behavior: Calm and Cooperative   Speech/Thought Process: Coherent   Insight: Fair   Judgement: Fair    Modes of Intervention: Clarification, Education, Guided Discussion, Socialization, Worksheet, and Writing   Patient Response to Interventions:  Receptive   Education Outcome:  In group clarification offered    Clinical Observations/Individualized Feedback: Tovia was mostly active in their participation of session activities and group discussion. Pt identified "boxing and video games" as coping skills. Pt minimally interacted with LRT and peers while present in group.    Plan: Continue to engage patient in RT group sessions 2-3x/week.   Rosina Lowenstein, LRT, CTRS 05/10/2023 1:42 PM

## 2023-05-10 NOTE — Group Note (Signed)
 Date:  05/10/2023 Time:  5:50 PM  Group Topic/Focus:  Goals Group:   The focus of this group is to help patients establish daily goals to achieve during treatment and discuss how the patient can incorporate goal setting into their daily lives to aide in recovery.    Participation Level:  Did Not Attend   Lynelle Smoke [facility] 05/10/2023, 5:50 PM

## 2023-05-10 NOTE — Progress Notes (Cosign Needed)
 [facility] MD Progress Note  05/10/2023 9:21 PM Louis Newton  MRN:  [id number] Subjective:  38 year old African American male, Reports difficulty sleeping and ongoing concerns with current medication regimen.Expresses motivation for treatment, stating "I want to get back on my medication and learning coping skills."Currently on Risperdal but reports side effects, including heart racing and insomnia, requiring further assessment and medication adjustment.Displays a pleasant mood, cooperative behavior, and logical speech, but continues to require stabilization before discharge. 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 No pertinent surgical history. Family History:  Family History  Problem Relation Age of Onset   Kidney disease Father    Heart disease Maternal Grandmother    Dementia Paternal Grandfather    Epilepsy Brother    Family Psychiatric  History: see above Social History:  Social History   Substance and Sexual Activity  Alcohol Use Yes   Alcohol/week: 0.0 standard drinks of alcohol   Comment: social     Social History   Substance and Sexual Activity  Drug Use No    Social History   Socioeconomic History   Marital status: Single    Spouse name: Not on file   Number of children: 1   Years of education: Not on file   Highest education level: High school graduate  Occupational History   Not on file  Tobacco Use   Smoking status: Never   Smokeless tobacco: Never  Vaping Use   Vaping status: Never Used  Substance and Sexual Activity   Alcohol use: Yes    Alcohol/week: 0.0 standard drinks of  alcohol    Comment: social   Drug use: No   Sexual activity: Yes    Birth control/protection: None  Other Topics Concern   Not on file  Social History Narrative   Not on file   Social Drivers of Health   Financial Resource Strain: Not on file  Food Insecurity: No Food Insecurity (05/07/2023)   Hunger Vital Sign    Worried About Running Out of Food in the Last Year: Never true    Ran Out of Food in the Last Year: Never true  Transportation Needs: No Transportation Needs (05/07/2023)   PRAPARE - Administrator, Civil Service (Medical): No    Lack of Transportation (Non-Medical): No  Physical Activity: Not on file  Stress: Not on file  Social Connections: Not on file   Additional Social History:                         Sleep: Good  Appetite:  Good  Current Medications: Current Facility-Administered Medications  Medication Dose Route Frequency Provider Last Rate Last Admin   acetaminophen (TYLENOL) tablet 650 mg  650 mg Oral Q6H PRN Gaylyn Rong C, NP   650 mg at 05/08/23 1329   alum & mag hydroxide-simeth (MAALOX/MYLANTA) 200-200-20 MG/5ML suspension 30 mL  30 mL Oral Q4H PRN Gaylyn Rong C, NP       haloperidol (HALDOL) tablet 5 mg  5 mg Oral TID PRN Howie Ill, NP       And   diphenhydrAMINE (BENADRYL) capsule  50 mg  50 mg Oral TID PRN Howie Ill, NP       haloperidol lactate (HALDOL) injection 5 mg  5 mg Intramuscular TID PRN Howie Ill, NP       And   diphenhydrAMINE (BENADRYL) injection 50 mg  50 mg Intramuscular TID PRN Howie Ill, NP       And   LORazepam (ATIVAN) injection 2 mg  2 mg Intramuscular TID PRN Howie Ill, NP       haloperidol lactate (HALDOL) injection 10 mg  10 mg Intramuscular TID PRN Howie Ill, NP       And   diphenhydrAMINE (BENADRYL) injection 50 mg  50 mg Intramuscular TID PRN Howie Ill, NP       And   LORazepam (ATIVAN) injection 2 mg  2 mg Intramuscular TID PRN Howie Ill, NP        hydrOXYzine (ATARAX) tablet 25 mg  25 mg Oral TID PRN Howie Ill, NP   25 mg at 05/10/23 0028   magnesium hydroxide (MILK OF MAGNESIA) suspension 30 mL  30 mL Oral Daily PRN Howie Ill, NP       melatonin tablet 5 mg  5 mg Oral QHS Myriam Forehand, NP       risperiDONE (RISPERDAL) tablet 2 mg  2 mg Oral BID Tingling, Stephanie, PA-C   2 mg at 05/10/23 1805   traZODone (DESYREL) tablet 50 mg  50 mg Oral QHS PRN Howie Ill, NP   50 mg at 05/09/23 2149    Lab Results: No results found for this or any previous visit (from the past 48 hours).  Blood Alcohol level:  Lab Results  Component Value Date   ETH <10 05/07/2023   ETH <10 04/13/2017    Metabolic Disorder Labs: Lab Results  Component Value Date   HGBA1C 5.1 05/07/2023   MPG 99.67 05/07/2023   Lab Results  Component Value Date   PROLACTIN 18.4 (H) 09/11/2015   Lab Results  Component Value Date   CHOL 154 05/07/2023   TRIG 39 05/07/2023   HDL 48 05/07/2023   CHOLHDL 3.2 05/07/2023   VLDL 8 05/07/2023   LDLCALC 98 05/07/2023   LDLCALC 88 09/11/2015    Physical Findings: AIMS:  , ,  ,  ,    CIWA:    COWS:     Musculoskeletal: Strength & Muscle Tone: within normal limits Gait & Station: normal Patient leans: N/A  Psychiatric Specialty Exam:  Presentation  General Appearance:  Appropriate for Environment  Eye Contact: Fair  Speech: Normal Rate  Speech Volume: Decreased  Handedness:No data recorded  Mood and Affect  Mood: Anxious; Depressed  Affect: Congruent   Thought Process  Thought Processes: Goal Directed  Descriptions of Associations:Intact  Orientation:Full (Time, Place and Person)  Thought Content:Paranoid Ideation  History of Schizophrenia/Schizoaffective disorder:No  Duration of Psychotic Symptoms:Less than six months  Hallucinations:No data recorded Ideas of Reference:Paranoia  Suicidal Thoughts:No data recorded Homicidal Thoughts:No data  recorded  Sensorium  Memory: Immediate Good; Recent Fair  Judgment: Fair  Insight: Fair   Art therapist  Concentration: Fair  Attention Span: Good  Recall: [employer] of Knowledge: Fair  Language: Fair   Psychomotor Activity  Psychomotor Activity:No data recorded  Assets  Assets: Communication Skills; Desire for Improvement; Housing; Physical Health; Social Support   Sleep  Sleep:No data recorded   Physical Exam: Physical Exam Vitals and nursing note reviewed.  Constitutional:      Appearance: Normal appearance.  HENT:     Head: Normocephalic and atraumatic.     Nose: Nose normal.  Pulmonary:     Effort: Pulmonary effort is normal.  Musculoskeletal:        General: Normal range of motion.     Cervical back: Normal range of motion.  Neurological:     General: No focal deficit present.     Mental Status: He is alert and oriented to person, place, and time. Mental status is at baseline.  Psychiatric:        Attention and Perception: Attention and perception normal.        Mood and Affect: Mood is anxious. Affect is flat.        Speech: Speech normal.        Behavior: Behavior normal. Behavior is cooperative.        Thought Content: Thought content normal.        Cognition and Memory: Cognition and memory normal.        Judgment: Judgment is impulsive.    Review of Systems  Psychiatric/Behavioral:  The patient is nervous/anxious and has insomnia.   All other systems reviewed and are negative.  Blood pressure (!) 128/91, pulse 85, temperature (!) 97.5 F (36.4 C), resp. rate 17, height 5\' 9"  (1.753 m), weight 67.6 kg, SpO2 100%. Body mass index is 22 kg/m.   Treatment Plan Summary: Daily contact with patient to assess and evaluate symptoms and progress in treatment and Medication management Continue Risperdal (Risperidone) 1 mg BID for psychotic symptoms. Start Depakote (Valproate) 125 mg BID for mood stabilization. Start Buspar  (Buspirone) 7.5 mg BID for generalized anxiety Melatonin 5 mg for sleep aid Monitor the patient every 15 minutes due to medication adjustments and risk assessment. Assess for side effects related to Depakote (GI distress, sedation, tremors). Monitor for continued insomnia and adjust medications accordingly.  Evaluate response to Buspar and adjust dose if needed for optimal anxiety control Myriam Forehand, NP 05/10/2023, 9:21 PM

## 2023-05-10 NOTE — BH IP Treatment Plan (Signed)
 Interdisciplinary Treatment and Diagnostic Plan Update  05/10/2023 Time of Session: 2:57PM Louis Newton MRN: [id number]  Principal Diagnosis: Bipolar 1 disorder [facility])  Secondary Diagnoses: Principal Problem:   Bipolar 1 disorder (HCC)   Current Medications:  Current Facility-Administered Medications  Medication Dose Route Frequency Provider Last Rate Last Admin   acetaminophen (TYLENOL) tablet 650 mg  650 mg Oral Q6H PRN Howie Ill, NP   650 mg at 05/08/23 1329   alum & mag hydroxide-simeth (MAALOX/MYLANTA) 200-200-20 MG/5ML suspension 30 mL  30 mL Oral Q4H PRN Howie Ill, NP       haloperidol (HALDOL) tablet 5 mg  5 mg Oral TID PRN Howie Ill, NP       And   diphenhydrAMINE (BENADRYL) capsule 50 mg  50 mg Oral TID PRN Howie Ill, NP       haloperidol lactate (HALDOL) injection 5 mg  5 mg Intramuscular TID PRN Howie Ill, NP       And   diphenhydrAMINE (BENADRYL) injection 50 mg  50 mg Intramuscular TID PRN Howie Ill, NP       And   LORazepam (ATIVAN) injection 2 mg  2 mg Intramuscular TID PRN Howie Ill, NP       haloperidol lactate (HALDOL) injection 10 mg  10 mg Intramuscular TID PRN Howie Ill, NP       And   diphenhydrAMINE (BENADRYL) injection 50 mg  50 mg Intramuscular TID PRN Howie Ill, NP       And   LORazepam (ATIVAN) injection 2 mg  2 mg Intramuscular TID PRN Howie Ill, NP       hydrOXYzine (ATARAX) tablet 25 mg  25 mg Oral TID PRN Howie Ill, NP   25 mg at 05/10/23 0028   magnesium hydroxide (MILK OF MAGNESIA) suspension 30 mL  30 mL Oral Daily PRN Howie Ill, NP       risperiDONE (RISPERDAL) tablet 2 mg  2 mg Oral BID Tingling, Stephanie, PA-C   2 mg at 05/10/23 0814   traZODone (DESYREL) tablet 50 mg  50 mg Oral QHS PRN Howie Ill, NP   50 mg at 05/09/23 2149   PTA Medications: No medications prior to admission.    Patient Stressors: Medication change or noncompliance   Other: Insomnia     Patient Strengths: Ability for insight  Arboriculturist fund of knowledge  Motivation for treatment/growth  Physical Health  Supportive family/friends  Work skills   Treatment Modalities: Medication Management, Group therapy, Case management,  1 to 1 session with clinician, Psychoeducation, Recreational therapy.   Physician Treatment Plan for Primary Diagnosis: Bipolar 1 disorder (HCC) Long Term Goal(s):     Short Term Goals:    Medication Management: Evaluate patient's response, side effects, and tolerance of medication regimen.  Therapeutic Interventions: 1 to 1 sessions, Unit Group sessions and Medication administration.  Evaluation of Outcomes: Not Met  Physician Treatment Plan for Secondary Diagnosis: Principal Problem:   Bipolar 1 disorder (HCC)  Long Term Goal(s):     Short Term Goals:       Medication Management: Evaluate patient's response, side effects, and tolerance of medication regimen.  Therapeutic Interventions: 1 to 1 sessions, Unit Group sessions and Medication administration.  Evaluation of Outcomes: Not Met   RN Treatment Plan for Primary Diagnosis: Bipolar 1 disorder (HCC) Long Term Goal(s): Knowledge of disease and therapeutic regimen to maintain health  will improve  Short Term Goals: Ability to demonstrate self-control, Ability to participate in decision making will improve, Ability to verbalize feelings will improve, Ability to disclose and discuss suicidal ideas, Ability to identify and develop effective coping behaviors will improve, and Compliance with prescribed medications will improve  Medication Management: RN will administer medications as ordered by provider, will assess and evaluate patient's response and provide education to patient for prescribed medication. RN will report any adverse and/or side effects to prescribing provider.  Therapeutic Interventions: 1 on 1 counseling sessions, Psychoeducation,  Medication administration, Evaluate responses to treatment, Monitor vital signs and CBGs as ordered, Perform/monitor CIWA, COWS, AIMS and Fall Risk screenings as ordered, Perform wound care treatments as ordered.  Evaluation of Outcomes: Not Met   LCSW Treatment Plan for Primary Diagnosis: Bipolar 1 disorder (HCC) Long Term Goal(s): Safe transition to appropriate next level of care at discharge, Engage patient in therapeutic group addressing interpersonal concerns.  Short Term Goals: Engage patient in aftercare planning with referrals and resources, Increase social support, Increase ability to appropriately verbalize feelings, Increase emotional regulation, Facilitate acceptance of mental health diagnosis and concerns, Facilitate patient progression through stages of change regarding substance use diagnoses and concerns, Identify triggers associated with mental health/substance abuse issues, and Increase skills for wellness and recovery  Therapeutic Interventions: Assess for all discharge needs, 1 to 1 time with Social worker, Explore available resources and support systems, Assess for adequacy in community support network, Educate family and significant other(s) on suicide prevention, Complete Psychosocial Assessment, Interpersonal group therapy.  Evaluation of Outcomes: Not Met   Progress in Treatment: Attending groups: Yes. Participating in groups: Yes. Taking medication as prescribed: Yes. Toleration medication: Yes. Family/Significant other contact made: Yes, individual(s) contacted:  SPE completed with the patient's family Patient understands diagnosis: Yes. Discussing patient identified problems/goals with staff: Yes. Medical problems stabilized or resolved: Yes. Denies suicidal/homicidal ideation: Yes. Issues/concerns per patient self-inventory: No. Other: none  New problem(s) identified: No, Describe:  none  New Short Term/Long Term Goal(s): detox, elimination of symptoms of  psychosis, medication management for mood stabilization; elimination of SI thoughts; development of comprehensive mental wellness/sobriety plan.   Patient Goals:  "to try and have enough coping mechanisms I can figure everything out and be less stressed"  Discharge Plan or Barriers: CSW to assist patient in development of appropriate discharge plans.  Patient reports that he would like to return home and begin outpatient services.    Reason for Continuation of Hospitalization: Anxiety Depression Medication stabilization Suicidal ideation  Estimated Length of Stay:  1-7 days  Last 3 [place] Suicide Severity Risk Score: Flowsheet Row Admission (Current) from 05/07/2023 in [facility] INPAT[facility] BEH[facility]INE Most recent reading at 05/07/2023  6:00 PM ED from 05/07/2023 in [facility] Most recent reading at 05/07/2023 11:48 AM  C-SSRS RISK CATEGORY No Risk No Risk       Last PHQ 2/9 Scores:    09/22/2016   11:02 AM 04/01/2016   12:02 PM 09/05/2015    3:19 PM  Depression screen PHQ 2/9  Decreased Interest 0 0 0  Down, Depressed, Hopeless 0 0 1  PHQ - 2 Score 0 0 1    Scribe for Treatment Team: Harden Mo, Alexander Mt 05/10/2023 3:46 PM

## 2023-05-10 NOTE — Progress Notes (Addendum)
 Pt presents, pleasant, cooperation and denies psychiatric symptoms. Pt is visible in the milieu and attend scheduled groups. Pt als, is compliant with taking his scheduled medications and stated that each times he takes Risperdal "My heart beats really fast and I don't want to take it anymore". Pt advised to talk with the provider about sx. and nurse advise NP.    05/10/23 1000  Psych Admission Type (Psych Patients Only)  Admission Status Voluntary  Psychosocial Assessment  Patient Complaints Insomnia  Eye Contact Fair  Facial Expression Flat  Affect Appropriate to circumstance  Speech Logical/coherent  Interaction Assertive  Motor Activity Slow  Appearance/Hygiene Improved  Behavior Characteristics Cooperative;Appropriate to situation  Mood Pleasant  Aggressive Behavior  Effect No apparent injury  Thought Process  Coherency WDL  Content WDL  Delusions None reported or observed  Perception WDL  Hallucination None reported or observed  Judgment WDL  Confusion None  Danger to Self  Current suicidal ideation? Denies  Agreement Not to Harm Self Yes  Description of Agreement verbal  Danger to Others  Danger to Others None reported or observed

## 2023-05-11 DIAGNOSIS — F319 Bipolar disorder, unspecified: Secondary | ICD-10-CM | POA: Diagnosis not present

## 2023-05-11 LAB — LIPID PANEL
Cholesterol: 139 mg/dL (ref 0–200)
HDL: 37 mg/dL — ABNORMAL LOW (ref >40)
LDL Cholesterol: 91 mg/dL (ref 0–99)
Total CHOL/HDL Ratio: 3.8 ratio
Triglycerides: 54 mg/dL (ref <150)
VLDL: 11 mg/dL (ref 0–40)

## 2023-05-11 LAB — HEMOGLOBIN A1C
Hgb A1c MFr Bld: 5.2 % (ref 4.8–5.6)
Mean Plasma Glucose: 102.54 mg/dL

## 2023-05-11 MED ORDER — RISPERIDONE 1 MG PO TABS
1.0000 mg | ORAL_TABLET | Freq: Two times a day (BID) | ORAL | Status: DC
Start: 2023-05-12 — End: 2023-05-13
  Administered 2023-05-12 – 2023-05-13 (×3): 1 mg via ORAL
  Filled 2023-05-11 (×3): qty 1

## 2023-05-11 NOTE — Group Note (Signed)
 Recreation Therapy Group Note   Group Topic:Healthy Support Systems  Group Date: 05/11/2023 Start Time: 1000 End Time: 1055 Facilitators: Rosina Lowenstein, LRT, CTRS Location: Craft Room  Group Description: Straw Bridge. Individually, patients were given 10 plastic drinking straws and an equal length of masking tape. Using the materials provided, patients were instructed to build a free-standing bridge-like structure to suspend an everyday item (ex: puzzle box) off the floor or table surface. All materials were required to be used in Secondary school teacher. LRT facilitated post-activity discussion reviewing how we, humans, are like the structure we built; when things get too heavy in our life and we do not have adequate supports/coping skills, then we will fall just like the straw-built structure will. LRT focused on how having a "base" or structure on the bottom was necessary for the object to stand, meaning we must be secure and stable first before building on ourselves or others. Patients were encouraged to name 2 healthy supports in their life and reflect on how the skills used in this activity can be generalized to daily life post discharge.  Goal Area(s) Addressed:  Patient will identify two healthy support systems in their life. Patient will work on Product manager. Patient will verbalize the importance of having a strong and steady "base".  Patient will follow multi-step directions. Patients will engage in creativity and use all provided materials.   Affect/Mood: Appropriate   Participation Level: Active and Engaged   Participation Quality: Independent   Behavior: Appropriate, Calm, and Cooperative   Speech/Thought Process: Coherent   Insight: Good   Judgement: Good   Modes of Intervention: Guided Discussion, Problem-solving, and STEM Activity   Patient Response to Interventions:  Attentive, Engaged, Interested , and Receptive   Education Outcome:  Acknowledges  education   Clinical Observations/Individualized Feedback: Louis Newton was active in their participation of session activities and group discussion. Pt identified "my family and having a positive attitude" as his supports. Pt successfully completed a straw structure. Pt interacted well with LRT and peers duration of session.    Plan: Continue to engage patient in RT group sessions 2-3x/week.   Rosina Lowenstein, LRT, CTRS 05/11/2023 12:54 PM

## 2023-05-11 NOTE — Plan of Care (Signed)
 ?  Problem: Education: ?Goal: Mental status will improve ?Outcome: Progressing ?Goal: Verbalization of understanding the information provided will improve ?Outcome: Progressing ?  ?

## 2023-05-11 NOTE — Group Note (Signed)
 Date:  05/11/2023 Time:  10:13 AM  Group Topic/Focus:  Goals Group:   The focus of this group is to help patients establish daily goals to achieve during treatment and discuss how the patient can incorporate goal setting into their daily lives to aide in recovery.    Participation Level:  Did Not Attend   Lynelle Smoke [facility] 05/11/2023, 10:13 AM

## 2023-05-11 NOTE — Progress Notes (Signed)
 Patient pleasant and cooperative with care. Medication compliant. Appropriate with staff and peers. Denies SI, HI, AVH. No complaints or concerns voiced. Visible in milieu. Encouragement and support provided. Safety checks maintained. Medications given as prescribed. Pt receptive and remains safe on unit with q 15 min checks.

## 2023-05-11 NOTE — [facility] Group Notes (Signed)
 [facility] Group Notes:  (Nursing/MHT/Case Management/Adjunct)  Date:  05/11/2023  Time:  2:15 AM  Type of Therapy:  Psychoeducational Skills  Participation Level:  Did Not Attend  Participation Quality:  Resistant  Affect:  Resistant  Cognitive:  Lacking  Insight:  None  Engagement in Group:  None  Modes of Intervention:  Education  Summary of Progress/Problems: The patient did not attend group this evening.   Braheem Tomasik S 05/11/2023, 2:15 AM

## 2023-05-11 NOTE — Progress Notes (Signed)
   05/11/23 1200  Psych Admission Type (Psych Patients Only)  Admission Status Voluntary  Psychosocial Assessment  Patient Complaints None  Eye Contact Fair  Facial Expression Other (Comment) (WNL)  Affect Appropriate to circumstance  Speech Logical/coherent  Interaction Assertive  Motor Activity Slow  Appearance/Hygiene Unremarkable  Behavior Characteristics Cooperative  Mood Pleasant  Aggressive Behavior  Effect No apparent injury  Thought Process  Coherency WDL  Content WDL  Delusions None reported or observed  Perception WDL  Hallucination None reported or observed  Judgment Impaired  Confusion None  Danger to Self  Current suicidal ideation? Denies  Agreement Not to Harm Self Yes  Description of Agreement verbal  Danger to Others  Danger to Others None reported or observed

## 2023-05-11 NOTE — Group Note (Signed)
 [facility] LCSW Group Therapy Note   Group Date: 05/11/2023 Start Time: 1300 End Time: 1400   Type of Therapy and Topic: Group Therapy: Avoiding Self-Sabotaging and Enabling Behaviors  Participation Level: Active  Mood: Engaged   Description of Group:  In this group, patients will learn how to identify obstacles, self-sabotaging and enabling behaviors, as well as: what are they, why do we do them and what needs these behaviors meet. Discuss unhealthy relationships and how to have positive healthy boundaries with those that sabotage and enable. Explore aspects of self-sabotage and enabling in yourself and how to limit these self-destructive behaviors in everyday life.   Therapeutic Goals: 1. Patient will identify one obstacle that relates to self-sabotage and enabling behaviors 2. Patient will identify one personal self-sabotaging or enabling behavior they did prior to admission 3. Patient will state a plan to change the above identified behavior 4. Patient will demonstrate ability to communicate their needs through discussion and/or role play.    Summary of Patient Progress:  Patient actively participated in group, sharing personal experiences in navigating complex situations while demonstrating self-restraint, awareness, and effective communication. They were engaged, receptive to feedback, and contributed to fostering a supportive environment that encouraged peers to share and lea    Therapeutic Modalities:  Cognitive Behavioral Therapy Person-Centered Therapy Motivational Interviewing    Lowry Ram, LCSW

## 2023-05-11 NOTE — Plan of Care (Signed)
  Problem: Education: Goal: Mental status will improve Outcome: Progressing Goal: Verbalization of understanding the information provided will improve Outcome: Progressing   Problem: Coping: Goal: Ability to verbalize frustrations and anger appropriately will improve Outcome: Progressing Goal: Ability to demonstrate self-control will improve Outcome: Progressing   Problem: Safety: Goal: Periods of time without injury will increase Outcome: Progressing

## 2023-05-11 NOTE — Progress Notes (Signed)
 [facility] MD Progress Note  05/11/2023 10:04 PM Louis Newton  MRN:  [id number] Subjective:  38 year old African American male admitted voluntarily for psychiatric stabilization. He reports no current complaints but states, "My heart is racing when I take the Risperdal," while also noting, "I am sleeping better."Given his report of palpitations with Risperdal, medication side effects will be monitored, and adjustments may be considered if symptoms persist. His mood and anxiety levels will be continuously assessed, and further psychoeducation will be provided to enhance insight and judgment regarding medication compliance. 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 No pertinent surgical history. Family History:  Family History  Problem Relation Age of Onset   Kidney disease Father    Heart disease Maternal Grandmother    Dementia Paternal Grandfather    Epilepsy Brother    Family Psychiatric  History:see above Social History:  Social History   Substance and Sexual Activity  Alcohol Use Yes   Alcohol/week: 0.0 standard drinks of alcohol   Comment: social     Social History   Substance and Sexual Activity  Drug Use No    Social History   Socioeconomic History   Marital status: Single    Spouse name: Not on file   Number of children: 1   Years of education: Not on file   Highest education level: High school graduate  Occupational History   Not on file   Tobacco Use   Smoking status: Never   Smokeless tobacco: Never  Vaping Use   Vaping status: Never Used  Substance and Sexual Activity   Alcohol use: Yes    Alcohol/week: 0.0 standard drinks of alcohol    Comment: social   Drug use: No   Sexual activity: Yes    Birth control/protection: None  Other Topics Concern   Not on file  Social History Narrative   Not on file   Social Drivers of Health   Financial Resource Strain: Not on file  Food Insecurity: No Food Insecurity (05/07/2023)   Hunger Vital Sign    Worried About Running Out of Food in the Last Year: Never true    Ran Out of Food in the Last Year: Never true  Transportation Needs: No Transportation Needs (05/07/2023)   PRAPARE - Administrator, Civil Service (Medical): No    Lack of Transportation (Non-Medical): No  Physical Activity: Not on file  Stress: Not on file  Social Connections: Not on file   Additional Social History:                         Sleep: Fair  Appetite:  Good  Current Medications: Current Facility-Administered Medications  Medication Dose Route Frequency Provider Last Rate Last Admin   acetaminophen (TYLENOL) tablet 650 mg  650 mg Oral Q6H PRN Gaylyn Rong C, NP   650 mg at 05/08/23 1329   alum & mag hydroxide-simeth (MAALOX/MYLANTA) 200-200-20 MG/5ML suspension 30 mL  30 mL Oral Q4H PRN Howie Ill, NP  busPIRone (BUSPAR) tablet 7.5 mg  7.5 mg Oral BID Myriam Forehand, NP   7.5 mg at 05/11/23 1737   haloperidol (HALDOL) tablet 5 mg  5 mg Oral TID PRN Howie Ill, NP       And   diphenhydrAMINE (BENADRYL) capsule 50 mg  50 mg Oral TID PRN Howie Ill, NP       haloperidol lactate (HALDOL) injection 5 mg  5 mg Intramuscular TID PRN Howie Ill, NP       And   diphenhydrAMINE (BENADRYL) injection 50 mg  50 mg Intramuscular TID PRN Howie Ill, NP       And   LORazepam (ATIVAN) injection 2 mg  2 mg Intramuscular TID PRN Howie Ill, NP        haloperidol lactate (HALDOL) injection 10 mg  10 mg Intramuscular TID PRN Howie Ill, NP       And   diphenhydrAMINE (BENADRYL) injection 50 mg  50 mg Intramuscular TID PRN Howie Ill, NP       And   LORazepam (ATIVAN) injection 2 mg  2 mg Intramuscular TID PRN Howie Ill, NP       divalproex (DEPAKOTE) DR tablet 125 mg  125 mg Oral Q12H Myriam Forehand, NP   125 mg at 05/11/23 2124   hydrOXYzine (ATARAX) tablet 25 mg  25 mg Oral TID PRN Howie Ill, NP   25 mg at 05/10/23 0028   magnesium hydroxide (MILK OF MAGNESIA) suspension 30 mL  30 mL Oral Daily PRN Howie Ill, NP       melatonin tablet 5 mg  5 mg Oral QHS Myriam Forehand, NP   5 mg at 05/11/23 2124   [START ON 05/12/2023] risperiDONE (RISPERDAL) tablet 1 mg  1 mg Oral BID Myriam Forehand, NP       traZODone (DESYREL) tablet 50 mg  50 mg Oral QHS PRN Howie Ill, NP   50 mg at 05/10/23 2131    Lab Results:  Results for orders placed or performed during the hospital encounter of 05/07/23 (from the past 48 hours)  Lipid panel     Status: Abnormal   Collection Time: 05/11/23  7:09 AM  Result Value Ref Range   Cholesterol 139 0 - 200 mg/dL   Triglycerides 54 <725 mg/dL   HDL 37 (L) >36 mg/dL   Total CHOL/HDL Ratio 3.8 RATIO   VLDL 11 0 - 40 mg/dL   LDL Cholesterol 91 0 - 99 mg/dL    Comment:        Total Cholesterol/HDL:CHD Risk Coronary Heart Disease Risk Table                     Men   Women  1/2 Average Risk   3.4   3.3  Average Risk       5.0   4.4  2 X Average Risk   9.6   7.1  3 X Average Risk  23.4   11.0        Use the calculated Patient Ratio above and the CHD Risk Table to determine the patient's CHD Risk.        ATP III CLASSIFICATION (LDL):  <100     mg/dL   Optimal  644-034  mg/dL   Near or Above                    Optimal  130-159  mg/dL   Borderline  161-096  mg/dL   High  >045     mg/dL   Very High Performed at [facility], [address] [place], [place] [address]    Hemoglobin A1c     Status: None   Collection Time: 05/11/23  7:09 AM  Result Value Ref Range   Hgb A1c MFr Bld 5.2 4.8 - 5.6 %    Comment: (NOTE) Pre diabetes:          5.7%-6.4%  Diabetes:              >6.4%  Glycemic control for   <7.0% adults with diabetes    Mean Plasma Glucose 102.54 mg/dL    Comment: Performed at [facility], [address], [place], [place] [address]    Blood Alcohol level:  Lab Results  Component Value Date   ETH <10 05/07/2023   ETH <10 04/13/2017    Metabolic Disorder Labs: Lab Results  Component Value Date   HGBA1C 5.2 05/11/2023   MPG 102.54 05/11/2023   MPG 99.67 05/07/2023   Lab Results  Component Value Date   PROLACTIN 18.4 (H) 09/11/2015   Lab Results  Component Value Date   CHOL 139 05/11/2023   TRIG 54 05/11/2023   HDL 37 (L) 05/11/2023   CHOLHDL 3.8 05/11/2023   VLDL 11 05/11/2023   LDLCALC 91 05/11/2023   LDLCALC 98 05/07/2023    Physical Findings: AIMS:  , ,  ,  ,    CIWA:    COWS:     Musculoskeletal: Strength & Muscle Tone: within normal limits Gait & Station: normal Patient leans: N/A  Psychiatric Specialty Exam:  Presentation  General Appearance:  Appropriate for Environment; Neat  Eye Contact: Good  Speech: Clear and Coherent; Normal Rate  Speech Volume: Decreased  Handedness:Right   Mood and Affect  Mood: Anxious (Pleasant)  Affect: Appropriate; Congruent   Thought Process  Thought Processes: Goal Directed; Coherent  Descriptions of Associations:Intact  Orientation:Full (Time, Place and Person)  Thought Content:Logical  History of Schizophrenia/Schizoaffective disorder:No  Duration of Psychotic Symptoms:-- (none reported)  Hallucinations:Hallucinations: None Description of Auditory Hallucinations: denies  Ideas of Reference:None  Suicidal Thoughts:Suicidal Thoughts: No  Homicidal Thoughts:Homicidal Thoughts: No   Sensorium  Memory: Immediate Good; Recent  Good; Remote Good  Judgment: Fair  Insight: Fair   Art therapist  Concentration: Fair  Attention Span: Fair  Recall: Good  Fund of Knowledge: Good  Language: Good   Psychomotor Activity  Psychomotor Activity: Psychomotor Activity: Normal   Assets  Assets: Desire for Improvement; Communication Skills; Social Support; Housing   Sleep  Sleep: Sleep: Good Number of Hours of Sleep: 6    Physical Exam: Physical Exam Vitals and nursing note reviewed.  Constitutional:      Appearance: Normal appearance.  HENT:     Head: Normocephalic and atraumatic.     Nose: Nose normal.  Pulmonary:     Effort: Pulmonary effort is normal.  Musculoskeletal:        General: Normal range of motion.     Cervical back: Normal range of motion.  Neurological:     General: No focal deficit present.     Mental Status: He is alert and oriented to person, place, and time. Mental status is at baseline.  Psychiatric:        Attention and Perception: Attention and perception normal.        Mood and Affect: Affect normal. Mood is  anxious.        Behavior: Behavior is cooperative.        Thought Content: Thought content normal.        Cognition and Memory: Cognition and memory normal.    Review of Systems  Psychiatric/Behavioral:  The patient is nervous/anxious and has insomnia.   All other systems reviewed and are negative.  Blood pressure 128/69, pulse 67, temperature 98.4 F (36.9 C), resp. rate (!) 22, height 5\' 9"  (1.753 m), weight 67.6 kg, SpO2 100%. Body mass index is 22 kg/m.   Treatment Plan Summary: Risperdal (Risperidone) 1 mg BID for psychotic symptoms. Depakote (Valproate) 125 mg BID for mood stabilization.  Buspar (Buspirone) 7.5 mg BID for generalized anxiety Melatonin 5 mg for sleep aid Monitor the patient every 15 minutes due to medication adjustments and risk assessment. Assess for side effects related to Depakote (GI distress, sedation,  tremors). Monitor for continued insomnia and adjust medications accordingly.  Evaluate response to Buspar and adjust dose if needed for optimal anxiety control  Myriam Forehand, NP 05/11/2023, 10:04 PM

## 2023-05-11 NOTE — Group Note (Signed)
 Date:  05/11/2023 Time:  11:15 PM  Group Topic/Focus:  Wrap-Up Group:   The focus of this group is to help patients review their daily goal of treatment and discuss progress on daily workbooks.    Participation Level:  Active  Participation Quality:  Appropriate  Affect:  Appropriate  Cognitive:  Appropriate  Insight: Appropriate  Engagement in Group:  Engaged  Modes of Intervention:  Discussion   Louis Newton 05/11/2023, 11:15 PM

## 2023-05-11 NOTE — Progress Notes (Signed)
   05/10/23 2200  Psych Admission Type (Psych Patients Only)  Admission Status Voluntary  Psychosocial Assessment  Patient Complaints Worrying  Eye Contact Fair  Facial Expression Flat  Affect Appropriate to circumstance  Speech Logical/coherent  Interaction Assertive  Motor Activity Slow  Appearance/Hygiene In scrubs  Behavior Characteristics Cooperative;Calm  Mood Pleasant  Aggressive Behavior  Effect No apparent injury  Thought Process  Coherency WDL  Content WDL  Delusions None reported or observed  Perception WDL  Hallucination None reported or observed  Judgment WDL  Confusion None  Danger to Self  Current suicidal ideation? Denies  Agreement Not to Harm Self Yes  Description of Agreement Verbal  Danger to Others  Danger to Others None reported or observed

## 2023-05-11 NOTE — Group Note (Unsigned)
 Date:  05/11/2023 Time:  10:55 PM  Group Topic/Focus:  Wrap-Up Group:   The focus of this group is to help patients review their daily goal of treatment and discuss progress on daily workbooks.     Participation Level:  {BHH PARTICIPATION ZOXWR:60454}  Participation Quality:  {BHH PARTICIPATION QUALITY:22265}  Affect:  {BHH AFFECT:22266}  Cognitive:  {BHH COGNITIVE:22267}  Insight: {BHH Insight2:20797}  Engagement in Group:  {BHH ENGAGEMENT IN UJWJX:91478}  Modes of Intervention:  {BHH MODES OF INTERVENTION:22269}  Additional Comments:  ***  Lenore Cordia 05/11/2023, 10:55 PM

## 2023-05-11 NOTE — Plan of Care (Signed)
  Problem: Coping: Goal: Ability to demonstrate self-control will improve Outcome: Progressing   Problem: Education: Goal: Verbalization of understanding the information provided will improve Outcome: Progressing   Problem: Education: Goal: Mental status will improve Outcome: Progressing   Problem: Health Behavior/Discharge Planning: Goal: Identification of resources available to assist in meeting health care needs will improve Outcome: Progressing

## 2023-05-12 DIAGNOSIS — F319 Bipolar disorder, unspecified: Secondary | ICD-10-CM | POA: Diagnosis not present

## 2023-05-12 MED ORDER — RISPERIDONE 1 MG PO TABS: 2.0000 mg | ORAL_TABLET | Freq: Every day | ORAL | 0 refills | Status: DC

## 2023-05-12 MED ORDER — DIVALPROEX SODIUM 125 MG PO DR TAB: 250.0000 mg | DELAYED_RELEASE_TABLET | Freq: Two times a day (BID) | ORAL | 0 refills | Status: DC

## 2023-05-12 MED ORDER — TRAZODONE HCL 50 MG PO TABS: 50.0000 mg | ORAL_TABLET | Freq: Every evening | ORAL | 0 refills | Status: DC | PRN

## 2023-05-12 MED ORDER — BUSPIRONE HCL 7.5 MG PO TABS: 7.5000 mg | ORAL_TABLET | Freq: Two times a day (BID) | ORAL | 0 refills | Status: AC

## 2023-05-12 MED ORDER — HYDROXYZINE HCL 25 MG PO TABS: 25.0000 mg | ORAL_TABLET | Freq: Three times a day (TID) | ORAL | 0 refills | Status: AC | PRN

## 2023-05-12 NOTE — Group Note (Signed)
 [facility] LCSW Group Therapy Note   Group Date: 05/12/2023 Start Time: 1300 End Time: 1400   Type of Therapy/Topic:  Group Therapy:  Emotion Regulation  Participation Level:  Active   Mood:  Description of Group:    The purpose of this group is to assist patients in learning to regulate negative emotions and experience positive emotions. Patients will be guided to discuss ways in which they have been vulnerable to their negative emotions. These vulnerabilities will be juxtaposed with experiences of positive emotions or situations, and patients challenged to use positive emotions to combat negative ones. Special emphasis will be placed on coping with negative emotions in conflict situations, and patients will process healthy conflict resolution skills.  Therapeutic Goals: Patient will identify two positive emotions or experiences to reflect on in order to balance out negative emotions:  Patient will label two or more emotions that they find the most difficult to experience:  Patient will be able to demonstrate positive conflict resolution skills through discussion or role plays:   Summary of Patient Progress: Patient was present in group.  Patient was engaged in group discussion.  Patient was appropriate throughout group.  Patient was able to discuss his emotions and how they have negatively/positively impacted him.  Patient showed fair insight.  Patient was appropriate and supportive.     Therapeutic Modalities:   Cognitive Behavioral Therapy Feelings Identification Dialectical Behavioral Therapy   Harden Mo, LCSW

## 2023-05-12 NOTE — Group Note (Signed)
 Date:  05/12/2023 Time:  10:03 PM  Group Topic/Focus:  Personal Choices and Values:   The focus of this group is to help patients assess and explore the importance of values in their lives, how their values affect their decisions, how they express their values and what opposes their expression.    Participation Level:  Active  Participation Quality:  Appropriate, Attentive, Sharing, and Supportive  Affect:  Appropriate  Cognitive:  Appropriate  Insight: Appropriate  Engagement in Group:  Engaged and Supportive  Modes of Intervention:  Discussion and Support  Additional Comments:     [name] 05/12/2023, 10:03 PM

## 2023-05-12 NOTE — Progress Notes (Signed)
 [facility] MD Progress Note  05/12/2023 6:10 PM Louis Newton  MRN:  [id number] Subjective:   38 year old African American male admitted for psychiatric stabilization. Since admission, he has demonstrated progress in symptom management, medication adherence, and social engagement. He reports feeling much better and states, "I am ready to go home." The patient is participating in group therapy, interacting appropriately with both staff and peers, and engaging in discharge planning discussions. He reports improved sleep with the current medication regimen and denies suicidal ideation, homicidal ideation, auditory/visual hallucinations, or paranoid thoughts. His affect is appropriate, and mood appears stable. He maintains coherent, goal-directed thought processes, and insight into his condition has improved. No signs of agitation, anxiety, or psychotic symptoms observed at this time.The patient verbalizes understanding of the discharge plan and crisis protocol. Given his stabilized condition, symptom resolution, and improved sleep, he is deemed appropriate for discharge with a structured outpatient treatment plan in place. 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 No pertinent surgical history. Family History:  Family History  Problem Relation Age of Onset   Kidney disease Father    Heart disease Maternal Grandmother    Dementia Paternal Grandfather    Epilepsy Brother    Family Psychiatric  History: see above Social History:  Social History   Substance and Sexual Activity  Alcohol Use Yes   Alcohol/week: 0.0 standard  drinks of alcohol   Comment: social     Social History   Substance and Sexual Activity  Drug Use No    Social History   Socioeconomic History   Marital status: Single    Spouse name: Not on file   Number of children: 1   Years of education: Not on file   Highest education level: High school graduate  Occupational History   Not on file  Tobacco Use   Smoking status: Never   Smokeless tobacco: Never  Vaping Use   Vaping status: Never Used  Substance and Sexual Activity   Alcohol use: Yes    Alcohol/week: 0.0 standard drinks of alcohol    Comment: social   Drug use: No   Sexual activity: Yes    Birth control/protection: None  Other Topics Concern   Not on file  Social History Narrative   Not on file   Social Drivers of Health   Financial Resource Strain: Not on file  Food Insecurity: No Food Insecurity (05/07/2023)   Hunger Vital Sign    Worried About Running Out of Food in the Last Year: Never true    Ran Out of Food in the Last Year: Never true  Transportation Needs: No Transportation Needs (05/07/2023)   PRAPARE - Administrator, Civil Service (Medical): No    Lack of Transportation (Non-Medical): No  Physical Activity: Not on file  Stress: Not on file  Social Connections: Not on file   Additional Social History:                         Sleep: Good  Appetite:  Good  Current Medications: Current Facility-Administered Medications  Medication Dose Route Frequency Provider Last Rate Last  Admin   acetaminophen (TYLENOL) tablet 650 mg  650 mg Oral Q6H PRN Howie Ill, NP   650 mg at 05/08/23 1329   alum & mag hydroxide-simeth (MAALOX/MYLANTA) 200-200-20 MG/5ML suspension 30 mL  30 mL Oral Q4H PRN Howie Ill, NP       busPIRone (BUSPAR) tablet 7.5 mg  7.5 mg Oral BID Myriam Forehand, NP   7.5 mg at 05/12/23 1743   haloperidol (HALDOL) tablet 5 mg  5 mg Oral TID PRN Howie Ill, NP       And   diphenhydrAMINE (BENADRYL) capsule 50  mg  50 mg Oral TID PRN Howie Ill, NP       haloperidol lactate (HALDOL) injection 5 mg  5 mg Intramuscular TID PRN Howie Ill, NP       And   diphenhydrAMINE (BENADRYL) injection 50 mg  50 mg Intramuscular TID PRN Howie Ill, NP       And   LORazepam (ATIVAN) injection 2 mg  2 mg Intramuscular TID PRN Howie Ill, NP       haloperidol lactate (HALDOL) injection 10 mg  10 mg Intramuscular TID PRN Howie Ill, NP       And   diphenhydrAMINE (BENADRYL) injection 50 mg  50 mg Intramuscular TID PRN Howie Ill, NP       And   LORazepam (ATIVAN) injection 2 mg  2 mg Intramuscular TID PRN Howie Ill, NP       divalproex (DEPAKOTE) DR tablet 125 mg  125 mg Oral Q12H Myriam Forehand, NP   125 mg at 05/12/23 9629   hydrOXYzine (ATARAX) tablet 25 mg  25 mg Oral TID PRN Howie Ill, NP   25 mg at 05/10/23 0028   magnesium hydroxide (MILK OF MAGNESIA) suspension 30 mL  30 mL Oral Daily PRN Howie Ill, NP       melatonin tablet 5 mg  5 mg Oral QHS Myriam Forehand, NP   5 mg at 05/11/23 2124   risperiDONE (RISPERDAL) tablet 1 mg  1 mg Oral BID Myriam Forehand, NP   1 mg at 05/12/23 1743   traZODone (DESYREL) tablet 50 mg  50 mg Oral QHS PRN Howie Ill, NP   50 mg at 05/10/23 2131    Lab Results:  Results for orders placed or performed during the hospital encounter of 05/07/23 (from the past 48 hours)  Lipid panel     Status: Abnormal   Collection Time: 05/11/23  7:09 AM  Result Value Ref Range   Cholesterol 139 0 - 200 mg/dL   Triglycerides 54 <528 mg/dL   HDL 37 (L) >41 mg/dL   Total CHOL/HDL Ratio 3.8 RATIO   VLDL 11 0 - 40 mg/dL   LDL Cholesterol 91 0 - 99 mg/dL    Comment:        Total Cholesterol/HDL:CHD Risk Coronary Heart Disease Risk Table                     Men   Women  1/2 Average Risk   3.4   3.3  Average Risk       5.0   4.4  2 X Average Risk   9.6   7.1  3 X Average Risk  23.4   11.0        Use the calculated Patient Ratio above and the  CHD Risk Table to determine the  patient's CHD Risk.        ATP III CLASSIFICATION (LDL):  <100     mg/dL   Optimal  657-846  mg/dL   Near or Above                    Optimal  130-159  mg/dL   Borderline  962-952  mg/dL   High  >841     mg/dL   Very High Performed at [facility], [address] [place], [place] [address]   Hemoglobin A1c     Status: None   Collection Time: 05/11/23  7:09 AM  Result Value Ref Range   Hgb A1c MFr Bld 5.2 4.8 - 5.6 %    Comment: (NOTE) Pre diabetes:          5.7%-6.4%  Diabetes:              >6.4%  Glycemic control for   <7.0% adults with diabetes    Mean Plasma Glucose 102.54 mg/dL    Comment: Performed at [facility], [address] [place], [place] [address]    Blood Alcohol level:  Lab Results  Component Value Date   ETH <10 05/07/2023   ETH <10 04/13/2017    Metabolic Disorder Labs: Lab Results  Component Value Date   HGBA1C 5.2 05/11/2023   MPG 102.54 05/11/2023   MPG 99.67 05/07/2023   Lab Results  Component Value Date   PROLACTIN 18.4 (H) 09/11/2015   Lab Results  Component Value Date   CHOL 139 05/11/2023   TRIG 54 05/11/2023   HDL 37 (L) 05/11/2023   CHOLHDL 3.8 05/11/2023   VLDL 11 05/11/2023   LDLCALC 91 05/11/2023   LDLCALC 98 05/07/2023    Physical Findings: AIMS:  , ,  ,  ,    CIWA:    COWS:     Musculoskeletal: Strength & Muscle Tone: within normal limits Gait & Station: normal Patient leans: N/A  Psychiatric Specialty Exam:  Presentation  General Appearance:  Meticulous; Appropriate for Environment (Cooperative, engaged, and interactive with staff and peers.  Participating in group therapy and discharge planning.)  Eye Contact: Good  Speech: Clear and Coherent; Normal Rate  Speech Volume: Normal  Handedness: Right   Mood and Affect  Mood: Euthymic (Reports feeling much better, stating "I am ready to go home.")  Affect: Full Range; Appropriate;  Congruent   Thought Process  Thought Processes: Goal Directed  Descriptions of Associations:Intact  Orientation:Full (Time, Place and Person)  Thought Content:WDL  History of Schizophrenia/Schizoaffective disorder:No  Duration of Psychotic Symptoms:-- (none reported)  Hallucinations:Hallucinations: None Description of Auditory Hallucinations: denies  Ideas of Reference:None  Suicidal Thoughts:Suicidal Thoughts: No  Homicidal Thoughts:Homicidal Thoughts: No   Sensorium  Memory: Immediate Good; Recent Good; Remote Good  Judgment: Good (understands discharge plan and importance of continued outpatient care.  Able to articulate appropriate coping strategies and crisis response plan.)  Insight: Good (Improved insight into mental health condition and need for continued treatment. Acknowledges benefits of medication and therap)   Executive Functions  Concentration: Fair  Attention Span: Fair  Recall: Good  Fund of Knowledge: Good  Language: Good   Psychomotor Activity  Psychomotor Activity: Psychomotor Activity: Normal   Assets  Assets: Communication Skills; Resilience; Social Support; Desire for Improvement; Financial Resources/Insurance   Sleep  Sleep: Sleep: Good Number of Hours of Sleep: 6    Physical Exam: Physical Exam Vitals and nursing note reviewed.  Constitutional:  Appearance: Normal appearance.  HENT:     Head: Normocephalic and atraumatic.     Nose: Nose normal.  Pulmonary:     Effort: Pulmonary effort is normal.  Musculoskeletal:        General: Normal range of motion.     Cervical back: Normal range of motion.  Neurological:     General: No focal deficit present.     Mental Status: He is alert and oriented to person, place, and time. Mental status is at baseline.  Psychiatric:        Attention and Perception: Attention and perception normal.        Mood and Affect: Mood and affect normal.        Speech: Speech  normal.        Behavior: Behavior normal. Behavior is cooperative.        Thought Content: Thought content normal.        Cognition and Memory: Cognition and memory normal.        Judgment: Judgment is impulsive.    Review of Systems  Psychiatric/Behavioral:  The patient is nervous/anxious.   All other systems reviewed and are negative.  Blood pressure 135/75, pulse 76, temperature (!) 97.3 F (36.3 C), resp. rate 20, height 5\' 9"  (1.753 m), weight 67.6 kg, SpO2 100%. Body mass index is 22 kg/m.   Treatment Plan Summary: Daily contact with patient to assess and evaluate symptoms and progress in treatment and Medication management Risperdal (Risperidone) 1 mg BID for psychotic symptoms. Depakote (Valproate) 125 mg BID for mood stabilization.  Buspar (Buspirone) 7.5 mg BID for generalized anxiety Melatonin 5 mg for sleep aid Monitor the patient every 15 minutes due to medication adjustments and risk assessment. Assess for side effects related to Depakote (GI distress, sedation, tremors). Monitor for continued insomnia and adjust medications accordingly.  Evaluate response to Buspar and adjust dose if needed for optimal anxiety control   Myriam Forehand, NP 05/12/2023, 6:10 PM

## 2023-05-12 NOTE — Plan of Care (Signed)
   Problem: Education: Goal: Emotional status will improve Outcome: Progressing Goal: Mental status will improve Outcome: Progressing Goal: Verbalization of understanding the information provided will improve Outcome: Progressing

## 2023-05-12 NOTE — Progress Notes (Signed)
   05/12/23 0857  Psych Admission Type (Psych Patients Only)  Admission Status Voluntary  Psychosocial Assessment  Patient Complaints None  Eye Contact Fair  Facial Expression Animated  Affect Appropriate to circumstance  Speech Logical/coherent  Interaction Assertive  Motor Activity Other (Comment) (WDL)  Appearance/Hygiene Unremarkable  Behavior Characteristics Appropriate to situation;Cooperative  Mood Pleasant  Thought Process  Coherency WDL  Content WDL  Delusions None reported or observed  Perception WDL  Hallucination None reported or observed  Judgment Impaired  Confusion None  Danger to Self  Current suicidal ideation? Denies  Agreement Not to Harm Self Yes  Description of Agreement Verbal  Danger to Others  Danger to Others None reported or observed

## 2023-05-12 NOTE — Group Note (Signed)
 Date:  05/12/2023 Time:  5:11 PM  Group Topic/Focus:  Activity Group: The focus of the group is to promote activity for the patients and encourage them to go outside to the courtyard for some fresh air and exercise.    Participation Level:  Active  Participation Quality:  Appropriate  Affect:  Appropriate  Cognitive:  Appropriate  Insight: Appropriate  Engagement in Group:  Engaged  Modes of Intervention:  Activity  Additional Comments:    Louis Newton Louis Newton 05/12/2023, 5:11 PM

## 2023-05-13 DIAGNOSIS — G4700 Insomnia, unspecified: Secondary | ICD-10-CM | POA: Insufficient documentation

## 2023-05-13 DIAGNOSIS — F319 Bipolar disorder, unspecified: Secondary | ICD-10-CM | POA: Diagnosis not present

## 2023-05-13 NOTE — Progress Notes (Signed)
 Patient denies SI/HI/AVH at this time. Discharge instructions, AVS, prescriptions, and transition record reviewed with patient. Patient agrees to comply with medication management, follow-up visit, and outpatient therapy. Patient belongings returned to patient. Patient questions and concerns addressed and answered. Patient ambulatory off unit. @ 1145.Patient discharged to self, escorted by family member. "

## 2023-05-13 NOTE — Discharge Summary (Addendum)
 Physician Discharge Summary Note  Patient:  Louis Newton is an 38 y.o., male MRN:  [id number] DOB:  09/22/1985 Patient phone:  [phone] (home)  Patient address:   [address] [place] [address],  Total Time spent with patient: 2 hours  Date of Admission:  05/07/2023 Date of Discharge: 05/13/2023  Reason for Admission:  38 yo African American male with a history of bipolar disorder, anxiety, and brief psychosis, was admitted voluntarily due to worsening psychiatric symptoms, including insomnia, increased anxiety, depression, crying spells, irritability, and reckless behavior. He reported significant stress, overthinking, reduced appetite, and feeling "wound up." He endorsed auditory hallucinations at night, describing voices that blame him and make him feel as though he has done something wrong. Additionally, he expressed paranoia about being targeted by a [employer] surveyor, citing multiple traffic stops by the same officer.The patient's symptoms were exacerbated by a recent separation from the mother of his child, who he reported was now involved with another man. He endorsed anhedonia, mood instability, difficulty concentrating, and impaired sleep. Given his history of mood dysregulation, increased energy, and impulsivity consistent with bipolar disorder, along with reports of hallucinations and paranoia, he was admitted for psychiatric stabilization, medication management, and evaluation of psychotic symptoms.  Principal Problem: Bipolar 1 disorder 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 No pertinent surgical history. Family History:  Family History   Problem Relation Age of Onset   Kidney disease Father    Heart disease Maternal Grandmother    Dementia Paternal Grandfather    Epilepsy Brother    Family Psychiatric  History: see above Social History:  Social History   Substance and Sexual Activity  Alcohol Use Yes   Alcohol/week: 0.0 standard drinks of alcohol   Comment: social     Social History   Substance and Sexual Activity  Drug Use No    Social History   Socioeconomic History   Marital status: Single    Spouse name: Not on file   Number of children: 1   Years of education: Not on file   Highest education level: High school graduate  Occupational History   Not on file  Tobacco Use   Smoking status: Never   Smokeless tobacco: Never  Vaping Use   Vaping status: Never Used  Substance and Sexual Activity   Alcohol use: Yes    Alcohol/week: 0.0 standard drinks of alcohol    Comment: social   Drug use: No   Sexual activity: Yes    Birth control/protection: None  Other Topics Concern   Not on file  Social History Narrative   Not on file   Social Drivers of Health   Financial Resource Strain: Not on file  Food Insecurity: No Food Insecurity (05/07/2023)   Hunger Vital Sign    Worried About Running Out of Food in the Last Year: Never true    Ran Out of Food in the Last Year: Never true  Transportation Needs: No Transportation Needs (05/07/2023)   PRAPARE - Administrator, Civil Service (Medical): No    Lack of Transportation (Non-Medical): No  Physical Activity: Not on file  Stress: Not on file  Social  Connections: Not on file    Hospital Course:   The patient was monitored closely for mood instability, psychotic symptoms, and anxiety. Upon admission, he presented with increased anxiety, insomnia, auditory hallucinations, and paranoia. His mood was labile, and he reported significant distress related to recent life stressors, including a separation from the mother of his child.The treatment  team initiated medication adjustments to address his mood dysregulation, anxiety, and psychotic symptoms. Risperdal (Risperidone) was increased to 1 mg BID to target psychotic features and mood stabilization. Depakote (Valproate) 125 mg BID was initiated for mood stabilization, and Buspar (Buspirone) 7.5 mg BID was added for generalized anxiety. Trazodone 50 mg QHS and Melatonin 5 mg QHS were continued to assist with insomnia, while Hydroxyzine 25 mg TID PRN was maintained for anxiety.The patient engaged with staff and peers appropriately during his stay and demonstrated improvement in anxiety and mood stability as medications were titrated. He continued to report auditory hallucinations but acknowledged uncertainty regarding their nature. His sleep gradually improved with medication adjustments.Throughout his admission, the patient remained compliant with medications and denied suicidal or homicidal ideation. He was interactive and engaged in treatment discussions, demonstrating an understanding of the importance of medication adherence and therapy for long-term stability.The patient met the maximum hospitalization criteria and was deemed psychiatrically stable for discharge. At the time of discharge, he denied active hallucinations, paranoia, or significant distress. His anxiety had improved to a manageable level, and he expressed motivation to continue outpatient treatment. He was discharged with follow-up scheduled at [facility] for ongoing medication management and therapy.  Physical Findings: AIMS: Facial and Oral Movements Muscles of Facial Expression: None Lips and Perioral Area: None Jaw: None Tongue: None,Extremity Movements Upper (arms, wrists, hands, fingers): None Lower (legs, knees, ankles, toes): None, Trunk Movements Neck, shoulders, hips: None, Global Judgements Severity of abnormal movements overall : None Incapacitation due to abnormal movements: None Patient's  awareness of abnormal movements: No Awareness, Dental Status Current problems with teeth and/or dentures?: No Does patient usually wear dentures?: No Edentia?: No  CIWA:    COWS:     Musculoskeletal: Strength & Muscle Tone: within normal limits Gait & Station: normal Patient leans: N/A   Psychiatric Specialty Exam:  Presentation  General Appearance:  Meticulous; Neat (Interactive, cooperative, and engaged during the evaluation.)  Eye Contact: Good  Speech: Clear and Coherent; Normal Rate  Speech Volume: Normal  Handedness: Right   Mood and Affect  Mood: Euthymic  Affect: Appropriate; Flat   Thought Process  Thought Processes: Coherent; Goal Directed; Linear  Descriptions of Associations:Intact  Orientation:Full (Time, Place and Person) (and situation)  Thought Content:Logical  History of Schizophrenia/Schizoaffective disorder:No  Duration of Psychotic Symptoms:-- (resolved)  Hallucinations:Hallucinations: None Description of Auditory Hallucinations: denies  Ideas of Reference:None  Suicidal Thoughts:Suicidal Thoughts: No  Homicidal Thoughts:Homicidal Thoughts: No   Sensorium  Memory: Immediate Good; Recent Good; Remote Good  Judgment: Intact (no current safety concerns or impulsive behaviors.)  Insight: Fair (understands the need for therapy and continuing medication.)   Executive Functions  Concentration: Fair  Attention Span: Fair  Recall: Good  Fund of Knowledge: Good  Language: Good   Psychomotor Activity  Psychomotor Activity: Psychomotor Activity: Normal   Assets  Assets: Communication Skills; Financial Resources/Insurance; Desire for Improvement; Resilience; Social Support; Physical Health   Sleep  Sleep: Sleep: Good Number of Hours of Sleep: 6    Physical Exam: Physical Exam Vitals and nursing note reviewed.  Constitutional:      Appearance: Normal appearance.  HENT:     Head: Normocephalic and  atraumatic.     Nose: Nose normal.  Pulmonary:     Effort: Pulmonary effort is normal.  Musculoskeletal:        General: Normal range of motion.     Cervical back: Normal range of motion.  Neurological:     General: No focal deficit present.     Mental Status: He is alert and oriented to person, place, and time. Mental status is at baseline.  Psychiatric:        Attention and Perception: Attention and perception normal.        Mood and Affect: Mood and affect normal.        Speech: Speech normal.        Behavior: Behavior normal. Behavior is cooperative.        Thought Content: Thought content normal.        Cognition and Memory: Cognition and memory normal.        Judgment: Judgment is impulsive.    ROS Blood pressure 125/80, pulse 68, temperature 98.1 F (36.7 C), resp. rate 18, height 5\' 9"  (1.753 m), weight 67.6 kg, SpO2 99%. Body mass index is 22 kg/m.   Social History   Tobacco Use  Smoking Status Never  Smokeless Tobacco Never   Tobacco Cessation:  N/A, patient does not currently use tobacco products   Blood Alcohol level:  Lab Results  Component Value Date   ETH <10 05/07/2023   ETH <10 04/13/2017    Metabolic Disorder Labs:  Lab Results  Component Value Date   HGBA1C 5.2 05/11/2023   MPG 102.54 05/11/2023   MPG 99.67 05/07/2023   Lab Results  Component Value Date   PROLACTIN 18.4 (H) 09/11/2015   Lab Results  Component Value Date   CHOL 139 05/11/2023   TRIG 54 05/11/2023   HDL 37 (L) 05/11/2023   CHOLHDL 3.8 05/11/2023   VLDL 11 05/11/2023   LDLCALC 91 05/11/2023   LDLCALC 98 05/07/2023    See Psychiatric Specialty Exam and Suicide Risk Assessment completed by Attending Physician prior to discharge.  Discharge destination:  Home  Is patient on multiple antipsychotic therapies at discharge:  No   Has Patient had three or more failed trials of antipsychotic monotherapy by history:  No  Recommended Plan for Multiple Antipsychotic  Therapies: NA   Allergies as of 05/13/2023       Reactions   Bee Venom Swelling        Medication List     TAKE these medications      Indication  busPIRone 7.5 MG tablet Commonly known as: BUSPAR Take 1 tablet (7.5 mg total) by mouth 2 (two) times daily.  Indication: Anxiety Disorder   divalproex 125 MG DR tablet Commonly known as: DEPAKOTE Take 2 tablets (250 mg total) by mouth every 12 (twelve) hours.  Indication: Manic Phase of Manic-Depression   hydrOXYzine 25 MG tablet Commonly known as: ATARAX Take 1 tablet (25 mg total) by mouth every 8 (eight) hours as needed for anxiety.  Indication: Feeling Anxious   risperiDONE 1 MG tablet Commonly known as: RISPERDAL Take 2 tablets (2 mg total) by mouth daily.  Indication: Hypomanic Episode of Bipolar Disorder   traZODone 50 MG tablet Commonly known as: DESYREL Take 1 tablet (50 mg total) by mouth at bedtime as needed for sleep.  Indication: Major Depressive Disorder        Follow-up Information     [facility]. Go to.  Why: Virtual appointment 03/28 at 8 AM via phone. Contact information: [address]  [address] [place] [address] [phone]                 Follow-up recommendations:  Activity:  as tolerated Diet:  heart healthy  Comments:   Risperdal (Risperidone) 2 mg daily - Continue for psychotic symptoms and mood stabilization. Depakote (Valproate) 125 mg BID -  for mood stabilization. Buspar (Buspirone) 7.5 mg BID - for generalized anxiety. Trazodone 50 mg QHS PRN - Continue for sleep. Hydroxyzine 25 mg TID PRN - Continue for anxiety. Follow-up with [facility] for continued psychiatric medication management and therapy. Encouraged to establish an ongoing therapeutic relationship for support with anxiety, mood stabilization, and medication adherence. National Suicide & Crisis Lifeline: ?? 988 or 1-800-273-TALK (813)524-0950) - Free, confidential support. Crisis Text  Line: ?? Text HELLO to [phone] - Connect with a trained crisis counseling  Signed: Myriam Forehand, NP 05/13/2023, 9:43 AM

## 2023-05-13 NOTE — Plan of Care (Signed)
   Problem: Education: Goal: Knowledge of [facility] Education information/materials will improve Outcome: Progressing Goal: Emotional status will improve Outcome: Progressing Goal: Mental status will improve Outcome: Progressing

## 2023-05-13 NOTE — Progress Notes (Signed)
  [facility] Adult Case Management Discharge Plan :  Will you be returning to the same living situation after discharge:  Yes,  pt plans to return home upon discharge.  At discharge, do you have transportation home?: Yes,  pt support system to provide transportation.  Do you have the ability to pay for your medications: Yes,  [employer].   Release of information consent forms completed and in the chart;  Patient's signature needed at discharge.  Patient to Follow up at:  Follow-up Information     [facility] Go to.   Why: Virtual appointment 03/28 at 8 AM via phone. Contact information: [address]  [address] [place] [address] [phone]                 Next level of care provider has access to [facility] Link:no  Safety Planning and Suicide Prevention discussed: Yes,  SPE completed with his mother, Acie Fredrickson.      Has patient been referred to the Quitline?: Patient does not use tobacco/nicotine products  Patient has been referred for addiction treatment: No known substance use disorder.  Glenis Smoker, LCSW 05/13/2023, 9:25 AM

## 2023-05-13 NOTE — [facility] Suicide Risk Assessment (Addendum)
 [facility] Discharge Suicide Risk Assessment   Principal Problem: Bipolar 1 disorder [facility]) Discharge Diagnoses: Principal Problem:   Bipolar 1 disorder (HCC) Active Problems:   Generalized anxiety disorder   Insomnia   Total Time spent with patient: 2 hours  Musculoskeletal: Strength & Muscle Tone: within normal limits Gait & Station: normal Patient leans: N/A  Psychiatric Specialty Exam  Presentation  General Appearance:  Meticulous; Neat (Interactive, cooperative, and engaged during the evaluation.)  Eye Contact: Good  Speech: Clear and Coherent; Normal Rate  Speech Volume: Normal  Handedness: Right   Mood and Affect  Mood: Euthymic  Duration of Depression Symptoms: Greater than two weeks  Affect: Appropriate; Flat   Thought Process  Thought Processes: Coherent; Goal Directed; Linear  Descriptions of Associations:Intact  Orientation:Full (Time, Place and Person) (and situation)  Thought Content:Logical  History of Schizophrenia/Schizoaffective disorder:No  Duration of Psychotic Symptoms:-- (resolved)  Hallucinations:Hallucinations: None Description of Auditory Hallucinations: denies  Ideas of Reference:None  Suicidal Thoughts:Suicidal Thoughts: No  Homicidal Thoughts:Homicidal Thoughts: No   Sensorium  Memory: Immediate Good; Recent Good; Remote Good  Judgment: Intact (no current safety concerns or impulsive behaviors.)  Insight: Fair (understands the need for therapy and continuing medication.)   Executive Functions  Concentration: Fair  Attention Span: Fair  Recall: Good  Fund of Knowledge: Good  Language: Good   Psychomotor Activity  Psychomotor Activity: Psychomotor Activity: Normal   Assets  Assets: Communication Skills; Financial Resources/Insurance; Desire for Improvement; Resilience; Social Support; Physical Health   Sleep  Sleep: Sleep: Good Number of Hours of Sleep: 6   Physical Exam: Physical  Exam Vitals and nursing note reviewed.  Constitutional:      Appearance: Normal appearance.  HENT:     Head: Normocephalic and atraumatic.     Nose: Nose normal.  Pulmonary:     Effort: Pulmonary effort is normal.  Musculoskeletal:        General: Normal range of motion.     Cervical back: Normal range of motion.  Neurological:     General: No focal deficit present.     Mental Status: He is alert and oriented to person, place, and time. Mental status is at baseline.  Psychiatric:        Attention and Perception: Attention and perception normal.        Mood and Affect: Mood and affect normal.        Speech: Speech normal.        Behavior: Behavior normal. Behavior is cooperative.        Thought Content: Thought content normal.        Cognition and Memory: Cognition and memory normal.        Judgment: Judgment is impulsive.    Review of Systems  Psychiatric/Behavioral:  The patient has insomnia.   All other systems reviewed and are negative.  Blood pressure 125/80, pulse 68, temperature 98.1 F (36.7 C), resp. rate 18, height 5\' 9"  (1.753 m), weight 67.6 kg, SpO2 99%. Body mass index is 22 kg/m.  Mental Status Per Nursing Assessment::   On Admission:  NA  Demographic Factors:  Male and Divorced or widowed  Loss Factors: Loss of significant relationship  Historical Factors: Family history of mental illness or substance abuse and Impulsivity  Risk Reduction Factors:   Responsible for children under 70 years of age, Sense of responsibility to family, Religious beliefs about death, Living with another person, especially a relative, Positive social support, and Positive coping skills or problem solving skills  Continued Clinical Symptoms:  Bipolar Disorder:   Depressive phase  Cognitive Features That Contribute To Risk:  None    Suicide Risk:  Minimal: No identifiable suicidal ideation.  Patients presenting with no risk factors but with morbid ruminations; may be  classified as minimal risk based on the severity of the depressive symptoms   Follow-up Information     [facility] Go to.   Why: Virtual appointment 03/28 at 8 AM via phone. Contact information: [address]  [address] [place] [address] [phone]                 Plan Of Care/Follow-up recommendations:  Activity:  as tolerated Diet:  heart healthy Risperdal (Risperidone) 2 mg daily - Continue for psychotic symptoms and mood stabilization. Depakote (Valproate) 125 mg BID -  for mood stabilization. Buspar (Buspirone) 7.5 mg BID - for generalized anxiety. Trazodone 50 mg QHS PRN - Continue for sleep. Hydroxyzine 25 mg TID PRN - Continue for anxiety. Follow-up with [facility] for continued psychiatric medication management and therapy. Encouraged to establish an ongoing therapeutic relationship for support with anxiety, mood stabilization, and medication adherence. National Suicide & Crisis Lifeline: ?? 988 or 1-800-273-TALK 223 403 0574) - Free, confidential support. Crisis Text Line: ?? Text HELLO to [phone] - Connect with a trained crisis counseling Myriam Forehand, NP 05/13/2023, 9:44 AM

## 2023-05-13 NOTE — Group Note (Signed)
 Date:  05/13/2023 Time:  10:02 AM  Group Topic/Focus:  Rediscovering Joy:   The focus of this group is to explore various ways to relieve stress in a positive manner.    Participation Level:  Active  Participation Quality:  Appropriate  Affect:  Appropriate  Cognitive:  Appropriate  Insight: Appropriate  Engagement in Group:  Engaged  Modes of Intervention:  Activity  Additional Comments:    Mary Sella Isela Stantz 05/13/2023, 10:02 AM

## 2023-05-13 NOTE — Plan of Care (Signed)
  Problem: Education: Goal: Knowledge of  General Education information/materials will improve Outcome: Adequate for Discharge Goal: Emotional status will improve Outcome: Adequate for Discharge Goal: Mental status will improve Outcome: Adequate for Discharge Goal: Verbalization of understanding the information provided will improve Outcome: Adequate for Discharge   Problem: Activity: Goal: Interest or engagement in activities will improve Outcome: Adequate for Discharge Goal: Sleeping patterns will improve Outcome: Adequate for Discharge   Problem: Coping: Goal: Ability to verbalize frustrations and anger appropriately will improve Outcome: Adequate for Discharge Goal: Ability to demonstrate self-control will improve Outcome: Adequate for Discharge   Problem: Safety: Goal: Periods of time without injury will increase Outcome: Adequate for Discharge

## 2023-05-17 ENCOUNTER — Encounter: Payer: Self-pay | Admitting: Family Medicine

## 2023-05-17 ENCOUNTER — Ambulatory Visit (INDEPENDENT_AMBULATORY_CARE_PROVIDER_SITE_OTHER): Payer: Self-pay | Admitting: Family Medicine

## 2023-05-17 VITALS — BP 133/85 | HR 68 | Temp 97.7°F | Resp 16 | Ht 68.75 in | Wt 159.6 lb

## 2023-05-17 DIAGNOSIS — Z7689 Persons encountering health services in other specified circumstances: Secondary | ICD-10-CM

## 2023-05-17 DIAGNOSIS — F319 Bipolar disorder, unspecified: Secondary | ICD-10-CM

## 2023-05-17 DIAGNOSIS — Z1159 Encounter for screening for other viral diseases: Secondary | ICD-10-CM | POA: Diagnosis not present

## 2023-05-17 DIAGNOSIS — F411 Generalized anxiety disorder: Secondary | ICD-10-CM

## 2023-05-17 DIAGNOSIS — F5104 Psychophysiologic insomnia: Secondary | ICD-10-CM

## 2023-05-17 DIAGNOSIS — Z114 Encounter for screening for human immunodeficiency virus [HIV]: Secondary | ICD-10-CM

## 2023-05-17 DIAGNOSIS — Z0000 Encounter for general adult medical examination without abnormal findings: Secondary | ICD-10-CM

## 2023-05-17 NOTE — Progress Notes (Signed)
 New Patient Office Visit  Subjective   Patient ID: Louis Newton, male    DOB: June 06, 1985  Age: 38 y.o. MRN: [id number]  CC:  Chief Complaint  Patient presents with   Establish Care    Here to establish care. Was in hospital due to stress. Has new meds for anxiety and sleep.     HPI Louis Newton is a 38 year old male who presents to establish with [facility] at [facility].   CC: Patient here to establish care  Last PCP: none   Admitted 3/14-3/20 to in-patient BH unit at [facility]  Bipolar disorder: risperidone 1mg  BID, depakote 125mg  BID Anxiety: Buspar 7.5mg  BID & hydroxyzine 25mg  TID PRN  Insomnia: trazodone 50mg  at bedtime & melatonin 5mg  at bedtime-- he doesn't feel like he is fully asleep, still conscious of surroundings  F/u scheduled with [facility] at 3/28 8AM via phone    Feeling okay right now, still not sleeping the best  Denies SI/HI. Reports relationship with his children's mother recently ended due to infidelity.  Has an appetite- eats healthy Exercises daily  Works in [employer]- physically active      05/17/2023    8:17 AM 04/13/2017   11:08 AM  GAD 7 : Generalized Anxiety Score  Nervous, Anxious, on Edge 3 3  Control/stop worrying 3 3  Worry too much - different things 3 3  Trouble relaxing 0 3  Restless 0 3  Easily annoyed or irritable 0 1  Afraid - awful might happen 0 3  Total GAD 7 Score 9 19  Anxiety Difficulty Not difficult at all Extremely difficult       05/17/2023    8:16 AM 09/22/2016   11:02 AM 04/01/2016   12:02 PM  PHQ9 SCORE ONLY  PHQ-9 Total Score 9 0 0    Outpatient Encounter Medications as of 05/17/2023  Medication Sig   busPIRone (BUSPAR) 7.5 MG tablet Take 1 tablet (7.5 mg total) by mouth 2 (two) times daily.   divalproex (DEPAKOTE) 125 MG DR tablet Take 2 tablets (250 mg total) by mouth every 12 (twelve) hours. (Patient taking differently: Take 125 mg by mouth 2 (two) times daily.)    hydrOXYzine (ATARAX) 25 MG tablet Take 1 tablet (25 mg total) by mouth every 8 (eight) hours as needed for anxiety.   risperiDONE (RISPERDAL) 1 MG tablet Take 2 tablets (2 mg total) by mouth daily.   traZODone (DESYREL) 50 MG tablet Take 1 tablet (50 mg total) by mouth at bedtime as needed for sleep.   No facility-administered encounter medications on file as of 05/17/2023.    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 No pertinent surgical history. Family History  Problem Relation Age of Onset   Kidney disease Father    Hypertension Brother    Epilepsy Brother    Heart disease Maternal Grandmother    Dementia Paternal Grandfather  Social History   Socioeconomic History   Marital status: Single    Spouse name: Not on file   Number of children: 1   Years of education: Not on file   Highest education level: High school graduate  Occupational History   Not on file  Tobacco Use   Smoking status: Never    Passive exposure: Never   Smokeless tobacco: Never  Vaping Use   Vaping status: Never Used  Substance and Sexual Activity   Alcohol use: Not Currently   Drug use: No   Sexual activity: Yes    Birth control/protection: None  Other Topics Concern   Not on file  Social History Narrative   1 step daughter    Social Drivers of Corporate investment banker Strain: Not on file  Food Insecurity: No  Food Insecurity (05/07/2023)   Hunger Vital Sign    Worried About Running Out of Food in the Last Year: Never true    Ran Out of Food in the Last Year: Never true  Transportation Needs: No Transportation Needs (05/07/2023)   PRAPARE - Administrator, Civil Service (Medical): No    Lack of Transportation (Non-Medical): No  Physical Activity: Not on file  Stress: Not on file  Social Connections: Not on file  Intimate Partner Violence: Not At Risk (05/07/2023)   Humiliation, Afraid, Rape, and Kick questionnaire    Fear of Current or Ex-Partner: No    Emotionally Abused: No    Physically Abused: No    Sexually Abused: No   Outpatient Medications Prior to Visit  Medication Sig Dispense Refill   busPIRone (BUSPAR) 7.5 MG tablet Take 1 tablet (7.5 mg total) by mouth 2 (two) times daily. 60 tablet 0   divalproex (DEPAKOTE) 125 MG DR tablet Take 2 tablets (250 mg total) by mouth every 12 (twelve) hours. (Patient taking differently: Take 125 mg by mouth 2 (two) times daily.) 120 tablet 0   hydrOXYzine (ATARAX) 25 MG tablet Take 1 tablet (25 mg total) by mouth every 8 (eight) hours as needed for anxiety. 30 tablet 0   risperiDONE (RISPERDAL) 1 MG tablet Take 2 tablets (2 mg total) by mouth daily. 60 tablet 0   traZODone (DESYREL) 50 MG tablet Take 1 tablet (50 mg total) by mouth at bedtime as needed for sleep. 30 tablet 0   No facility-administered medications prior to visit.   Allergies  Allergen Reactions   Bee Venom Swelling    THROAT SWELLING   ROS    Objective  Today's Vitals   05/17/23 0807  BP: 133/85  Pulse: 68  Resp: 16  Temp: 97.7 F (36.5 C)  TempSrc: Oral  SpO2: 98%  Weight: 159 lb 9.6 oz (72.4 kg)  Height: 5' 8.75" (1.746 m)  PainSc: 0-No pain    GENERAL: Well-appearing, in NAD. Well nourished.  SKIN: Pink, warm and dry. No rash, lesion, ulceration, or ecchymoses.  Head: Normocephalic. RESPIRATORY: Chest wall symmetrical. Respirations even and  non-labored. Breath sounds clear to auscultation bilaterally.  CARDIAC: S1, S2 present, regular rate and rhythm without murmur or gallops. Peripheral pulses 2+ bilaterally.  MSK: Muscle tone and strength appropriate for age. Joints w/o tenderness, redness, or swelling.  EXTREMITIES: Without clubbing, cyanosis, or edema.  NEUROLOGIC: No motor or sensory deficits. Steady, even gait. C2-C12 intact.  PSYCH/MENTAL STATUS: Alert, oriented x 3. Cooperative, appropriate mood and affect.     Assessment & Plan:   1. Encounter to establish care (Primary) Patient is  a 72- year-old male who presents today to establish care with primary care at [facility]. Reviewed the past medical history, family history, social history, surgical history, medications and allergies today- updates made as indicated. Patient has concerns today about his overall health.    2. Bipolar 1 disorder (HCC) Patient currently taking Depakote 125mg  twice daily and risperidone 1 mg twice daily.  He has an appointment scheduled with [facility], who will take over medication management.  3. Generalized anxiety disorder GAD-7 completed with a score of 9.  Patient is currently taking BuSpar 7.5 twice daily and hydroxyzine 25 mg every 8 as needed for anxiety.  He reports that he does not like taking medication daily.  He reports he has not used the hydroxyzine, as medication is not helpful.  Advised patient to continue with BuSpar and use hydroxyzine as needed.  He does have a follow-up scheduled [facility] on 05/21/2023.  4. Psychophysiological insomnia Patient currently taking trazodone 50 mg as needed.  He does report an increase in difficulty sleeping and staying asleep, and he reports that he does not feel like he gets deep sleep. Discussed sleep regimen, including non-pharmacological therapies to help improve sleep. Handout provided.  Patient does not wish to increase the trazodone dose at this time.  He  reports that he may increase his melatonin from 5 mg to 10 nightly. He does have a follow-up scheduled [facility] on 05/21/2023.  5. Wellness examination Patient reports he is fasting today.  Will complete annual physical exam labs and update patient with results. Plan for CPE in 4-6 weeks.  - CBC with Differential/Platelet - Comprehensive metabolic panel - Hemoglobin A1c - Lipid panel - TSH Rfx on Abnormal to Free T4  6. Screening for HIV (human immunodeficiency virus) The [place] [employer] (USPSTF) recommends HIV screening for adults age 80-65. Will complete HIV screening with labs today and update patient with results.  - HIV antibody (with reflex)  7. Need for hepatitis C screening test [employer] (USPSTF) recommends hepatitis C screening for adults age 34-79. Will complete hepatitis C screening with labs today and update patient with results.   - Hepatitis C Antibody   Return in about 6 weeks (around 06/28/2023) for Physical.   Alyson Reedy, FNP

## 2023-05-18 ENCOUNTER — Encounter: Payer: Self-pay | Admitting: Family Medicine

## 2023-05-18 LAB — CBC WITH DIFFERENTIAL/PLATELET
Basophils Absolute: 0 10*3/uL (ref 0.0–0.2)
Basos: 1 %
EOS (ABSOLUTE): 0.3 10*3/uL (ref 0.0–0.4)
Eos: 5 %
Hematocrit: 41.9 % (ref 37.5–51.0)
Hemoglobin: 14 g/dL (ref 13.0–17.7)
Immature Grans (Abs): 0.1 10*3/uL (ref 0.0–0.1)
Immature Granulocytes: 1 %
Lymphocytes Absolute: 1.4 10*3/uL (ref 0.7–3.1)
Lymphs: 26 %
MCH: 27.5 pg (ref 26.6–33.0)
MCHC: 33.4 g/dL (ref 31.5–35.7)
MCV: 82 fL (ref 79–97)
Monocytes Absolute: 0.5 10*3/uL (ref 0.1–0.9)
Monocytes: 9 %
Neutrophils Absolute: 3.3 10*3/uL (ref 1.4–7.0)
Neutrophils: 58 %
Platelets: 290 10*3/uL (ref 150–450)
RBC: 5.09 x10E6/uL (ref 4.14–5.80)
RDW: 12 % (ref 11.6–15.4)
WBC: 5.5 10*3/uL (ref 3.4–10.8)

## 2023-05-18 LAB — COMPREHENSIVE METABOLIC PANEL
ALT: 28 IU/L (ref 0–44)
AST: 20 IU/L (ref 0–40)
Albumin: 4.2 g/dL (ref 4.1–5.1)
Alkaline Phosphatase: 68 IU/L (ref 44–121)
BUN/Creatinine Ratio: 5 — ABNORMAL LOW (ref 9–20)
BUN: 6 mg/dL (ref 6–24)
Bilirubin Total: <0.2 mg/dL (ref 0.0–1.2)
CO2: 25 mmol/L (ref 20–29)
Calcium: 9.8 mg/dL (ref 8.7–10.2)
Chloride: 103 mmol/L (ref 96–106)
Creatinine, Ser: 1.1 mg/dL (ref 0.76–1.27)
Globulin, Total: 2.6 g/dL (ref 1.5–4.5)
Glucose: 86 mg/dL (ref 70–99)
Potassium: 4.4 mmol/L (ref 3.5–5.2)
Sodium: 143 mmol/L (ref 134–144)
Total Protein: 6.8 g/dL (ref 6.0–8.5)
eGFR: 87 mL/min/{1.73_m2} (ref >59)

## 2023-05-18 LAB — LIPID PANEL
Chol/HDL Ratio: 2.8 ratio (ref 0.0–5.0)
Cholesterol, Total: 170 mg/dL (ref 100–199)
HDL: 61 mg/dL (ref >39)
LDL Chol Calc (NIH): 93 mg/dL (ref 0–99)
Triglycerides: 86 mg/dL (ref 0–149)
VLDL Cholesterol Cal: 16 mg/dL (ref 5–40)

## 2023-05-18 LAB — HEPATITIS C ANTIBODY: Hep C Virus Ab: NONREACTIVE

## 2023-05-18 LAB — HIV ANTIBODY (ROUTINE TESTING W REFLEX): HIV Screen 4th Generation wRfx: NONREACTIVE

## 2023-05-18 LAB — HEMOGLOBIN A1C
Est. average glucose Bld gHb Est-mCnc: 111 mg/dL
Hgb A1c MFr Bld: 5.5 % (ref 4.8–5.6)

## 2023-05-18 LAB — TSH RFX ON ABNORMAL TO FREE T4: TSH: 2.4 u[IU]/mL (ref 0.450–4.500)

## 2023-06-29 ENCOUNTER — Ambulatory Visit: Admitting: Family Medicine

## 2023-07-05 ENCOUNTER — Ambulatory Visit (INDEPENDENT_AMBULATORY_CARE_PROVIDER_SITE_OTHER): Admitting: Family Medicine

## 2023-07-05 ENCOUNTER — Encounter: Payer: Self-pay | Admitting: Family Medicine

## 2023-07-05 VITALS — BP 129/74 | HR 79 | Resp 16 | Ht 68.75 in | Wt 158.8 lb

## 2023-07-05 DIAGNOSIS — L02512 Cutaneous abscess of left hand: Secondary | ICD-10-CM

## 2023-07-05 DIAGNOSIS — Z0000 Encounter for general adult medical examination without abnormal findings: Secondary | ICD-10-CM | POA: Diagnosis not present

## 2023-07-05 NOTE — Progress Notes (Signed)
 Subjective:   Louis Newton 1985-08-18 07/05/2023  CC: Chief Complaint  Patient presents with   Annual Exam    HPI: Louis Newton is a 38 y.o. male who presents for a routine health maintenance exam.  Fasting labs collected at previous visit.  Diet- healthy  Exercise- daily   HEALTH SCREENINGS: - Vision Screening: plans to schedule  - Dental Visits: up to date - Testicular Exam: Declined - STD Screening: Declined - PSA (50+): Not applicable  - Colonoscopy (45+): Not applicable  Discussed with patient purpose of the colonoscopy is to detect colon cancer at curable precancerous or early stages  - AAA Screening: Not applicable  Men age 64-75 who have ever smoked - Lung Cancer screening with low-dose CT: Not applicable-  Adults age 13-80 who are current cigarette smokers or quit within the last 15 years. Must have 20 pack year history.   Depression and Anxiety Screen done today and results listed below:     07/05/2023    2:54 PM 05/17/2023    8:16 AM 09/22/2016   11:02 AM 04/01/2016   12:02 PM 09/05/2015    3:19 PM  Depression screen PHQ 2/9  Decreased Interest 0 3 0 0 0  Down, Depressed, Hopeless 0 3 0 0 1  PHQ - 2 Score 0 6 0 0 1  Altered sleeping 0 3     Tired, decreased energy 0 0     Change in appetite 0 0     Feeling bad or failure about yourself  0 0     Trouble concentrating 0 0     Moving slowly or fidgety/restless 0 0     Suicidal thoughts 0 0     PHQ-9 Score 0 9     Difficult doing work/chores Not difficult at all Not difficult at all         07/05/2023    2:54 PM 05/17/2023    8:17 AM 04/13/2017   11:08 AM  GAD 7 : Generalized Anxiety Score  Nervous, Anxious, on Edge 0 3 3  Control/stop worrying 0 3 3  Worry too much - different things 0 3 3  Trouble relaxing 0 0 3  Restless 0 0 3  Easily annoyed or irritable 0 0 1  Afraid - awful might happen 0 0 3  Total GAD 7 Score 0 9 19  Anxiety Difficulty Not difficult at all Not difficult at all  Extremely difficult    IMMUNIZATIONS:  - Tdap: Tetanus vaccination status reviewed: last tetanus booster within 10 years. - Influenza: Not applicable  - Pneumovax: Not applicable - Prevnar: Not applicable - Shingrix vaccine (50+): Not applicable  Past medical history, surgical history, medications, allergies, family history and social history reviewed with patient today and changes made to appropriate areas of the chart.   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 No pertinent surgical history.  Current Outpatient Medications on File Prior to Visit  Medication Sig   divalproex  (DEPAKOTE ) 125 MG DR tablet Take 2 tablets (250 mg total) by mouth every 12 (twelve) hours. (Patient taking differently: Take 125 mg by mouth 2 (two) times daily.)   risperiDONE  (RISPERDAL ) 1 MG tablet Take 2 tablets (2 mg total) by mouth daily.   traZODone  (DESYREL ) 50 MG tablet Take 1 tablet (50  mg total) by mouth at bedtime as needed for sleep.   No current facility-administered medications on file prior to visit.    Allergies  Allergen Reactions   Bee Venom Swelling    THROAT SWELLING     Social History   Socioeconomic History   Marital status: Single    Spouse name: Not on file   Number of children: 1   Years of education: Not on file   Highest education level: High school graduate  Occupational History   Not on file  Tobacco Use   Smoking status: Never    Passive exposure: Never   Smokeless tobacco: Never  Vaping Use   Vaping status: Never Used  Substance and Sexual Activity   Alcohol use: Not Currently   Drug use: No   Sexual activity: Yes    Birth control/protection: None  Other Topics Concern   Not on file  Social History Narrative   1 step daughter    Social Drivers of  Corporate investment banker Strain: Not on file  Food Insecurity: No Food Insecurity (05/07/2023)   Hunger Vital Sign    Worried About Running Out of Food in the Last Year: Never true    Ran Out of Food in the Last Year: Never true  Transportation Needs: No Transportation Needs (05/07/2023)   PRAPARE - Administrator, Civil Service (Medical): No    Lack of Transportation (Non-Medical): No  Physical Activity: Not on file  Stress: Not on file  Social Connections: Not on file  Intimate Partner Violence: Not At Risk (05/07/2023)   Humiliation, Afraid, Rape, and Kick questionnaire    Fear of Current or Ex-Partner: No    Emotionally Abused: No    Physically Abused: No    Sexually Abused: No   Social History   Tobacco Use  Smoking Status Never   Passive exposure: Never  Smokeless Tobacco Never   Social History   Substance and Sexual Activity  Alcohol Use Not Currently   Family History  Problem Relation Age of Onset   Kidney disease Father    Hypertension Brother    Epilepsy Brother    Heart disease Maternal Grandmother    Dementia Paternal Grandfather      ROS: Denies fever, fatigue, unexplained weight loss/gain, CP, SHOB, and palpatitations. Denies neurological deficits, gastrointestinal and/or genitourinary complaints, and skin changes.   Objective:   Today's Vitals   07/05/23 1449  BP: 129/74  Pulse: 79  Resp: 16  SpO2: 99%  Weight: 158 lb 12.8 oz (72 kg)  Height: 5' 8.75" (1.746 m)  PainSc: 0-No pain    GENERAL APPEARANCE: Well-appearing, in NAD. Well nourished.  SKIN: Pink, warm and dry. Turgor normal. No rash, lesion, ulceration, or ecchymoses. Hair evenly distributed.  HEENT: HEAD: Normocephalic.  EYES: PERRLA. EOMI. Lids intact w/o defect. Sclera white, Conjunctiva pink w/o exudate.  EARS: External ear w/o redness, swelling, masses or lesions. EAC clear. TM's intact, translucent w/o bulging, appropriate landmarks visualized. Appropriate acuity  to conversational tones.  NOSE: Septum midline w/o deformity. Nares patent, mucosa pink and non-inflamed w/o drainage. No sinus tenderness.  THROAT: Uvula midline. Oropharynx clear. Tonsils non-inflamed w/o exudate. Oral mucosa pink and moist.  NECK: Supple, Trachea midline. Full ROM w/o pain or tenderness. No lymphadenopathy. Thyroid  non-tender w/o enlargement or palpable masses.  RESPIRATORY: Chest wall symmetrical w/o masses. Respirations even and non-labored. Breath sounds clear to auscultation bilaterally. No wheezes, rales, rhonchi, or crackles. CARDIAC: S1, S2 present, regular  rate and rhythm. No gallops, murmurs, rubs, or clicks. PMI w/o lifts, heaves, or thrills. No carotid bruits. Capillary refill <2 seconds. Peripheral pulses 2+ bilaterally. GI: Abdomen soft w/o distention. Normoactive bowel sounds. No palpable masses or tenderness. No guarding or rebound tenderness. Liver and spleen w/o tenderness or enlargement. No CVA tenderness.  GU: Deferred exam. MSK: Muscle tone and strength appropriate for age, w/o atrophy or abnormal movement. EXTREMITIES: Active ROM intact, w/o tenderness, crepitus, or contracture. No obvious joint deformities or effusions. No clubbing, edema, or cyanosis.  NEUROLOGIC: CN's II-XII intact. Motor strength symmetrical with no obvious weakness. No sensory deficits. DTR 2+ symmetric bilaterally. Steady, even gait.  PSYCH/MENTAL STATUS: Alert, oriented x 3. Cooperative, appropriate mood and affect.   Results for orders placed or performed in visit on 05/17/23  CBC with Differential/Platelet   Collection Time: 05/17/23  8:49 AM  Result Value Ref Range   WBC 5.5 3.4 - 10.8 x10E3/uL   RBC 5.09 4.14 - 5.80 x10E6/uL   Hemoglobin 14.0 13.0 - 17.7 g/dL   Hematocrit 16.1 09.6 - 51.0 %   MCV 82 79 - 97 fL   MCH 27.5 26.6 - 33.0 pg   MCHC 33.4 31.5 - 35.7 g/dL   RDW 04.5 40.9 - 81.1 %   Platelets 290 150 - 450 x10E3/uL   Neutrophils 58 Not Estab. %   Lymphs 26 Not  Estab. %   Monocytes 9 Not Estab. %   Eos 5 Not Estab. %   Basos 1 Not Estab. %   Neutrophils Absolute 3.3 1.4 - 7.0 x10E3/uL   Lymphocytes Absolute 1.4 0.7 - 3.1 x10E3/uL   Monocytes Absolute 0.5 0.1 - 0.9 x10E3/uL   EOS (ABSOLUTE) 0.3 0.0 - 0.4 x10E3/uL   Basophils Absolute 0.0 0.0 - 0.2 x10E3/uL   Immature Granulocytes 1 Not Estab. %   Immature Grans (Abs) 0.1 0.0 - 0.1 x10E3/uL  Comprehensive metabolic panel   Collection Time: 05/17/23  8:49 AM  Result Value Ref Range   Glucose 86 70 - 99 mg/dL   BUN 6 6 - 24 mg/dL   Creatinine, Ser 9.14 0.76 - 1.27 mg/dL   eGFR 87 >78 GN/FAO/1.30   BUN/Creatinine Ratio 5 (L) 9 - 20   Sodium 143 134 - 144 mmol/L   Potassium 4.4 3.5 - 5.2 mmol/L   Chloride 103 96 - 106 mmol/L   CO2 25 20 - 29 mmol/L   Calcium 9.8 8.7 - 10.2 mg/dL   Total Protein 6.8 6.0 - 8.5 g/dL   Albumin 4.2 4.1 - 5.1 g/dL   Globulin, Total 2.6 1.5 - 4.5 g/dL   Bilirubin Total <8.6 0.0 - 1.2 mg/dL   Alkaline Phosphatase 68 44 - 121 IU/L   AST 20 0 - 40 IU/L   ALT 28 0 - 44 IU/L  Hemoglobin A1c   Collection Time: 05/17/23  8:49 AM  Result Value Ref Range   Hgb A1c MFr Bld 5.5 4.8 - 5.6 %   Est. average glucose Bld gHb Est-mCnc 111 mg/dL  Lipid panel   Collection Time: 05/17/23  8:49 AM  Result Value Ref Range   Cholesterol, Total 170 100 - 199 mg/dL   Triglycerides 86 0 - 149 mg/dL   HDL 61 >57 mg/dL   VLDL Cholesterol Cal 16 5 - 40 mg/dL   LDL Chol Calc (NIH) 93 0 - 99 mg/dL   Chol/HDL Ratio 2.8 0.0 - 5.0 ratio  TSH Rfx on Abnormal to Free T4   Collection Time:  05/17/23  8:49 AM  Result Value Ref Range   TSH 2.400 0.450 - 4.500 uIU/mL  HIV antibody (with reflex)   Collection Time: 05/17/23  8:49 AM  Result Value Ref Range   HIV Screen 4th Generation wRfx Non Reactive Non Reactive  Hepatitis C Antibody   Collection Time: 05/17/23  8:49 AM  Result Value Ref Range   Hep C Virus Ab Non Reactive Non Reactive    Assessment & Plan:   1. Wellness examination  (Primary) - Encouraged to adjust caloric intake to maintain or achieve ideal body weight, to reduce intake of dietary saturated fat and total fat, to limit sodium intake by avoiding high sodium foods and not adding table salt, and to maintain adequate dietary potassium and calcium preferably from fresh fruits, vegetables, and low-fat dairy products.   - Advised to avoid cigarette smoking. - Discussed with the patient that most people either abstain from alcohol or drink within safe limits (<=14/week and <=4 drinks/occasion for males, <=7/weeks and <= 3 drinks/occasion for females) and that the risk for alcohol disorders and other health effects rises proportionally with the number of drinks per week and how often a drinker exceeds daily limits. - Discussed cessation/primary prevention of drug use and availability of treatment for abuse.  - Stressed the importance of regular exercise - Injury prevention: Discussed safety belts, safety helmets, smoke detector, smoking near bedding or upholstery.  - Dental health: Discussed importance of regular tooth brushing, flossing, and dental visits.  - Sexuality: Discussed sexually transmitted diseases, partner selection, use of condoms, avoidance of unintended pregnancy  and contraceptive alternatives.   NEXT PREVENTATIVE PHYSICAL DUE IN 1 YEAR.  2. Abscess of left middle finger Picture placed in chart. Patient reports he hit his middle finger about a week ago and has noticed an increase in the size of this bump. Non-tender to light palpation. No erythema or drainage present. Referral placed for dermatology.  - Ambulatory referral to Dermatology   Return in about 1 year (around 07/04/2024) for Physical with fasting labs, unless sooner for mood .  Patient to reach out to office if new, worrisome, or unresolved symptoms arise or if no improvement in patient's condition. Patient verbalized understanding and is agreeable to treatment plan. All questions answered  to patient's satisfaction.    Wilhelmena Hanson, FNP

## 2023-07-05 NOTE — Patient Instructions (Signed)
 Health Maintenance Recommendations Screening Testing Mammogram Every 1 -2 years based on history and risk factors Starting at age 40 Pap Smear Ages 21-39 every 3 years Ages 23-65 every 5 years with HPV testing More frequent testing may be required based on results and history Colon Cancer Screening Every 1-10 years based on test performed, risk factors, and history Starting at age 102 Bone Density Screening Every 2-10 years based on history Starting at age 69 for women Recommendations for men differ based on medication usage, history, and risk factors AAA Screening One time ultrasound Men 30-10 years old who have every smoked Lung Cancer Screening Low Dose Lung CT every 12 months Age 20-80 years with a 30 pack-year smoking history who still smoke or who have quit within the last 15 years   Screening Labs Routine  Labs: Complete Blood Count (CBC), Complete Metabolic Panel (CMP), Cholesterol (Lipid Panel) Every 6-12 months based on history and medications May be recommended more frequently based on current conditions or previous results Hemoglobin A1c Lab Every 3-12 months based on history and previous results Starting at age 24 or earlier with diagnosis of diabetes, high cholesterol, BMI >26, and/or risk factors Frequent monitoring for patients with diabetes to ensure blood sugar control Thyroid Panel (TSH w/ T3 & T4) Every 6 months based on history, symptoms, and risk factors May be repeated more often if on medication HIV One time testing for all patients 23 and older May be repeated more frequently for patients with increased risk factors or exposure Hepatitis C One time testing for all patients 47 and older May be repeated more frequently for patients with increased risk factors or exposure Gonorrhea, Chlamydia Every 12 months for all sexually active persons 13-24 years Additional monitoring may be recommended for those who are considered high risk or who have  symptoms PSA Men 72-66 years old with risk factors Additional screening may be recommended from age 2-69 based on risk factors, symptoms, and history   Vaccine Recommendations Tetanus Booster All adults every 10 years Flu Vaccine All patients 6 months and older every year COVID Vaccine All patients 12 years and older Initial dosing with booster May recommend additional booster based on age and health history HPV Vaccine 2 doses all patients age 56-26 Dosing may be considered for patients over 26 Shingles Vaccine (Shingrix) 2 doses all adults 55 years and older Pneumonia (Pneumovax 23) All adults 65 years and older May recommend earlier dosing based on health history Pneumonia (Prevnar 16) All adults 65 years and older Dosed 1 year after Pneumovax 23   Additional Screening, Testing, and Vaccinations may be recommended on an individualized basis based on family history, health history, risk factors, and/or exposure.  __________________________________________________________   Diet Recommendations for All Patients   I recommend that all patients maintain a diet low in saturated fats, carbohydrates, and cholesterol. While this can be challenging at first, it is not impossible and small changes can make big differences.  Things to try: Decreasing the amount of soda, sweet tea, and/or juice to one or less per day and replace with water While water is always the first choice, if you do not like water you may consider adding a water additive without sugar to improve the taste other sugar free drinks Replace potatoes with a brightly colored vegetable at dinner Use healthy oils, such as canola oil or olive oil, instead of butter or hard margarine Limit your bread intake to two pieces or less a day Replace regular pasta with  low carb pasta options Bake, broil, or grill foods instead of frying Monitor portion sizes  Eat smaller, more frequent meals throughout the day instead of large  meals   An important thing to remember is, if you love foods that are not great for your health, you don't have to give them up completely. Instead, allow these foods to be a reward when you have done well. Allowing yourself to still have special treats every once in a while is a nice way to tell yourself thank you for working hard to keep yourself healthy.    Also remember that every day is a new day. If you have a bad day and "fall off the wagon", you can still climb right back up and keep moving along on your journey!   We have resources available to help you!  Some websites that may be helpful include: www.http://www.wall-moore.info/        Www.VeryWellFit.com _____________________________________________________________   Activity Recommendations for All Patients   I recommend that all adults get at least 20 minutes of moderate physical activity that elevates your heart rate at least 5 days out of the week.  Some examples include: Walking or jogging at a pace that allows you to carry on a conversation Cycling (stationary bike or outdoors) Water aerobics Yoga Weight lifting Dancing If physical limitations prevent you from putting stress on your joints, exercise in a pool or seated in a chair are excellent options.   Do determine your MAXIMUM heart rate for activity: YOUR AGE - 220 = MAX HeartRate    Remember! Do not push yourself too hard.  Start slowly and build up your pace, speed, weight, time in exercise, etc.  Allow your body to rest between exercise and get good sleep. You will need more water than normal when you are exerting yourself. Do not wait until you are thirsty to drink. Drink with a purpose of getting in at least 8, 8 ounce glasses of water a day plus more depending on how much you exercise and sweat.      If you begin to develop dizziness, chest pain, abdominal pain, jaw pain, shortness of breath, headache, vision changes, lightheadedness, or other concerning symptoms, stop the  activity and allow your body to rest. If your symptoms are severe, seek emergency evaluation immediately. If your symptoms are concerning, but not severe, please let us know so that we can recommend further evaluation.

## 2023-07-14 ENCOUNTER — Telehealth: Payer: Self-pay

## 2023-07-14 NOTE — Telephone Encounter (Signed)
 Copied from CRM [id number]. Topic: General - Other >> Jul 14, 2023 11:07 AM Emylou G wrote: Reason for CRM: Patient called.. checking status of his swollen finger photo that would be sent to dermatologist?   He said his finger is in discomfort

## 2023-07-14 NOTE — Telephone Encounter (Signed)
 Patient has been provided dermatology office contact information via Mychart.

## 2023-07-20 ENCOUNTER — Encounter: Payer: Self-pay | Admitting: Physician Assistant

## 2023-07-20 ENCOUNTER — Ambulatory Visit (INDEPENDENT_AMBULATORY_CARE_PROVIDER_SITE_OTHER): Admitting: Physician Assistant

## 2023-07-20 VITALS — BP 133/79 | HR 68

## 2023-07-20 DIAGNOSIS — M67442 Ganglion, left hand: Secondary | ICD-10-CM

## 2023-07-20 NOTE — Progress Notes (Signed)
   New Patient Visit   Subjective  Louis Newton is a 38 y.o. male who presents for the following: possible abscess on left middle finger. Duration is less than one month. Patient denies any trauma. Does interfere with his work.   Patient states he  has this lesion on his LEFT MIDDLE FINGER that his PCP thought might be an abscess. He was subsequently referred to dermatology for further evaluation.  Patient reports the areas have been there for 2 weeks. He reports the areas are bothersome. Patient rates irritation 7 out of 10. He states that the areas have not spread. Patient reports he  has not previously been treated for this area.     The following portions of the chart were reviewed this encounter and updated as appropriate: medications, allergies, medical history  Review of Systems:  No other skin or systemic complaints except as noted in HPI or Assessment and Plan.  Objective  Well appearing patient in no apparent distress; mood and affect are within normal limits.   A focused examination was performed of the following areas: Both hands to include the nails.   Relevant exam findings are noted in the Assessment and Plan.     Assessment & Plan  Digital Mucous Cyst Exam: Soft slightly mobile papule (see photo).   Treatment Plan: - Referral sent to [facility] - hand surgeon   DIGITAL MUCINOUS CYST OF FINGER OF LEFT HAND    Return if symptoms worsen or fail to improve.  Exie Holler, CMA, am acting as scribe for Dalbert Stillings K, PA-C.   Documentation: I have reviewed the above documentation for accuracy and completeness, and I agree with the above.  Brett Darko K, PA-C

## 2023-07-20 NOTE — Patient Instructions (Signed)

## 2023-11-10 ENCOUNTER — Ambulatory Visit: Admitting: Family Medicine

## 2023-11-10 ENCOUNTER — Encounter: Payer: Self-pay | Admitting: Family Medicine

## 2023-11-10 VITALS — BP 141/95 | HR 98 | Resp 16 | Ht 68.75 in | Wt 150.0 lb

## 2023-11-10 DIAGNOSIS — F5104 Psychophysiologic insomnia: Secondary | ICD-10-CM | POA: Diagnosis not present

## 2023-11-10 DIAGNOSIS — F319 Bipolar disorder, unspecified: Secondary | ICD-10-CM | POA: Diagnosis not present

## 2023-11-10 MED ORDER — TRAZODONE HCL 50 MG PO TABS: 50.0000 mg | ORAL_TABLET | Freq: Every evening | ORAL | 2 refills | Status: DC | PRN

## 2023-11-10 MED ORDER — FLUOXETINE HCL 10 MG PO CAPS
10.0000 mg | ORAL_CAPSULE | Freq: Every day | ORAL | 2 refills | Status: DC
Start: 2023-11-10 — End: 2023-11-19

## 2023-11-10 NOTE — Progress Notes (Signed)
 Acute Care Office Visit  Subjective:   Louis Newton August 11, 1985 11/10/2023  Chief Complaint  Patient presents with   Anxiety   HPI:  Discussed the use of AI scribe software for clinical note transcription with the patient, who gave verbal consent to proceed.  History of Present Illness    Louis Newton is a 38 year old male who presents with worsening anxiety and depression. He is accompanied by his brother.  He has been experiencing worsening anxiety and depression, feeling 'kind of depressed' and agitated by social media content, particularly related to racial issues. He feels on edge and paranoid, with concerns about people being out to get him. No thoughts of self-harm are present.  He has a history of not adhering to his medication regimen. Previously prescribed Depakote , risperidone , Buspar , and trazodone , he reports not taking them consistently due to side effects like feeling sluggish and 'slow.;' He has not been taking his medications since March 2025.  His brother reports that he watches a lot of independent news, which seems to exacerbate his anxiety. He has had incidents at work where he has said inappropriate things to coworkers, and he has been unable to drive due to his anxiety. He has been working six to seven days a week since May 2025.  He reports sleeping issues, with his brother noting that he only sleeps two to three hours a night. He has been using trazodone  for sleep, but it has not been effective in providing restful sleep. His brother also notes that his mind 'keeps going and going' and he has difficulty shutting it down at night.  He has experienced visual disturbances, describing seeing 'old trucks' and feeling like everything looks like an 'old broken down town'. He has not had any recent episodes of seeing things, but he did experience this yesterday.     The following portions of the patient's history were reviewed and updated as appropriate:  past medical history, past surgical history, family history, social history, allergies, medications, and problem list.   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 No pertinent surgical history. Family History  Problem Relation Age of Onset   Kidney disease Father    Hypertension Brother    Epilepsy Brother    Heart disease Maternal Grandmother    Dementia Paternal Grandfather    Outpatient Medications Prior to Visit  Medication Sig Dispense Refill   busPIRone  (BUSPAR ) 7.5 MG tablet Take 7.5 mg by mouth 3 (three) times daily.     risperiDONE  (RISPERDAL ) 1 MG tablet Take 2 tablets (2 mg total) by mouth daily. 60 tablet 0   traZODone  (DESYREL ) 50 MG tablet Take 1 tablet (50 mg total) by mouth at bedtime as needed for sleep. 30 tablet 0   divalproex  (DEPAKOTE ) 125 MG DR tablet Take 2 tablets (250 mg total) by mouth every 12 (twelve) hours. (Patient not taking: Reported on 11/10/2023) 120 tablet 0   No facility-administered medications prior to visit.   Allergies  Allergen Reactions  Bee Venom Swelling    THROAT SWELLING     ROS: A complete ROS was performed with pertinent positives/negatives noted in the HPI. The remainder of the ROS are negative.    Objective:   Today's Vitals   11/10/23 1355 11/10/23 1407  BP: (!) 141/95   Pulse: 98   Resp: 16   SpO2: 92%   Weight: 150 lb (68 kg)   Height: 5' 8.75 (1.746 m)   PainSc: 0-No pain 0-No pain    GENERAL: Well-appearing, in NAD. Well  nourished.  SKIN: Pink, warm and dry. No rash, lesion, ulceration, or ecchymoses.  Head: Normocephalic. NECK: Trachea midline. Full ROM w/o pain or tenderness. No lymphadenopathy.  EARS: Tympanic membranes are intact, translucent without bulging and without drainage. Appropriate landmarks visualized.  EYES: Conjunctiva clear without exudates. EOMI, PERRL, no drainage present.  NOSE: Septum midline w/o deformity. Nares patent, mucosa pink and non-inflamed w/o drainage. No sinus tenderness.  THROAT: Uvula midline. Oropharynx clear. Tonsils non-inflamed without exudate. Mucous membranes pink and moist.  RESPIRATORY: Chest wall symmetrical. Respirations even and non-labored. Breath sounds clear to auscultation bilaterally.  CARDIAC: S1, S2 present, regular rate and rhythm without murmur or gallops. Peripheral pulses 2+ bilaterally.  MSK: Muscle tone and strength appropriate for age. Joints w/o tenderness, redness, or swelling.  EXTREMITIES: Without clubbing, cyanosis, or edema.  NEUROLOGIC: No motor or sensory deficits. Steady, even gait. C2-C12 intact.  PSYCH/MENTAL STATUS: Alert, oriented x 3. Cooperative, appropriate mood and affect.    No results found for any visits on 11/10/23.    Assessment & Plan:   1. 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 Chronic depression and anxiety exacerbated by medication non-adherence. Prescribe low-dose Prozac  for anxiety and depression management. Refer to psychiatry for further evaluation and management. Discuss potential use of addition of Abilify for symptom management, noting possible side effects. Consider injectable medications for long-term management, assess cost and insurance coverage. Provided patient with doctor's note and consider extending work absence until he is able to establish with  psychiatry.  - Ambulatory referral to Psychiatry  2. Psychophysiological insomnia Chronic insomnia exacerbated by anxiety and psychotic symptoms. Prescribe trazodone  50-100 mg  about 30-60 minutes before bedtime for sleep aid.   Meds ordered this encounter  Medications   traZODone  (DESYREL ) 50 MG tablet    Sig: Take 1-2 tablets (50-100 mg total) by mouth at bedtime as needed for sleep.    Dispense:  30 tablet    Refill:  2    Supervising Provider:   ZIGLAR, SUSAN K [[id number]]   FLUoxetine  (PROZAC ) 10 MG capsule    Sig: Take 1 capsule (10 mg total) by mouth daily.    Dispense:  30 capsule    Refill:  2    Supervising Provider:   ZIGLAR, SUSAN K [[id number]]    Return in about 2 weeks (around 11/24/2023) for Mood f/u.    Patient to reach out to office if new, worrisome, or unresolved symptoms arise or if no improvement in patient's condition. Patient verbalized understanding and is agreeable to treatment plan. All questions answered to patient's satisfaction.    Evalene Arts, FNP

## 2023-11-10 NOTE — Patient Instructions (Addendum)
 Below are some facilities that offer counseling services.  Please call to arrange an appointment with a counselor:                                                                    Behavioral Health/Counseling Services:  [facility]  Phone: [phone] [address] [place], [place] [address]  [facility] Phone: [phone] Various locations   [facility] Out[facility]  Phone: [phone] [address] [place], [place] [address]  [facility] [place]) Phone: [phone]  [facility] Phone: [phone] [address] [place], [place] [address]   Mental Health- Accepts Medicaid  (* = Spanish available;  + = Psychiatric services) * [facility]                            [phone]  Walk in 9am-1pm Virtual & Onsite  *+ [facility]:                                     [phone] or [phone] Virtual & Onsite  [facility]:                                              [phone] Virtual & Onsite  + [facility]:                                         [phone] Virtual & Onsite  * [facility]:                                                   [phone]   [facility]                                                    [phone] Virtual & Onsite  [facility]           [phone] Virtual   [facility]:                                                           [phone] Virtual & Onsite  * [facility]:  [phone] Virtual & Onsite  [facility]:                            [phone]   *[facility]                                                [phone] Virtual & Onsite  + [facility]:             [phone] or [phone]   [facility]                                                 [phone] Virtual & Onsite   _______________________________________________________________________________________   Rosine can also visit PsychologyToday.com to find a counselor that is within your insurance's network.  Website: https://www.psychologytoday.com/us /therapists   If you need immediate emotional support, reach out to the national mental health hotline: 988. Whether you're facing mental health struggles, emotional distress, alcohol or drug use concerns, or just need someone to talk to, our caring counselors are here for you. You are not alone.     MyChart:  For all urgent or time sensitive needs we ask that you please call the office to avoid delays. Our number is [phone]) [phone]. MyChart is not constantly monitored and due to the large volume of messages a day, replies may take up to 72 business hours.   MyChart Policy: MyChart allows for you to see your visit notes, after visit summary, provider recommendations, lab and tests results, make an appointment, request refills, and contact your provider or the office for non-urgent questions or concerns. Providers are seeing patients during normal business hours and do not have built in time to review MyChart messages.  We ask that you allow a minimum of 3 business days for responses to [employer]. For this reason, please do not send urgent requests through MyChart. Please call the office at [phone]. New and ongoing conditions may require a visit. We have virtual and in person visit available for your convenience.  Complex MyChart concerns may require a visit. Your provider may request you schedule a virtual or in person visit to ensure we are providing the best care possible. MyChart messages sent after 11:00 AM on Friday will not be received by the provider until Monday morning.    Lab and Test Results: You will receive your lab and test results on MyChart  as soon as they are completed and results have been sent by the lab or testing facility. Due to this service, you will receive your results BEFORE your provider.  I review lab and tests results each morning prior to seeing patients. Some results require collaboration with other providers to ensure you are receiving the most appropriate care. For this reason, we ask that you please allow a minimum of 3-5 business days from the time the ALL results have been received for your provider to receive and review lab and test results and contact you about these.  Most lab and test result comments from the provider will be sent through MyChart. Your provider may recommend changes to the plan of care, follow-up visits, repeat testing, ask questions, or  request an office visit to discuss these results. You may reply directly to this message or call the office at [phone] to provide information for the provider or set up an appointment. In some instances, you will be called with test results and recommendations. Please let us  know if this is preferred and we will make note of this in your chart to provide this for you.    If you have not heard a response to your lab or test results in 5 business days from all results returning to MyChart, please call the office to let us  know. We ask that you please avoid calling prior to this time unless there is an emergent concern. Due to high call volumes, this can delay the resulting process.   After Hours: For all non-emergency after hours needs, please call the office at [phone] and select the option to reach the on-call provider service. On-call services are shared between multiple [place] offices and therefore it will not be possible to speak directly with your provider. On-call providers may provide medical advice and recommendations, but are unable to provide refills for maintenance medications.  For all emergency or urgent medical needs after normal business  hours, we recommend that you seek care at the closest Urgent Care or Emergency Department to ensure appropriate treatment in a timely manner.  [facility] has a 24 hour emergency room located on the ground floor for your convenience.    Urgent Concerns During the Business Day Providers are seeing patients from 8AM to 5PM, Monday through Thursday, and 8AM to 12PM on Friday with a busy schedule and are most often not able to respond to non-urgent calls until the end of the day or the next business day. If you should have URGENT concerns during the day, please call and speak to the nurse or schedule a same day appointment so that we can address your concern without delay.    Thank you, again, for choosing me as your health care partner. I appreciate your trust and look forward to learning more about you.    Evalene Arts, FNP-C

## 2023-11-12 ENCOUNTER — Emergency Department
Admission: EM | Admit: 2023-11-12 | Discharge: 2023-11-12 | Disposition: A | Discharge status: Other Acute Inpatient Hospital | Source: Home / Self Care | Attending: Emergency Medicine | Admitting: Emergency Medicine

## 2023-11-12 ENCOUNTER — Encounter: Payer: Self-pay | Admitting: Psychiatry

## 2023-11-12 ENCOUNTER — Inpatient Hospital Stay
Admission: AD | Admit: 2023-11-12 | Discharge: 2023-11-19 | DRG: 885 | Disposition: A | Discharge status: Home / Self Care | Source: Intra-hospital | Attending: Psychiatry | Admitting: Psychiatry

## 2023-11-12 ENCOUNTER — Other Ambulatory Visit: Payer: Self-pay

## 2023-11-12 DIAGNOSIS — Z820 Family history of epilepsy and other diseases of the nervous system: Secondary | ICD-10-CM

## 2023-11-12 DIAGNOSIS — F410 Panic disorder [episodic paroxysmal anxiety] without agoraphobia: Secondary | ICD-10-CM | POA: Diagnosis not present

## 2023-11-12 DIAGNOSIS — F22 Delusional disorders: Secondary | ICD-10-CM | POA: Diagnosis not present

## 2023-11-12 DIAGNOSIS — Z79899 Other long term (current) drug therapy: Secondary | ICD-10-CM | POA: Insufficient documentation

## 2023-11-12 DIAGNOSIS — F419 Anxiety disorder, unspecified: Secondary | ICD-10-CM | POA: Insufficient documentation

## 2023-11-12 DIAGNOSIS — Z841 Family history of disorders of kidney and ureter: Secondary | ICD-10-CM | POA: Diagnosis not present

## 2023-11-12 DIAGNOSIS — F3170 Bipolar disorder, currently in remission, most recent episode unspecified: Secondary | ICD-10-CM | POA: Insufficient documentation

## 2023-11-12 DIAGNOSIS — G4700 Insomnia, unspecified: Secondary | ICD-10-CM | POA: Diagnosis not present

## 2023-11-12 DIAGNOSIS — Z91148 Patient's other noncompliance with medication regimen for other reason: Secondary | ICD-10-CM

## 2023-11-12 DIAGNOSIS — R9431 Abnormal electrocardiogram [ECG] [EKG]: Secondary | ICD-10-CM | POA: Diagnosis not present

## 2023-11-12 DIAGNOSIS — R5381 Other malaise: Secondary | ICD-10-CM | POA: Diagnosis not present

## 2023-11-12 DIAGNOSIS — F319 Bipolar disorder, unspecified: Principal | ICD-10-CM | POA: Diagnosis present

## 2023-11-12 DIAGNOSIS — Z8249 Family history of ischemic heart disease and other diseases of the circulatory system: Secondary | ICD-10-CM | POA: Diagnosis not present

## 2023-11-12 DIAGNOSIS — Z91030 Bee allergy status: Secondary | ICD-10-CM

## 2023-11-12 DIAGNOSIS — F29 Unspecified psychosis not due to a substance or known physiological condition: Secondary | ICD-10-CM | POA: Insufficient documentation

## 2023-11-12 DIAGNOSIS — F411 Generalized anxiety disorder: Secondary | ICD-10-CM | POA: Diagnosis not present

## 2023-11-12 LAB — CBC
HCT: 45.2 % (ref 39.0–52.0)
Hemoglobin: 14.7 g/dL (ref 13.0–17.0)
MCH: 27.6 pg (ref 26.0–34.0)
MCHC: 32.5 g/dL (ref 30.0–36.0)
MCV: 84.8 fL (ref 80.0–100.0)
Platelets: 260 K/uL (ref 150–400)
RBC: 5.33 MIL/uL (ref 4.22–5.81)
RDW: 11.9 % (ref 11.5–15.5)
WBC: 7.6 K/uL (ref 4.0–10.5)
nRBC: 0 % (ref 0.0–0.2)

## 2023-11-12 LAB — URINE DRUG SCREEN, QUALITATIVE (ARMC ONLY)
Amphetamines, Ur Screen: NOT DETECTED
Barbiturates, Ur Screen: NOT DETECTED
Benzodiazepine, Ur Scrn: NOT DETECTED
Cannabinoid 50 Ng, Ur ~~LOC~~: NOT DETECTED
Cocaine Metabolite,Ur ~~LOC~~: NOT DETECTED
MDMA (Ecstasy)Ur Screen: NOT DETECTED
Methadone Scn, Ur: NOT DETECTED
Opiate, Ur Screen: NOT DETECTED
Phencyclidine (PCP) Ur S: NOT DETECTED
Tricyclic, Ur Screen: NOT DETECTED

## 2023-11-12 LAB — COMPREHENSIVE METABOLIC PANEL WITH GFR
ALT: 10 U/L (ref 0–44)
AST: 27 U/L (ref 15–41)
Albumin: 4.4 g/dL (ref 3.5–5.0)
Alkaline Phosphatase: 60 U/L (ref 38–126)
Anion gap: 18 — ABNORMAL HIGH (ref 5–15)
BUN: 11 mg/dL (ref 6–20)
CO2: 19 mmol/L — ABNORMAL LOW (ref 22–32)
Calcium: 10 mg/dL (ref 8.9–10.3)
Chloride: 101 mmol/L (ref 98–111)
Creatinine, Ser: 1.31 mg/dL — ABNORMAL HIGH (ref 0.61–1.24)
GFR, Estimated: >60 mL/min (ref >60)
Glucose, Bld: 169 mg/dL — ABNORMAL HIGH (ref 70–99)
Potassium: 3.4 mmol/L — ABNORMAL LOW (ref 3.5–5.1)
Sodium: 138 mmol/L (ref 135–145)
Total Bilirubin: 0.8 mg/dL (ref 0.0–1.2)
Total Protein: 8.1 g/dL (ref 6.5–8.1)

## 2023-11-12 LAB — CK: Total CK: 99 U/L (ref 49–397)

## 2023-11-12 LAB — ETHANOL: Alcohol, Ethyl (B): <15 mg/dL (ref <15)

## 2023-11-12 MED ORDER — TRAZODONE HCL 50 MG PO TABS
50.0000 mg | ORAL_TABLET | Freq: Every evening | ORAL | Status: DC | PRN
  Administered 2023-11-13 – 2023-11-16 (×5): 50 mg via ORAL
  Filled 2023-11-12 (×6): qty 1

## 2023-11-12 MED ORDER — HALOPERIDOL 5 MG PO TABS: 5.0000 mg | ORAL_TABLET | Freq: Three times a day (TID) | ORAL | Status: DC | PRN

## 2023-11-12 MED ORDER — HALOPERIDOL LACTATE 5 MG/ML IJ SOLN: 5.0000 mg | Freq: Three times a day (TID) | INTRAMUSCULAR | Status: DC | PRN

## 2023-11-12 MED ORDER — DIPHENHYDRAMINE HCL 25 MG PO CAPS: 50.0000 mg | ORAL_CAPSULE | Freq: Three times a day (TID) | ORAL | Status: DC | PRN

## 2023-11-12 MED ORDER — DIPHENHYDRAMINE HCL 50 MG/ML IJ SOLN: 50.0000 mg | Freq: Three times a day (TID) | INTRAMUSCULAR | Status: DC | PRN

## 2023-11-12 MED ORDER — IBUPROFEN 600 MG PO TABS: 600.0000 mg | ORAL_TABLET | Freq: Three times a day (TID) | ORAL | Status: DC | PRN

## 2023-11-12 MED ORDER — ALUM & MAG HYDROXIDE-SIMETH 200-200-20 MG/5ML PO SUSP
30.0000 mL | ORAL | Status: DC | PRN
  Administered 2023-11-15: 30 mL via ORAL
  Filled 2023-11-12 (×2): qty 30

## 2023-11-12 MED ORDER — ACETAMINOPHEN 325 MG PO TABS: 650.0000 mg | ORAL_TABLET | Freq: Four times a day (QID) | ORAL | Status: DC | PRN

## 2023-11-12 MED ORDER — MAGNESIUM HYDROXIDE 400 MG/5ML PO SUSP: 30.0000 mL | Freq: Every day | ORAL | Status: DC | PRN

## 2023-11-12 MED ORDER — ONDANSETRON HCL 4 MG PO TABS: 4.0000 mg | ORAL_TABLET | Freq: Three times a day (TID) | ORAL | Status: DC | PRN

## 2023-11-12 MED ORDER — LORAZEPAM 2 MG/ML IJ SOLN: 2.0000 mg | Freq: Three times a day (TID) | INTRAMUSCULAR | Status: DC | PRN

## 2023-11-12 MED ORDER — HALOPERIDOL LACTATE 5 MG/ML IJ SOLN: 10.0000 mg | Freq: Three times a day (TID) | INTRAMUSCULAR | Status: DC | PRN

## 2023-11-12 MED ORDER — HYDROXYZINE HCL 25 MG PO TABS: 25.0000 mg | ORAL_TABLET | Freq: Three times a day (TID) | ORAL | Status: DC | PRN

## 2023-11-12 MED ORDER — ALUM & MAG HYDROXIDE-SIMETH 200-200-20 MG/5ML PO SUSP: 30.0000 mL | Freq: Four times a day (QID) | ORAL | Status: DC | PRN

## 2023-11-12 NOTE — ED Notes (Signed)
 Attempted to give report, advised rn will call back to receive it shortly

## 2023-11-12 NOTE — BH Assessment (Signed)
 Patient is to be admitted to [facility] by Psychiatric Nurse Practitioner Zelda RAMAN.  Attending Physician will be Dr. Jadapalle.   Patient has been assigned to room 305, by [facility] Charge Nurse Bobie [name].   Intake Paper Work has been signed and placed on patient chart.  ER staff is aware of the admission: Olam ORN ER Secretary   Dr. Viviann, ER MD  Naomie ORN. Patient's Nurse  [facility] Patient Access.

## 2023-11-12 NOTE — ED Notes (Signed)

## 2023-11-12 NOTE — ED Notes (Signed)
 Pt informed of admission to [facility], he is agreeable to POC and verbalizes understanding of same. Given all his personal belongings. Stable, ambulatory and in NAD.

## 2023-11-12 NOTE — ED Notes (Signed)
 Personal belongings inventoried  Shirt Shorts Underwear Socks Shoes Belt  Pt had a hat, wallet, 2 stud earrings, bracelet and watch that brother took with him.
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
## 2023-11-12 NOTE — Progress Notes (Signed)
 Admission Note:  39 yr AA male who presents IVC in no acute distress for the treatment of Paranoia and Depression. Pt appears flat and depressed. Pt was calm and cooperative with admission process. Pt denies SI and contracts for safety upon admission. Pt denies AVH @ this time but, seemed tensed and looking around suspicious. Pt reported he stopped taking his medicine because he was feeling normal, was unable to sleep for the past 4 days and begun having racing thoughts. 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 Pt skin was assessed and found to be clear of any abnormal marks apart from a scar on Lower back area, otherwise, unremarkable. PT searched and no contraband found, POC and unit policies explained and understanding verbalized. Consents obtained. Food and fluids offered, and accepted. Pt had no additional questions or concerns  Pt placed on q 15 min rounds.

## 2023-11-12 NOTE — BH Assessment (Addendum)
 Comprehensive Clinical Assessment (CCA) Screening, Triage and Referral Note  11/12/2023 Louis Newton [id number]  Chief Complaint:  Chief Complaint  Patient presents with   Hallucinations   Visit Diagnosis: Hallucinations, and Anxiety   38 y.o. male who presents to [facility] ED voluntarily for treatment. Per triage note,  "Pt comes with mom with c/o hallucinations. Pt was just got out of the car and she was driving and pt tried to get out of the car. Pt does have psych hx and hasn't sleep in two days. Pt denies any SI or HI. Pt denies any hallucinations or hearing voices. Pt appears very nervous and can't sit still."  During TTS assessment patient presents oriented x 3, restless but cooperative, and anxious mood with flat affect. He denies SI/HI/VH. The patient endorses auditory hallucinations stating "I hear helicopter, which is why haven't been sleeping well. He reports he normally gets 6-7 hours of sleep but hasn't been as of lately due to "everything that's going on". When asked to elaborate he stated "politics". TTS asked what specifically is impacting him with politics he said "a whole bunch of stuff" but would not elaborate further. Patient has paucity when speaking, responds with " Um" frequently during assessment and has trouble completing his thoughts.   Patient identifies his chief complaint being "I had a panic attack or anxiety attack at work but I don't remember it and I feel fine". Patient denies any substance use/abuse. He denies any mental health hx besides stating "I was here in 2017". He denies any dx or treatment beside anxiety.   Pt provided brother Louis Newton [phone] and mother Laverne as a collateral contact. Brother provided additional information listed below.   Patient Reported Information How did you hear about us ? Family/Friend  What Is the Reason for Your Visit/Call Today? Hallucinations.  How Long Has This Been Causing You Problems? <Week  What Do You Feel  Would Help You the Most Today? Treatment for Depression or other mood problem   Have You Recently Had Any Thoughts About Hurting Yourself? No  Are You Planning to Commit Suicide/Harm Yourself At This time? No   Have you Recently Had Thoughts About Hurting Someone Sherral? No  Are You Planning to Harm Someone at This Time? No  Explanation: No data recorded  Have You Used Any Alcohol or Drugs in the Past 24 Hours? No  How Long Ago Did You Use Drugs or Alcohol? No data recorded What Did You Use and How Much? No data recorded  Do You Currently Have a Therapist/Psychiatrist? No  Name of Therapist/Psychiatrist: No data recorded  Have You Been Recently Discharged From Any Office Practice or Programs? No  Explanation of Discharge From Practice/Program: No data recorded   CCA Screening Triage Referral Assessment Type of Contact: Face-to-Face  Telemedicine Service Delivery:   Is this Initial or Reassessment?   Date Telepsych consult ordered in [facility]:    Time Telepsych consult ordered in [facility]:    Location of Assessment: [facility] ED  Provider Location: [facility] ED    Collateral Involvement: TTS spoke with patients brother Louis [phone] in ED. Louis stated that his brother came to [facility] in march 2025 and was prescirbed medications and only took them for a week before discontinuing them. He report sa dx hx of bipolar disorder and anxiety. He stated that his brother reports feeling sluggish, tired, sleepy and could not function while taking medication. He reports he feels that this recent episode was triggered by his brother watching the  news and a recent shooting and now feels white people will kill him. He stated that his brother went to work fine and he is uncertain what he was triggered by but he became unresponsive at work and did not know his passwords or how to use the system. He stated that he took him to a PCP wednesday this week and he was prescirbed meds but did nto take them until  yesterday. He reports a history of episodes but states it has been getting progressively worse as he ages. He reports his mom informed him that Hosam first slid down into the floorboard of the car and then jumped out of her vehicle and was wadnering through traffic. ETTERBrother Louis Newton [phone])   Does Patient Have a Court Appointed Legal Guardian? No data recorded Name and Contact of Legal Guardian: No data recorded If Minor and Not Living with Parent(s), Who has Custody? N/A  Is CPS involved or ever been involved? Never  Is APS involved or ever been involved? Never   Patient Determined To Be At Risk for Harm To Self or Others Based on Review of Patient Reported Information or Presenting Complaint? Yes, for Self-Harm  Method: -- (N/A, no HI)  Availability of Means: -- (N/A, no HI)  Intent: -- (N/A, no HI)  Notification Required: -- (N/A, no HI)  Additional Information for Danger to Others Potential: Active psychosis  Additional Comments for Danger to Others Potential: N/A, no HI  Are There Guns or Other Weapons in Your Home? No  Types of Guns/Weapons: N/A  Are These Weapons Safely Secured?                            -- (N/A)  Who Could Verify You Are Able To Have These Secured: N/A  Do You Have any Outstanding Charges, Pending Court Dates, Parole/Probation? None  Contacted To Inform of Risk of Harm To Self or Others: Family/Significant Other:   Does Patient Present under Involuntary Commitment? No (Walk-In pending psych consult for IVC)    [place] of Residence: [place]   Patient Currently Receiving the Following Services: Not Receiving Services   Determination of Need: Emergent (2 hours)   Options For Referral: Inpatient Hospitalization; Medication Management; Outpatient Therapy   Disposition Recommendation per psychiatric provider: Pending Psych Consult   Drucella FORBES Burke

## 2023-11-12 NOTE — Plan of Care (Signed)
   Problem: Education: Goal: Emotional status will improve Outcome: Progressing

## 2023-11-12 NOTE — ED Provider Notes (Addendum)
 [facility] Provider Note    Event Date/Time   First MD Initiated Contact with Patient 11/12/23 [phone]     (approximate)   History   Chief Complaint: Hallucinations   HPI  Louis Newton is a 38 y.o. male with a history of bipolar disorder who is brought to the ED by his brother this morning due to patient having increasingly alarming behavior over the past few weeks, culminating in the patient trying to jump out of a moving vehicle this morning while his mother was driving.  Patient reports I got scared.  Lately he has been sleeping very poorly, only about 2 hours a night.  He has been obsessed with following news on TV and social media.  He reports feeling overwhelmed, feels guilty.  Also reports that last night he was hearing things inside his head, and seeing the signs.  Denies SI or HI.     Physical Exam   Triage Vital Signs: ED Triage Vitals [11/12/23 0921]  Encounter Vitals Group     BP (!) 197/115     Girls Systolic BP Percentile      Girls Diastolic BP Percentile      Boys Systolic BP Percentile      Boys Diastolic BP Percentile      Pulse Rate (!) 130     Resp 18     Temp 98 F (36.7 C)     Temp src      SpO2 99 %     Weight 150 lb (68 kg)     Height 5' 8 (1.727 m)     Head Circumference      Peak Flow      Pain Score 0     Pain Loc      Pain Education      Exclude from Growth Chart     Most recent vital signs: Vitals:   11/12/23 0921  BP: (!) 197/115  Pulse: (!) 130  Resp: 18  Temp: 98 F (36.7 C)  SpO2: 99%    General: Awake, no distress. CV:  Good peripheral perfusion.  Regular rate rhythm, heart rate 85 Resp:  Normal effort.  Clear lungs Abd:  No distention.  Soft nontender Other:  No wounds.  Thought process appears disorganized.  Restricted affect   ED Results / Procedures / Treatments   Labs (all labs ordered are listed, but only abnormal results are displayed) Labs Reviewed  COMPREHENSIVE METABOLIC  PANEL WITH GFR  ETHANOL  URINE DRUG SCREEN, QUALITATIVE ([facility] ONLY)  CK  CBC     EKG    RADIOLOGY    PROCEDURES:  Procedures   MEDICATIONS ORDERED IN ED: Medications  ibuprofen  (ADVIL ) tablet 600 mg (has no administration in time range)  ondansetron  (ZOFRAN ) tablet 4 mg (has no administration in time range)  alum & mag hydroxide-simeth (MAALOX/MYLANTA) 200-200-20 MG/5ML suspension 30 mL (has no administration in time range)     IMPRESSION / MDM / ASSESSMENT AND PLAN / ED COURSE  I reviewed the triage vital signs and the nursing notes.  Patient's presentation is most consistent with severe exacerbation of chronic illness.  Patient presents with symptoms of acute psychosis related to bipolar disorder diagnosis from the past.  On my exam vital signs are normal, no acute medical complaints.  He is medically stable.  Thought process seems disorganized, judgment is severely impaired, I will initiate IVC pending psychiatry evaluation.  The patient has been placed in psychiatric observation due to  the need to provide a safe environment for the patient while obtaining psychiatric consultation and evaluation, as well as ongoing medical and medication management to treat the patient's condition.  The patient has been placed under full IVC at this time.      FINAL CLINICAL IMPRESSION(S) / ED DIAGNOSES   Final diagnoses:  Bipolar affective disorder, remission status unspecified (HCC)     Rx / DC Orders   ED Discharge Orders     None        Note:  This document was prepared using Dragon voice recognition software and may include unintentional dictation errors.   Viviann Pastor, MD 11/12/23 [id number]    Viviann Pastor, MD 11/12/23 [phone]

## 2023-11-12 NOTE — ED Notes (Signed)
IVC  MOVED TO  [facility]  PENDING  CONSULT 

## 2023-11-12 NOTE — ED Notes (Signed)
 Report given to [place], RN

## 2023-11-12 NOTE — ED Notes (Signed)
 Patient roomed to 21 approx 30 minutes ago, accompanied by brother and Visual merchandiser. Patient calm and cooperative on arrival. Appears somewhat catatonic. Changed out to red scrubs, personal belongings collected and inventoried. Brother took patients hat, wallet, 2 stud earrings, 1 watch and 1 bracelet with him.

## 2023-11-12 NOTE — ED Triage Notes (Signed)
 Pt comes with mom with c/o hallucinations. Pt was just got out of the car and she was driving and pt tried to get out of the car. Pt does have psych hx and hasn't sleep in two days.   Pt denies any SI or HI. Pt denies any hallucinations or hearing voices. Pt appears very nervous and can't sit still.

## 2023-11-12 NOTE — ED Notes (Signed)
 IVC PENDING  CONSULT ?

## 2023-11-12 NOTE — Consult Note (Signed)
 [facility] Psychiatric Consult Initial  Patient Name: .Louis Newton  MRN: [id number]  DOB: 10-Jan-1986  Consult Order details:  Orders (From admission, onward)     Start     Ordered   11/12/23 0952  CONSULT TO CALL ACT TEAM       Ordering Provider: Viviann Pastor, MD  Provider:  (Not yet assigned)  Question:  Reason for Consult?  Answer:  Psych consult   11/12/23 0951   11/12/23 0952  IP CONSULT TO PSYCHIATRY       Ordering Provider: Viviann Pastor, MD  Provider:  (Not yet assigned)  Question Answer Comment  Consult Timeframe URGENT - requires response within 12 hours   URGENT timeframe requires provider to provider communication, has the provider to provider communication been completed Yes   Reason for Consult? Consult for medication management   Contact phone number where the requesting provider can be reached [phone]      11/12/23 0951             Mode of Visit: In person    Psychiatry Consult Evaluation  Service Date: November 12, 2023 LOS:  LOS: 0 days  Chief Complaint Just anxiety, manic or panic attack  Primary Psychiatric Diagnoses  Bipolar 1 disorder   Assessment  Louis Newton is a 38 y.o. male admitted: Presented to the ED   Per EDP note: Louis Newton is a 38 y.o. male with a history of bipolar disorder who is brought to the ED by his brother this morning due to patient having increasingly alarming behavior over the past few weeks, culminating in the patient trying to jump out of a moving vehicle this morning while his mother was driving.  Patient reports I got scared.  Lately he has been sleeping very poorly, only about 2 hours a night.  He has been obsessed with following news on TV and social media.  He reports feeling overwhelmed, feels guilty.  Also reports that last night he was hearing things inside his head, and seeing the signs.  Denies SI or HI.    During assessment, the patient denied suicidal or homicidal thoughts. He  denied auditory hallucinations and denied visual hallucinations. However, per triage note, patient attempted to exit a moving car while mother was driving and it was reported that patient had not slept in days. His presentation os paranoia, restlessness, insomnia and auditory hallucinations is most consistent with acute exacerbation of diagnosed bipolar 1 affective disorder.  Patient does meet requirement for IVC and inpatient admission due to safety concerns, not currently taking medications and lack of outpatient resources. The patient remains at high risk for decompensation without inpatient stabilization.    Diagnoses:  Active Hospital problems: Active Problems:   Bipolar 1 disorder (HCC)   Generalized anxiety disorder    Plan   ## Psychiatric Medication Recommendations:  -PRN hydroxyzine  25mg  3 times daily added to [facility] for anxiety -Patient declined other medication at this time ## Medical Decision Making Capacity: Not specifically addressed in this encounter  ## Further Work-up:    -- Pertinent labwork reviewed earlier this admission includes: CMP, Ethanol, Urine Drug Screen, CK, CBC   ## Disposition:-- We recommend inpatient psychiatric hospitalization after medical hospitalization. Patient has been involuntarily committed on 11/12/2023.   ## Behavioral / Environmental: -Utilize compassion and acknowledge the patient's experiences while setting clear and realistic expectations for care.    ## Safety and Observation Level:  - Based on my clinical evaluation, I estimate the patient  to be at moderate risk of self harm in the current setting. - At this time, we recommend  routine. This decision is based on my review of the chart including patient's history and current presentation, interview of the patient, mental status examination, and consideration of suicide risk including evaluating suicidal ideation, plan, intent, suicidal or self-harm behaviors, risk factors, and protective  factors. This judgment is based on our ability to directly address suicide risk, implement suicide prevention strategies, and develop a safety plan while the patient is in the clinical setting. Please contact our team if there is a concern that risk level has changed.  CSSR Risk Category:C-SSRS RISK CATEGORY: No Risk- patient denying suicidal thoughts, but with high risk behaviors such as attempting to jump out of moving car  Suicide Risk Assessment: Patient has following modifiable risk factors for suicide: medication noncompliance and current symptoms: anxiety/panic, insomnia, impulsivity, anhedonia, hopelessness, which we are addressing by recommending inpatient psychiatric hospitalization. Patient has following non-modifiable or demographic risk factors for suicide: male gender and psychiatric hospitalization Patient has the following protective factors against suicide: no history of suicide attempts  Thank you for this consult request. Recommendations have been communicated to the primary team.  We will sign off at this time.   Zelda Sharps, NP        History of Present Illness  Relevant Aspects of Hospital ED   Patient Report:  Patient is 38 year old male noted to be sitting on stretcher in room. Patient appeared anxious and restless during interview. Patient with pressured speech, but not elevated tone of voice. Patient was cooperative with assessment.   During interview, patient stated Just anxiety, manic or panic attack. When asked to elaborate he stated he was at work and they told him he needed to go home. He reported he works at [employer] in [employer]. I asked why they sent him home, if he was acting aggressive, and he stated No, I don't really know. He is currently denying SI/HI. He denied AH/VH to myself, but was noted to report to EDP that he could hear things in his head. We discussed inpatient treatment for mood stabilization and patient was agreeable to plan. Will maintain  IVC due to patient erratic behaviors that were reported by family. Patient denied any diagnosed mental health conditions besides anxiety, but per chart review patient has been diagnosed with bipolar 1 disorder and is non-compliant with medications.  Per TTS report: During TTS assessment patient presents oriented x 3, restless but cooperative, and anxious mood with flat affect. He denies SI/HI/VH. The patient endorses auditory hallucinations stating "I hear helicopter, which is why haven't been sleeping well. He reports he normally gets 6-7 hours of sleep but hasn't been as of lately due to "everything that's going on". When asked to elaborate he stated "politics". TTS asked what specifically is impacting him with politics he said "a whole bunch of stuff" but would not elaborate further. Patient has paucity when speaking, responds with " Um" frequently during assessment and has trouble completing his thoughts.    Patient identifies his chief complaint being "I had a panic attack or anxiety attack at work but I don't remember it and I feel fine". Patient denies any substance use/abuse. He denies any mental health hx besides stating "I was here in 2017". He denies any dx or treatment beside anxiety.    Pt provided brother Louis Newton [phone] and mother Louis Newton as a collateral contact. Brother provided additional information listed below.  Psych ROS:  Depression: Denied Anxiety:  Endorsed feelings of not being able to keep up with thoughts, racing thoughts, restlessness Mania (lifetime and current): Per chart review, patient previously diagnosed with bipolar 1 disorder Psychosis: (lifetime and current): Reported to TTS and EDP auditory hallucinations see above  Collateral information:  TTS spoke with patients brother Louis [phone] in ED. Louis stated that his brother came to [facility] in march 2025 and was prescirbed medications and only took them for a week before discontinuing them. He report sa dx  hx of bipolar disorder and anxiety. He stated that his brother reports feeling sluggish, tired, sleepy and could not function while taking medication. He reports he feels that this recent episode was triggered by his brother watching the news and a recent shooting and now feels white people will kill him. He stated that his brother went to work fine and he is uncertain what he was triggered by but he became unresponsive at work and did not know his passwords or how to use the system. He stated that he took him to a PCP wednesday this week and he was prescirbed meds but did nto take them until yesterday. He reports a history of episodes but states it has been getting progressively worse as he ages. He reports his mom informed him that Paublo first slid down into the floorboard of the car and then jumped out of her vehicle and was wadnering through traffic. 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[employer], chartered Hx: Denied Living Situation: Living with mother Spiritual Hx: Unknown Access to weapons/lethal means: Denied   Substance History Alcohol: Denied  Type of alcohol Denied  Last Drink Denied  Number of drinks per day Denied  History of alcohol withdrawal seizures Denied  History of DT's Denied  Tobacco: Denied  Illicit drugs: Denied  Prescription drug abuse: Denied  Rehab hx: Denied   Exam Findings  Physical Exam: Deferred to EDP- note reviewed    Vital Signs:  Temp:  [98 F (36.7 C)-98.4 F (36.9 C)] 98.4 F (36.9 C) (09/19 1609) Pulse Rate:  [79-130] 79 (09/19 1609) Resp:  [18-19] 19 (09/19 1609) BP: (151-197)/(95-115) 151/95 (09/19 1609) SpO2:  [99 %-100 %] 100 % (09/19 1609) Weight:  [68 kg] 68 kg (09/19 0921) Blood pressure (!) 151/95, pulse 79, temperature 98.4 F (36.9 C), temperature source Oral, resp. rate 19, height 5' 8 (1.727 m), weight 68 kg, SpO2 100%. Body mass index is 22.81 kg/m.    Mental Status Exam: General Appearance: Fairly Groomed  Orientation:  Full (Time, Place, and Person)  Memory:  Immediate;   Fair Recent;   Poor Remote;   Fair  Concentration:  Concentration: Fair and Attention Span: Fair  Recall:  Poor  Attention  Fair  Eye Contact:  Minimal  Speech:  Pressured  Language:  Good  Volume:  Normal  Mood: :just anxiety  Affect:  Appropriate  Thought Process:  Coherent  Thought Content:  Paranoid Ideation  Suicidal Thoughts:  No  Homicidal Thoughts:  No  Judgement:  Impaired  Insight:  Lacking  Psychomotor Activity:  Restlessness  Akathisia:  No  Fund of Knowledge:  Fair      Assets:  Engineer, maintenance Social Support  Cognition:  WNL  ADL's:  Intact  AIMS (if indicated):        Other History   These have been pulled in through the EMR, reviewed, and updated if appropriate.  Family History:  The patient's family history includes Dementia in his paternal grandfather; Epilepsy in his brother; Heart disease in his maternal grandmother; Hypertension in his brother; Kidney disease in his father.  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 No pertinent surgical history.   Medications:   Current Facility-Administered  Medications:    alum & mag hydroxide-simeth (MAALOX/MYLANTA) 200-200-20 MG/5ML suspension 30 mL, 30 mL, Oral, Q6H PRN, Viviann Pastor, MD   hydrOXYzine  (ATARAX ) tablet 25 mg, 25 mg, Oral, TID PRN, Ulysess Witz B, NP   ibuprofen  (ADVIL ) tablet 600 mg, 600 mg, Oral, Q8H PRN, Viviann Pastor, MD   ondansetron  (ZOFRAN ) tablet 4 mg, 4 mg, Oral, Q8H PRN, Viviann Pastor, MD  Current Outpatient Medications:    FLUoxetine  (PROZAC ) 10 MG capsule, Take 1 capsule (10 mg total) by mouth daily., Disp: 30 capsule, Rfl: 2   traZODone  (DESYREL ) 50 MG tablet, Take 1-2 tablets (50-100 mg total) by mouth at bedtime as needed for sleep., Disp: 30 tablet, Rfl: 2  Allergies: Allergies  Allergen Reactions   Bee Venom Swelling    THROAT SWELLING    Roxy Mastandrea, NP

## 2023-11-13 MED ORDER — ARIPIPRAZOLE 5 MG PO TABS
5.0000 mg | ORAL_TABLET | Freq: Every day | ORAL | Status: DC
  Administered 2023-11-13 – 2023-11-14 (×2): 5 mg via ORAL
  Filled 2023-11-13 (×2): qty 1

## 2023-11-13 MED ORDER — HYDROXYZINE HCL 25 MG PO TABS
25.0000 mg | ORAL_TABLET | Freq: Three times a day (TID) | ORAL | Status: DC | PRN
  Administered 2023-11-13: 25 mg via ORAL
  Filled 2023-11-13: qty 1

## 2023-11-13 MED ORDER — ARIPIPRAZOLE 5 MG PO TABS: 5.0000 mg | ORAL_TABLET | Freq: Every day | ORAL | Status: DC

## 2023-11-13 NOTE — Plan of Care (Signed)
 Louis Newton is a 38 y.o. male patient. No diagnosis found. 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   History of rhabdomyolysis 09/07/2015   Involuntary commitment 09/07/2015   Rhabdomyolysis 09/07/2015   Current Facility-Administered Medications  Medication Dose Route Frequency Provider Last Rate Last Admin   acetaminophen  (TYLENOL ) tablet 650 mg  650 mg Oral Q6H PRN Smith, Annie B, NP       alum & mag hydroxide-simeth (MAALOX/MYLANTA) 200-200-20 MG/5ML suspension 30 mL  30 mL Oral Q4H PRN Smith, Annie B, NP       haloperidol  (HALDOL ) tablet 5 mg  5 mg Oral TID PRN Smith, Annie B, NP       And   diphenhydrAMINE  (BENADRYL ) capsule 50 mg  50 mg Oral TID PRN Smith, Annie B, NP       haloperidol  lactate (HALDOL ) injection 5 mg  5 mg Intramuscular TID PRN Smith, Annie B, NP       And   diphenhydrAMINE  (BENADRYL ) injection 50 mg  50 mg Intramuscular TID PRN Smith, Annie B, NP       And   LORazepam  (ATIVAN ) injection 2 mg  2 mg Intramuscular TID PRN Smith, Annie B, NP       haloperidol  lactate (HALDOL ) injection 10 mg  10 mg Intramuscular TID PRN Smith, Annie B, NP       And   diphenhydrAMINE  (BENADRYL ) injection 50 mg  50 mg Intramuscular TID PRN Smith, Annie B, NP       And   LORazepam  (ATIVAN ) injection 2 mg  2 mg Intramuscular TID PRN Smith, Annie B, NP       magnesium  hydroxide (MILK OF MAGNESIA) suspension 30 mL  30 mL Oral Daily PRN Smith, Annie B, NP       traZODone  (DESYREL ) tablet 50 mg  50 mg Oral QHS PRN Smith, Annie B, NP   50 mg at 11/13/23 0000   Allergies  Allergen Reactions   Bee Venom Swelling    THROAT SWELLING   Principal Problem:   Bipolar 1 disorder (HCC)  Height 5' 9 (1.753 m), weight 68 kg.  Subjective Objective Assessment & Plan  Channin Agustin B Jeannett Dekoning 11/13/2023

## 2023-11-13 NOTE — H&P (Addendum)
 Psychiatric Admission Assessment Adult  Patient Identification: Louis Newton MRN:  [id number] Date of Evaluation:  11/13/2023 Chief Complaint:  Bipolar 1 disorder (HCC) [F31.9]   History of Present Illness: Patient is 38 year old male admitted to the inpatient unit for stabilization and monitoring. Patient appears anxious on exam. Interacting well with peers and staff on the unit. Patient carries the previous diagnosis of bipolar 1 disorder Patient appeared anxious and restless during interview. Patient with pressured speech, but not elevated tone of voice. Patient was cooperative with assessment.    During interview today, patient reiterated previously stated information that was provided to this provider in the emergency department. He stated Just anxiety, manic or panic attack. When asked to elaborate he stated he was at work and they told him he needed to go home. He reported he works at [employer] in [employer]. I asked why they sent him home, if he was acting aggressive, and he stated No, I don't really know. He is currently denying SI/HI.   Today, patient reported struggling to get his mind to calm down. He reported poor sleep last night and increased anxiety levels. He reported being previously prescribed risperidone  and Depakote . He reported not liking these medications because it made him feel sleepy and groggy. He is still denying SI/HI. He denied VH, but endorsed hearing what sounded like a gun shot occasionally throughout the day today.Per family report, patient was very paranoid prior to being brought into the inpatient unit.  TTS spoke with patients brother Louis [phone] in ED. Louis stated that his brother came to [facility] in march 2025 and was prescirbed medications and only took them for a week before discontinuing them. He report sa dx hx of bipolar disorder and anxiety. He stated that his brother reports feeling sluggish, tired, sleepy and could not function while  taking medication. He reports he feels that this recent episode was triggered by his brother watching the news and a recent shooting and now feels white people will kill him. He stated that his brother went to work fine and he is uncertain what he was triggered by but he became unresponsive at work and did not know his passwords or how to use the system. He stated that he took him to a PCP wednesday this week and he was prescirbed meds but did nto take them until yesterday. He reports a history of episodes but states it has been getting progressively worse as he ages. He reports his mom informed him that Louis Newton first slid down into the floorboard of the car and then jumped out of her vehicle and was wadnering through traffic. (Brother Louis Newton [phone]) .   He was recently admitted to the inpatient unit in March of 2025- verified by chart review and family.     Total Time spent with patient: 1 hour Sleep  Sleep:Sleep: Poor Number of Hours of Sleep: 2  Past Psychiatric History: Bipolar 1 disorder Psychiatric History:  Information collected from Patient  Prev Dx/Sx: Bipolar 1 disorder  Current Psych Provider: none Home Meds (current): not taking Previous Med Trials: Depakote  and risperidone  Therapy: None  Prior Psych Hospitalization: March 2025, 2017 admission noted in chart  Prior Self Harm: Denied Prior Violence: Denied  Family Psych History: Denied Family Hx suicide: Denied  Social History:  Developmental Hx: Reported meeting all milestones Educational Hx: Finished high school  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 Yes.    Has the patient been a risk to self in the past 6 months? Yes.    Has the patient been a risk to self within the distant past? Yes.    Is the patient a risk to others? No.  Has the patient been a risk to others in the past 6 months? No.  Has the patient been a risk to others within the distant past? No.   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 No pertinent surgical history. Family History:  Family History  Problem Relation Age of Onset   Kidney disease Father    Hypertension Brother    Epilepsy Brother    Heart disease Maternal Grandmother    Dementia Paternal Grandfather     Social History:  Social History   Substance and Sexual Activity  Alcohol Use Not Currently     Social History   Substance and Sexual Activity  Drug Use No      Allergies:   Allergies  Allergen Reactions   Bee Venom Swelling    THROAT SWELLING   Lab Results:  Results for orders placed or performed during the hospital encounter of 11/12/23 (from the past 48 hours)  Comprehensive metabolic panel     Status: Abnormal   Collection Time: 11/12/23  9:22 AM  Result Value Ref Range   Sodium 138 135 - 145 mmol/L   Potassium 3.4 (L) 3.5 - 5.1 mmol/L    Chloride 101 98 - 111 mmol/L   CO2 19 (L) 22 - 32 mmol/L   Glucose, Bld 169 (H) 70 - 99 mg/dL    Comment: Glucose reference range applies only to samples taken after fasting for at least 8 hours.   BUN 11 6 - 20 mg/dL   Creatinine, Ser 8.68 (H) 0.61 - 1.24 mg/dL   Calcium 89.9 8.9 - 89.6 mg/dL   Total Protein 8.1 6.5 - 8.1 g/dL   Albumin 4.4 3.5 - 5.0 g/dL   AST 27 15 - 41 U/L   ALT 10 0 - 44 U/L   Alkaline Phosphatase 60 38 - 126 U/L   Total Bilirubin 0.8 0.0 - 1.2 mg/dL   GFR, Estimated >39 >39 mL/min    Comment: (NOTE) Calculated using the CKD-EPI Creatinine Equation (2021)    Anion gap 18 (H) 5 - 15    Comment: Performed at [facility], [address], [place], [place] [address]  Ethanol     Status: None   Collection Time: 11/12/23  9:22 AM  Result Value Ref Range   Alcohol, Ethyl (B) <15 <15 mg/dL    Comment: (NOTE) For medical purposes only. Performed at [facility], [address] [place], [place] [address]   Urine Drug Screen, Qualitative     Status: None   Collection Time: 11/12/23  9:56 AM  Result Value Ref Range   Tricyclic, Ur Screen NONE DETECTED NONE DETECTED   Amphetamines, Ur Screen NONE DETECTED NONE DETECTED   MDMA (Ecstasy)Ur Screen NONE DETECTED NONE DETECTED   Cocaine Metabolite,Ur [place] NONE DETECTED NONE DETECTED   Opiate, Ur Screen NONE DETECTED NONE DETECTED   Phencyclidine (PCP) Ur S NONE DETECTED NONE DETECTED   Cannabinoid 50 Ng, Ur [place] NONE DETECTED NONE DETECTED   Barbiturates, Ur Screen NONE DETECTED NONE DETECTED   Benzodiazepine, Ur Scrn NONE DETECTED NONE DETECTED   Methadone Scn, Ur NONE DETECTED NONE DETECTED    Comment: (NOTE) Tricyclics + metabolites, urine    Cutoff 1000 ng/mL Amphetamines + metabolites, urine  Cutoff 1000 ng/mL MDMA (Ecstasy), urine              Cutoff 500 ng/mL Cocaine Metabolite, urine          Cutoff 300 ng/mL Opiate + metabolites, urine        Cutoff 300 ng/mL Phencyclidine (PCP), urine          Cutoff 25 ng/mL Cannabinoid, urine                 Cutoff 50 ng/mL Barbiturates + metabolites, urine  Cutoff 200 ng/mL Benzodiazepine, urine              Cutoff 200 ng/mL Methadone, urine                   Cutoff 300 ng/mL  The urine drug screen provides only a preliminary, unconfirmed analytical test result and should not be used for non-medical purposes. Clinical consideration and professional judgment should be applied to any positive drug screen result due to possible interfering substances. A more specific alternate chemical method must be used in order to obtain a confirmed analytical result. Gas chromatography / mass spectrometry (GC/MS) is the preferred confirm atory method. Performed at [facility], [address] [place], [place] [address]   CK     Status: None   Collection Time: 11/12/23 10:10 AM  Result Value Ref Range   Total CK 99 49 - 397 U/L    Comment: Performed at [facility], [address] [place], [place] [address]  CBC     Status: None   Collection Time: 11/12/23 10:10 AM  Result Value Ref Range   WBC 7.6 4.0 - 10.5 K/uL   RBC 5.33 4.22 - 5.81 MIL/uL   Hemoglobin 14.7 13.0 - 17.0 g/dL   HCT 54.7 60.9 - 47.9 %   MCV 84.8 80.0 - 100.0 fL   MCH 27.6 26.0 - 34.0 pg   MCHC 32.5 30.0 - 36.0 g/dL   RDW 88.0 88.4 - 84.4 %   Platelets 260 150 - 400 K/uL   nRBC 0.0 0.0 - 0.2 %    Comment: Performed at [facility], [address], [place], [place] [address]    Blood Alcohol level:  Lab Results  Component Value Date   [facility] <15 11/12/2023   ETH <10 05/07/2023    Metabolic Disorder Labs:  Lab Results  Component  Value Date   HGBA1C 5.5 05/17/2023   MPG 102.54 05/11/2023   MPG 99.67 05/07/2023   Lab Results  Component Value Date   PROLACTIN 18.4 (H) 09/11/2015   Lab Results  Component Value Date   CHOL 170 05/17/2023   TRIG 86 05/17/2023   HDL 61 05/17/2023   CHOLHDL 2.8 05/17/2023   VLDL 11 05/11/2023   LDLCALC  93 05/17/2023   LDLCALC 91 05/11/2023    Current Medications: Current Facility-Administered Medications  Medication Dose Route Frequency Provider Last Rate Last Admin   acetaminophen  (TYLENOL ) tablet 650 mg  650 mg Oral Q6H PRN Ieisha Gao B, NP       alum & mag hydroxide-simeth (MAALOX/MYLANTA) 200-200-20 MG/5ML suspension 30 mL  30 mL Oral Q4H PRN Fruma Africa B, NP       ARIPiprazole  (ABILIFY ) tablet 5 mg  5 mg Oral Daily Fraya Ueda B, NP       haloperidol  (HALDOL ) tablet 5 mg  5 mg Oral TID PRN Aurore Redinger B, NP       And   diphenhydrAMINE  (BENADRYL ) capsule 50 mg  50 mg Oral TID PRN Eligh Rybacki B, NP       haloperidol  lactate (HALDOL ) injection 5 mg  5 mg Intramuscular TID PRN Pope Brunty B, NP       And   diphenhydrAMINE  (BENADRYL ) injection 50 mg  50 mg Intramuscular TID PRN Chae Oommen B, NP       And   LORazepam  (ATIVAN ) injection 2 mg  2 mg Intramuscular TID PRN Lakayla Barrington B, NP       haloperidol  lactate (HALDOL ) injection 10 mg  10 mg Intramuscular TID PRN Silvano Garofano B, NP       And   diphenhydrAMINE  (BENADRYL ) injection 50 mg  50 mg Intramuscular TID PRN Dragan Tamburrino B, NP       And   LORazepam  (ATIVAN ) injection 2 mg  2 mg Intramuscular TID PRN Larue Lightner B, NP       hydrOXYzine  (ATARAX ) tablet 25 mg  25 mg Oral TID PRN Gale Klar B, NP       magnesium  hydroxide (MILK OF MAGNESIA) suspension 30 mL  30 mL Oral Daily PRN Genea Rheaume B, NP       traZODone  (DESYREL ) tablet 50 mg  50 mg Oral QHS PRN Devontay Celaya B, NP   50 mg at 11/13/23 0000   PTA Medications: Medications Prior to Admission  Medication Sig Dispense Refill Last Dose/Taking   FLUoxetine  (PROZAC ) 10 MG capsule Take 1 capsule (10 mg total) by mouth daily. 30 capsule 2    traZODone  (DESYREL ) 50 MG tablet Take 1-2 tablets (50-100 mg total) by mouth at bedtime as needed for sleep. 30 tablet 2     Psychiatric Specialty Exam:  Presentation  General Appearance:  Appropriate for Environment  Eye  Contact: Fair  Speech: Other (comment) (Increased rate)  Speech Volume: Normal    Mood and Affect  Mood: Depressed; Anxious  Affect: Depressed   Thought Process  Thought Processes: Coherent  Descriptions of Associations:Intact  Orientation:Full (Time, Place and Person)  Thought Content:Paranoid Ideation  Hallucinations:Hallucinations: Auditory Description of Auditory Hallucinations: reported hearing what sounded like gun shots today  Ideas of Reference:Paranoia  Suicidal Thoughts:Suicidal Thoughts: No  Homicidal Thoughts:Homicidal Thoughts: No   Sensorium  Memory: Immediate Fair; Recent Poor; Remote Fair  Judgment: Fair  Insight: Fair   Executive Functions  Concentration: Poor  Attention Span: Poor  Recall: Poor  Fund of Knowledge: Fair  Language: Fair   Psychomotor Activity  Psychomotor Activity:No data recorded  Assets  Assets: Communication Skills; Desire for Improvement; Social Support; Housing; Surveyor, quantity Resources/Insurance    Musculoskeletal: Strength & Muscle Tone: within normal limits Gait & Station: normal  Physical Exam: Physical Exam Pulmonary:     Effort: Pulmonary effort is normal.  Musculoskeletal:     Cervical back: Normal range of motion.  Neurological:     Mental Status: He is alert and oriented to person, place, and time.    Review of Systems  Respiratory:  Negative for shortness of breath.   Cardiovascular:  Negative for chest pain.  Neurological:  Negative for dizziness.  Psychiatric/Behavioral:  Positive for hallucinations. Negative for suicidal ideas. The patient is nervous/anxious and has insomnia.    Blood pressure (!) 151/97, pulse 96, temperature 97.7 F (36.5 C), resp. rate 17, height 5' 9 (1.753 m), weight 68 kg, SpO2 (!) 89%. Body mass index is 22.15 kg/m.  Principal Diagnosis: Bipolar 1 disorder (HCC) Diagnosis:  Principal Problem:   Bipolar 1 disorder (HCC)   Clinical Decision  Making:  Treatment Plan Summary:  Safety and Monitoring:             -- Voluntary admission to inpatient psychiatric unit for safety, stabilization and treatment             -- Daily contact with patient to assess and evaluate symptoms and progress in treatment             -- Patient's case to be discussed in multi-disciplinary team meeting             -- Observation Level: q15 minute checks             -- Vital signs:  q12 hours             -- Precautions: suicide, elopement, and assault   2. Psychiatric Diagnoses and Treatment:         -start aripiprazole  5mg  daily for auditory hallucinations and mania symptoms   -continue trazodone   50mg  nightly for sleep as needed -hydroxyzine  25mg  3 times daily PRN for anxiety   -Patient reported risperidone  made him sleepy and groggy. We discussed other possible options for treatment and agreed on aripiprazole  5mg  daily to start. Patient wished to start with one medication at a time for symptom management and agreed to begin at low dose to establish tolerability. We discussed risk and benefits of medication and side effects to look out for including abnormal movements or stiffness.   -- The risks/benefits/side-effects/alternatives to this medication were discussed in detail with the patient and time was given for questions. The patient consents to medication trial.                -- Metabolic profile and EKG monitoring obtained while on an atypical antipsychotic (BMI: Lipid Panel: HbgA1c: QTc:)              -- Encouraged patient to participate in unit milieu and in scheduled group therapies  -Lipid and hemoglobin A1c ordered to be collected on 11/14/23                           3. Medical Issues Being Addressed:   -Continue to monitor patient's bp. His bp today is 151/97. Denies chest pain, shortness of breath or other symptoms. Was cleared by emergency room provider to follow up with outpatient PCP for blood pressure  treatment.   4. Discharge  Planning:              -- Social work and case management to assist with discharge planning and identification of hospital follow-up needs prior to discharge             -- Estimated LOS: 5-7 days             -- Discharge Concerns: Need to establish a safety plan; Medication compliance and effectiveness             -- Discharge Goals: Return home with outpatient referrals follow ups  Physician Treatment Plan for Primary Diagnosis: Bipolar 1 disorder (HCC) Long Term Goal(s): Improvement in symptoms so as ready for discharge  Short Term Goals: Ability to identify changes in lifestyle to reduce recurrence of condition will improve, Ability to verbalize feelings will improve, Ability to identify and develop effective coping behaviors will improve, and Ability to maintain clinical measurements within normal limits will improve  Physician Treatment Plan for Secondary Diagnosis: Principal Problem:   Bipolar 1 disorder (HCC)  Long Term Goal(s): Improvement in symptoms so as ready for discharge  Short Term Goals: Ability to identify changes in lifestyle to reduce recurrence of condition will improve, Ability to verbalize feelings will improve, Ability to demonstrate self-control will improve, and Ability to identify and develop effective coping behaviors will improve  I certify that inpatient services furnished can reasonably be expected to improve the patient's condition.    Zelda Sharps, NP

## 2023-11-13 NOTE — Progress Notes (Signed)
   11/13/23 0900  Psychosocial Assessment  Patient Complaints Anxiety  Eye Contact Brief  Facial Expression Sad  Affect Depressed  Speech Logical/coherent  Interaction Assertive  Motor Activity Slow  Appearance/Hygiene Unremarkable  Behavior Characteristics Cooperative  Mood Depressed  Thought Process  Coherency WDL  Content WDL  Delusions None reported or observed  Perception WDL  Hallucination None reported or observed  Judgment Impaired  Confusion WDL  Danger to Self  Current suicidal ideation? Denies  Agreement Not to Harm Self Yes  Description of Agreement verbal  Danger to Others  Danger to Others None reported or observed

## 2023-11-13 NOTE — Plan of Care (Signed)
   Problem: Education: Goal: Emotional status will improve Outcome: Progressing Goal: Mental status will improve Outcome: Progressing   Problem: Activity: Goal: Interest or engagement in activities will improve Outcome: Progressing
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
## 2023-11-13 NOTE — Group Note (Signed)
 Date:  11/13/2023 Time:  12:03 PM  Group Topic/Focus:  Emotional Education:   The focus of this group is to discuss what feelings/emotions are, and how they are experienced.    Participation Level:  Active  Participation Quality:  Appropriate  Affect:  Appropriate  Cognitive:  Appropriate  Insight: Appropriate  Engagement in Group:  Engaged  Modes of Intervention:  Activity  Additional Comments:    Louis Newton 11/13/2023, 12:03 PM

## 2023-11-14 LAB — LIPID PANEL
Cholesterol: 171 mg/dL (ref 0–200)
HDL: 48 mg/dL (ref >40)
LDL Cholesterol: 108 mg/dL — ABNORMAL HIGH (ref 0–99)
Total CHOL/HDL Ratio: 3.6 ratio
Triglycerides: 73 mg/dL (ref <150)
VLDL: 15 mg/dL (ref 0–40)

## 2023-11-14 LAB — HEMOGLOBIN A1C
Hgb A1c MFr Bld: 4.9 % (ref 4.8–5.6)
Mean Plasma Glucose: 93.93 mg/dL

## 2023-11-14 MED ORDER — ARIPIPRAZOLE 10 MG PO TABS
10.0000 mg | ORAL_TABLET | Freq: Every day | ORAL | Status: DC
  Administered 2023-11-15 – 2023-11-19 (×5): 10 mg via ORAL
  Filled 2023-11-14 (×5): qty 1

## 2023-11-14 NOTE — Progress Notes (Signed)
   11/14/23 0553  Psych Admission Type (Psych Patients Only)  Admission Status Voluntary  Psychosocial Assessment  Patient Complaints Anxiety  Eye Contact Brief  Facial Expression Anxious  Affect Anxious  Speech Logical/coherent  Interaction Cautious  Motor Activity Slow  Appearance/Hygiene Improved  Behavior Characteristics Anxious;Calm  Mood Depressed  Thought Process  Coherency WDL  Content WDL  Delusions WDL  Perception WDL  Hallucination None reported or observed  Judgment WDL  Confusion WDL  Danger to Self  Current suicidal ideation? Denies  Agreement Not to Harm Self Yes  Description of Agreement verbal  Danger to Others  Danger to Others None reported or observed

## 2023-11-14 NOTE — Progress Notes (Signed)
   11/14/23 2008  Psych Admission Type (Psych Patients Only)  Admission Status Voluntary  Psychosocial Assessment  Patient Complaints Anxiety  Eye Contact Brief  Facial Expression Sad;Anxious  Affect Depressed  Speech Logical/coherent  Interaction Assertive  Motor Activity Slow  Appearance/Hygiene Unremarkable  Behavior Characteristics Cooperative;Anxious  Mood Depressed  Thought Process  Coherency WDL  Content WDL  Delusions None reported or observed  Perception WDL  Hallucination None reported or observed  Judgment Impaired  Confusion WDL  Danger to Self  Current suicidal ideation? Denies  Agreement Not to Harm Self Yes  Description of Agreement verbal  Danger to Others  Danger to Others None reported or observed

## 2023-11-14 NOTE — Group Note (Signed)
 Date:  11/14/2023 Time:  8:46 PM  Group Topic/Focus:  Making Healthy Choices:   The focus of this group is to help patients identify negative/unhealthy choices they were using prior to admission and identify positive/healthier coping strategies to replace them upon discharge. Personal Choices and Values:   The focus of this group is to help patients assess and explore the importance of values in their lives, how their values affect their decisions, how they express their values and what opposes their expression. Self Care:   The focus of this group is to help patients understand the importance of self-care in order to improve or restore emotional, physical, spiritual, interpersonal, and financial health.    Participation Level:  Active  Participation Quality:  Appropriate and Attentive  Affect:  Appropriate  Cognitive:  Alert, Appropriate, and Oriented  Insight: Appropriate and Good  Engagement in Group:  Engaged  Modes of Intervention:  Discussion and Support  Additional Comments:  N/A  Louis Newton 11/14/2023, 8:46 PM
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
## 2023-11-14 NOTE — Plan of Care (Signed)
  Problem: Education: Goal: Emotional status will improve Outcome: Progressing Goal: Mental status will improve 11/14/2023 2010 by Zachary Titus BIRCH, RN Outcome: Progressing 11/14/2023 1741 by Zachary Titus BIRCH, RN Outcome: Progressing Goal: Verbalization of understanding the information provided will improve Outcome: Progressing   Problem: Activity: Goal: Interest or engagement in activities will improve 11/14/2023 2010 by Zachary Titus BIRCH, RN Outcome: Progressing 11/14/2023 1741 by Zachary Titus BIRCH, RN Outcome: Progressing

## 2023-11-14 NOTE — Group Note (Signed)
 Date:  11/14/2023 Time:  5:06 PM  Group Topic/Focus:  Diagnosis Education:   The focus of this group is to discuss the major disorders that patients maybe diagnosed with.  Group discusses the importance of knowing what one's diagnosis is so that one can understand treatment and better advocate for oneself.    Participation Level:  Active  Participation Quality:  Appropriate  Affect:  Appropriate  Cognitive:  Appropriate  Insight: Appropriate  Engagement in Group:  Engaged  Modes of Intervention:  Activity  Additional Comments:    Camellia [name] Ehsan Corvin 11/14/2023, 5:06 PM

## 2023-11-14 NOTE — Progress Notes (Signed)
 [facility] MD Progress Note  11/14/2023 4:57 PM ESTON HESLIN  MRN:  [id number]   Subjective:  Chart reviewed, case discussed in multidisciplinary meeting, patient seen during rounds.   Per H&P: Patient is 38 year old male admitted to the inpatient unit for stabilization and monitoring. Patient appears anxious on exam. Interacting well with peers and staff on the unit. Patient carries the previous diagnosis of bipolar 1 disorder Patient appeared anxious and restless during interview. Patient with pressured speech, but not elevated tone of voice. Patient was cooperative with assessment.   Patient admitted to inpatient unit with diagnosis of bipolar 1 disorder. Patient was started on abilify  5 mg daily, trazodone  50mg  PRN at night for sleep and hydroxyzine  25mg  three time daily PRN for anxiety.   Today on exam, patient still appeared anxious and restless, but reported some improvement in symptoms. We dicussed increasing abilify  to 10mg  for further symptom control to which patient was agreeable. He denied any noted side effects of the new medication. Patient was noted to have elevated blood pressure readings during his admission, we discussed potentially adding propranolol  for bp and anxiety control, but patient declined and reported hydroxyzine  worked well for him last night and he wished to keep that the same at this time. Patient denied suicidal or homicidal thoughts. He denied visual hallucinations. When asked about auditory hallucinations that were reported last night, patient stated I think that was just the door slamming, actually. He denied hearing any voices. He did report feelings of paranoia, racing thoughts, feeling on edge, and being hypervigilant. He reported he is trying to interact more with peers on the unit to distract himself. He denied any physical complaints today.    Sleep: patient reported sleeping a little more last night  Appetite:  Fair  Past Psychiatric History: see  h&P Family History:  Family History  Problem Relation Age of Onset   Kidney disease Father    Hypertension Brother    Epilepsy Brother    Heart disease Maternal Grandmother    Dementia Paternal Grandfather    Social History:  Social History   Substance and Sexual Activity  Alcohol Use Not Currently     Social History   Substance and Sexual Activity  Drug Use No    Social History   Socioeconomic History   Marital status: Single    Spouse name: Not on file   Number of children: 1   Years of education: Not on file   Highest education level: High school graduate  Occupational History   Not on file  Tobacco Use   Smoking status: Never    Passive exposure: Never   Smokeless tobacco: Never  Vaping Use   Vaping status: Never Used  Substance and Sexual Activity   Alcohol use: Not Currently   Drug use: No   Sexual activity: Yes    Birth control/protection: None  Other Topics Concern   Not on file  Social History Narrative   1 step daughter    Social Drivers of Corporate investment banker Strain: Not on file  Food Insecurity: No Food Insecurity (11/12/2023)   Hunger Vital Sign    Worried About Running Out of Food in the Last Year: Never true    Ran Out of Food in the Last Year: Never true  Transportation Needs: No Transportation Needs (11/12/2023)   PRAPARE - Administrator, Civil Service (Medical): No    Lack of Transportation (Non-Medical): No  Physical Activity: Not on  file  Stress: Not on file  Social Connections: Not on file   Past Medical History:  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 No pertinent surgical history.  Current Medications: Current Facility-Administered Medications  Medication Dose Route Frequency Provider Last Rate Last Admin    acetaminophen  (TYLENOL ) tablet 650 mg  650 mg Oral Q6H PRN Chenay Nesmith B, NP       alum & mag hydroxide-simeth (MAALOX/MYLANTA) 200-200-20 MG/5ML suspension 30 mL  30 mL Oral Q4H PRN Timofey Carandang B, NP       [START ON 11/15/2023] ARIPiprazole  (ABILIFY ) tablet 10 mg  10 mg Oral Daily Meyer Dockery B, NP       haloperidol  (HALDOL ) tablet 5 mg  5 mg Oral TID PRN Shivangi Lutz B, NP       And   diphenhydrAMINE  (BENADRYL ) capsule 50 mg  50 mg Oral TID PRN Yarelis Ambrosino B, NP       haloperidol  lactate (HALDOL ) injection 5 mg  5 mg Intramuscular TID PRN Minyon Billiter B, NP       And   diphenhydrAMINE  (BENADRYL ) injection 50 mg  50 mg Intramuscular TID PRN Shannen Vernon B, NP       And   LORazepam  (ATIVAN ) injection 2 mg  2 mg Intramuscular TID PRN Haadiya Frogge B, NP       haloperidol  lactate (HALDOL ) injection 10 mg  10 mg Intramuscular TID PRN Yussef Jorge B, NP       And   diphenhydrAMINE  (BENADRYL ) injection 50 mg  50 mg Intramuscular TID PRN Jazelyn Sipe B, NP       And   LORazepam  (ATIVAN ) injection 2 mg  2 mg Intramuscular TID PRN Shelita Steptoe B, NP       hydrOXYzine  (ATARAX ) tablet 25 mg  25 mg Oral TID PRN Cordale Manera B, NP   25 mg at 11/13/23 2353   magnesium  hydroxide (MILK OF MAGNESIA) suspension 30 mL  30 mL Oral Daily PRN Nilsa Macht B, NP       traZODone  (DESYREL ) tablet 50 mg  50 mg Oral QHS PRN Conall Vangorder B, NP   50 mg at 11/13/23 2114    Lab Results:  Results for orders placed or performed during the hospital encounter of 11/12/23 (from the past 48 hours)  Lipid panel     Status: Abnormal   Collection Time: 11/14/23  8:36 AM  Result Value Ref Range   Cholesterol 171 0 - 200 mg/dL   Triglycerides 73 <849 mg/dL   HDL 48 >59 mg/dL   Total CHOL/HDL Ratio 3.6 RATIO   VLDL 15 0 - 40 mg/dL   LDL Cholesterol 891 (H) 0 - 99 mg/dL    Comment:        Total Cholesterol/HDL:CHD Risk Coronary Heart Disease Risk Table                     Men   Women  1/2 Average Risk   3.4   3.3   Average Risk       5.0   4.4  2 X Average Risk   9.6   7.1  3 X Average Risk  23.4   11.0        Use the calculated Patient Ratio above and the CHD Risk  Table to determine the patient's CHD Risk.        ATP III CLASSIFICATION (LDL):  <100     mg/dL   Optimal  899-870  mg/dL   Near or Above                    Optimal  130-159  mg/dL   Borderline  839-810  mg/dL   High  >809     mg/dL   Very High Performed at [facility], [address] [place], [place] [address]   Hemoglobin A1c     Status: None   Collection Time: 11/14/23  8:36 AM  Result Value Ref Range   Hgb A1c MFr Bld 4.9 4.8 - 5.6 %    Comment: (NOTE) Diagnosis of Diabetes The following HbA1c ranges recommended by the American Diabetes Association (ADA) may be used as an aid in the diagnosis of diabetes mellitus.  Hemoglobin             Suggested A1C NGSP%              Diagnosis  <5.7                   Non Diabetic  5.7-6.4                Pre-Diabetic  >6.4                   Diabetic  <7.0                   Glycemic control for                       adults with diabetes.     Mean Plasma Glucose 93.93 mg/dL    Comment: Performed at [facility], [address], [place], [place] [address]    Blood Alcohol level:  Lab Results  Component Value Date   [facility] <15 11/12/2023   ETH <10 05/07/2023    Metabolic Disorder Labs: Lab Results  Component Value Date   HGBA1C 4.9 11/14/2023   MPG 93.93 11/14/2023   MPG 102.54 05/11/2023   Lab Results  Component Value Date   PROLACTIN 18.4 (H) 09/11/2015   Lab Results  Component Value Date   CHOL 171 11/14/2023   TRIG 73 11/14/2023   HDL 48 11/14/2023   CHOLHDL 3.6 11/14/2023   VLDL 15 11/14/2023   LDLCALC 108 (H) 11/14/2023   LDLCALC 93 05/17/2023    Physical Findings: AIMS:  , ,  ,  ,    CIWA:    COWS:      Psychiatric Specialty Exam:  Presentation  General Appearance:  Appropriate for Environment  Eye  Contact: Fair  Speech: Other (comment) (Increased rate)  Speech Volume: Normal    Mood and Affect  Mood: Depressed; Anxious  Affect: Depressed   Thought Process  Thought Processes: Coherent  Descriptions of Associations:Intact  Orientation:Full (Time, Place and Person)  Thought Content:Paranoid Ideation  Hallucinations:Hallucinations: Auditory Description of Auditory Hallucinations: reported hearing what sounded like gun shots today  Ideas of Reference:Paranoia  Suicidal Thoughts:Suicidal Thoughts: No  Homicidal Thoughts:Homicidal Thoughts: No   Sensorium  Memory: Immediate Fair; Recent Poor; Remote Fair  Judgment: Fair  Insight: Fair   Art therapist  Concentration: Poor  Attention Span: Poor  Recall: Poor  Fund of Knowledge: Fair  Language: Fair   Psychomotor Activity  Psychomotor Activity:No data recorded Musculoskeletal: Strength &  Muscle Tone: within normal limits Gait & Station: normal Assets  Assets: Manufacturing systems engineer; Desire for Improvement; Social Support; Housing; Health and safety inspector    Physical Exam: Physical Exam HENT:     Head: Normocephalic.  Neurological:     Mental Status: He is alert and oriented to person, place, and time.    Review of Systems  Constitutional:  Negative for fever.  Respiratory:  Negative for sputum production.   Cardiovascular:  Negative for chest pain.  Gastrointestinal:  Negative for diarrhea, nausea and vomiting.  Neurological:  Negative for headaches.  Psychiatric/Behavioral:  Negative for substance abuse and suicidal ideas. The patient is nervous/anxious and has insomnia.    Blood pressure 130/89, pulse 85, temperature 98.1 F (36.7 C), resp. rate 18, height 5' 9 (1.753 m), weight 68 kg, SpO2 100%. Body mass index is 22.15 kg/m.  Diagnosis: Principal Problem:   Bipolar 1 disorder (HCC)   PLAN: Safety and Monitoring:  -- Voluntary admission to inpatient  psychiatric unit for safety, stabilization and treatment  -- Daily contact with patient to assess and evaluate symptoms and progress in treatment  -- Patient's case to be discussed in multi-disciplinary team meeting  -- Observation Level : q15 minute checks  -- Vital signs:  q12 hours  -- Precautions: suicide, elopement, and assault -- Encouraged patient to participate in unit milieu and in scheduled group therapies  2. Psychiatric Diagnoses and Treatment:  Bipolar 1 disorder  -Increase abilify  from 5mg  daily to 10mg  daily to target mood stabilization, racing thoughts, anxiety, paranoia and reported auditory hallucinations on admission and prior to admission. We discussed risks and benefits of medications and side effects to look for such as stiffness, abnormal muscle movements or movement changes that differ from patient baseline. Hemoglobin A1c: 4.9     3. Medical Issues Being Addressed:  Elevated blood pressure reading- patient declined propranolol  being added to medication regimen at this time. Will continue to monitor   4. Discharge Planning:   -- Social work and case management to assist with discharge planning and identification of hospital follow-up needs prior to discharge  -- Estimated LOS: 3-4 days  Zelda Sharps, NP

## 2023-11-14 NOTE — Progress Notes (Signed)
   11/14/23 0915  Psych Admission Type (Psych Patients Only)  Admission Status Voluntary  Psychosocial Assessment  Patient Complaints Anxiety  Eye Contact Brief  Facial Expression Sad;Anxious  Affect Depressed  Speech Logical/coherent  Interaction Assertive  Motor Activity Slow  Appearance/Hygiene Unremarkable  Behavior Characteristics Cooperative;Anxious  Mood Depressed  Thought Process  Coherency WDL  Content WDL  Delusions None reported or observed  Perception WDL  Hallucination None reported or observed  Judgment Impaired  Confusion WDL  Danger to Self  Current suicidal ideation? Denies  Agreement Not to Harm Self Yes  Description of Agreement verbal  Danger to Others  Danger to Others None reported or observed

## 2023-11-14 NOTE — Group Note (Signed)
 LCSW Group Therapy Note   Group Date: 11/14/2023 Start Time: 1355 End Time: 1445   Type of Therapy and Topic:  Group Therapy: Change and Accountability  Participation Level:  Did Not Attend    Summary of Patient Progress:  The patient did not attend group.    Roselyn GORMAN Lento, LCSWA 11/14/2023  4:19 PM

## 2023-11-14 NOTE — Plan of Care (Signed)
   Problem: Education: Goal: Mental status will improve Outcome: Progressing   Problem: Activity: Goal: Interest or engagement in activities will improve Outcome: Progressing

## 2023-11-15 DIAGNOSIS — F319 Bipolar disorder, unspecified: Secondary | ICD-10-CM

## 2023-11-15 NOTE — Group Note (Signed)
 LCSW Group Therapy Note  Group Date: 11/15/2023 Start Time: 1300 End Time: 1400   Type of Therapy and Topic:  Group Therapy - How To Cope with Nervousness about Discharge   Participation Level:  Did Not Attend   Description of Group This process group involved identification of patients' feelings about discharge. Some of them are scheduled to be discharged soon, while others are new admissions, but each of them was asked to share thoughts and feelings surrounding discharge from the hospital. One common theme was that they are excited at the prospect of going home, while another was that many of them are apprehensive about sharing why they were hospitalized. Patients were given the opportunity to discuss these feelings with their peers in preparation for discharge.  Therapeutic Goals  Patient will identify their overall feelings about pending discharge. Patient will think about how they might proactively address issues that they believe will once again arise once they get home (i.e. with parents). Patients will participate in discussion about having hope for change.   Summary of Patient Progress:   X   Therapeutic Modalities Cognitive Behavioral Therapy   Sherryle JINNY Margo, LCSW 11/15/2023  3:16 PM

## 2023-11-15 NOTE — Group Note (Signed)
 Recreation Therapy Group Note   Group Topic:Health and Wellness  Group Date: 11/15/2023 Start Time: 1100 End Time: 1135 Facilitators: Celestia Jeoffrey BRAVO, LRT, CTRS Location: Courtyard  Group Description: [employer]. LRT and patients played games of basketball, drew with chalk, and played corn hole while outside in the courtyard while getting fresh air and sunlight. Music was being played in the background. LRT and peers conversed about different games they have played before, what they do in their free time and anything else that is on their minds. LRT encouraged pts to drink water after being outside, sweating and getting their heart rate up.  Goal Area(s) Addressed: Patient will build on frustration tolerance skills. Patients will partake in a competitive play game with peers. Patients will gain knowledge of new leisure interest/hobby.    Affect/Mood: Appropriate and Flat   Participation Level: Active   Participation Quality: Independent   Behavior: Appropriate and Cooperative   Speech/Thought Process: Coherent   Insight: Fair   Judgement: Fair    Modes of Intervention: Activity and Socialization   Patient Response to Interventions:  Attentive and Receptive   Education Outcome:  Acknowledges education   Clinical Observations/Individualized Feedback: Louis Newton was active in their participation of session activities and group discussion. Pt interacted well with LRT and peers duration of session.    Plan: Continue to engage patient in RT group sessions 2-3x/week.   Jeoffrey BRAVO Celestia, LRT, CTRS 11/15/2023 11:49 AM

## 2023-11-15 NOTE — [facility] Suicide Risk Assessment (Signed)
 [facility] INPATIENT:  Family/Significant Other Suicide Prevention Education  Suicide Prevention Education:  Contact Attempts: Ella/cousin [phone]), has been identified by the patient as the family member/significant other with whom the patient will be residing, and identified as the person(s) who will aid the patient in the event of a mental health crisis.  With written consent from the patient, two attempts were made to provide suicide prevention education, prior to and/or following the patient's discharge.  We were unsuccessful in providing suicide prevention education.  A suicide education pamphlet was given to the patient to share with family/significant other.  Date and time of first attempt: 11/15/23 at 3:16 PM Date and time of second attempt: Second attempt needed  Contact was unable to be established and there was no voicemail box set up to leave message.   Nadara JONELLE Fam 11/15/2023, 3:50 PM

## 2023-11-15 NOTE — Progress Notes (Signed)
 Pt calm and pleasant during assessment denying SI/HI/AVH. Pt observed interacting appropriately with staff and peers on the unit. Pt compliant with medication administration per MD orders. Pt given education, support, and encouragement to be active in his treatment plan. Pt being monitored Q 15 minutes for safety per unit protocol, remains safe on the unit

## 2023-11-15 NOTE — Plan of Care (Signed)
   Problem: Education: Goal: Mental status will improve Outcome: Progressing Goal: Verbalization of understanding the information provided will improve Outcome: Progressing   Problem: Activity: Goal: Interest or engagement in activities will improve Outcome: Progressing Goal: Sleeping patterns will improve Outcome: Progressing   Problem: Coping: Goal: Ability to verbalize frustrations and anger appropriately will improve Outcome: Progressing Goal: Ability to demonstrate self-control will improve Outcome: Progressing

## 2023-11-15 NOTE — Progress Notes (Signed)
   11/15/23 1000  Psych Admission Type (Psych Patients Only)  Admission Status Voluntary  Psychosocial Assessment  Patient Complaints None  Eye Contact Fair  Facial Expression Anxious  Affect Depressed  Speech Logical/coherent  Interaction Assertive  Motor Activity Slow  Appearance/Hygiene Unremarkable  Behavior Characteristics Cooperative  Mood Pleasant  Thought Process  Coherency WDL  Content WDL  Delusions None reported or observed  Perception WDL  Hallucination None reported or observed  Judgment Impaired  Confusion None  Danger to Self  Current suicidal ideation? Denies  Agreement Not to Harm Self Yes  Description of Agreement verbal  Danger to Others  Danger to Others None reported or observed

## 2023-11-15 NOTE — Group Note (Signed)
 Recreation Therapy Group Note   Group Topic:Other  Group Date: 11/15/2023 Start Time: 1455 End Time: 1540 Facilitators: Celestia Jeoffrey FORBES ARTICE, CTRS Location: Craft Room  Activity Description/Intervention: Therapeutic Drumming. Patients with peers and staff were given the opportunity to engage in a leader facilitated [employer] Group Empowerment Drumming Circle with staff from the [employer], in partnership with [employer]. Teaching laboratory technician and trained [employer], Norleen Mon leading with LRT observing and documenting intervention and pt response. This evidenced-based practice targets 7 areas of health and wellbeing in the human experience including: stress-reduction, exercise, self-expression, camaraderie/support, nurturing, spirituality, and music-making (leisure).    Goal Area(s) Addresses:  Patient will engage in pro-social way in music group.  Patient will follow directions of drum leader on the first prompt. Patient will demonstrate no behavioral issues during group.  Patient will identify if a reduction in stress level occurs as a result of participation in therapeutic drum circle.     Affect/Mood: Appropriate   Participation Level: Active and Engaged   Participation Quality: Independent   Behavior: Appropriate, Calm, and Cooperative   Speech/Thought Process: Coherent   Insight: Good   Judgement: Good   Modes of Intervention: Music   Patient Response to Interventions:  Attentive, Engaged, and Receptive   Education Outcome:  Acknowledges education   Clinical Observations/Individualized Feedback: Viraat was active in their participation of session activities and group discussion.    Plan: Continue to engage patient in RT group sessions 2-3x/week.   Jeoffrey FORBES Celestia, LRT, CTRS 11/15/2023 4:52 PM

## 2023-11-15 NOTE — BH IP Treatment Plan (Signed)
 Interdisciplinary Treatment and Diagnostic Plan Update  11/15/2023 Time of Session: 10:51 Louis Newton MRN: [id number]  Principal Diagnosis: Bipolar 1 disorder [facility])  Secondary Diagnoses: Principal Problem:   Bipolar 1 disorder (HCC)   Current Medications:  Current Facility-Administered Medications  Medication Dose Route Frequency Provider Last Rate Last Admin   acetaminophen  (TYLENOL ) tablet 650 mg  650 mg Oral Q6H PRN Smith, Annie B, NP       alum & mag hydroxide-simeth (MAALOX/MYLANTA) 200-200-20 MG/5ML suspension 30 mL  30 mL Oral Q4H PRN Smith, Annie B, NP   30 mL at 11/15/23 1034   ARIPiprazole  (ABILIFY ) tablet 10 mg  10 mg Oral Daily Smith, Annie B, NP   10 mg at 11/15/23 0753   haloperidol  (HALDOL ) tablet 5 mg  5 mg Oral TID PRN Smith, Annie B, NP       And   diphenhydrAMINE  (BENADRYL ) capsule 50 mg  50 mg Oral TID PRN Smith, Annie B, NP       haloperidol  lactate (HALDOL ) injection 5 mg  5 mg Intramuscular TID PRN Smith, Annie B, NP       And   diphenhydrAMINE  (BENADRYL ) injection 50 mg  50 mg Intramuscular TID PRN Smith, Annie B, NP       And   LORazepam  (ATIVAN ) injection 2 mg  2 mg Intramuscular TID PRN Smith, Annie B, NP       haloperidol  lactate (HALDOL ) injection 10 mg  10 mg Intramuscular TID PRN Smith, Annie B, NP       And   diphenhydrAMINE  (BENADRYL ) injection 50 mg  50 mg Intramuscular TID PRN Smith, Annie B, NP       And   LORazepam  (ATIVAN ) injection 2 mg  2 mg Intramuscular TID PRN Smith, Annie B, NP       hydrOXYzine  (ATARAX ) tablet 25 mg  25 mg Oral TID PRN Smith, Annie B, NP   25 mg at 11/13/23 2353   magnesium  hydroxide (MILK OF MAGNESIA) suspension 30 mL  30 mL Oral Daily PRN Smith, Annie B, NP       traZODone  (DESYREL ) tablet 50 mg  50 mg Oral QHS PRN Smith, Annie B, NP   50 mg at 11/14/23 2157   PTA Medications: Medications Prior to Admission  Medication Sig Dispense Refill Last Dose/Taking   FLUoxetine  (PROZAC ) 10 MG capsule Take 1 capsule (10 mg  total) by mouth daily. 30 capsule 2 11/11/2023   traZODone  (DESYREL ) 50 MG tablet Take 1-2 tablets (50-100 mg total) by mouth at bedtime as needed for sleep. 30 tablet 2 11/11/2023    Patient Stressors:    Patient Strengths:    Treatment Modalities: Medication Management, Group therapy, Case management,  1 to 1 session with clinician, Psychoeducation, Recreational therapy.   Physician Treatment Plan for Primary Diagnosis: Bipolar 1 disorder (HCC) Long Term Goal(s): Improvement in symptoms so as ready for discharge   Short Term Goals: Ability to identify changes in lifestyle to reduce recurrence of condition will improve Ability to verbalize feelings will improve Ability to demonstrate self-control will improve Ability to identify and develop effective coping behaviors will improve Ability to maintain clinical measurements within normal limits will improve  Medication Management: Evaluate patient's response, side effects, and tolerance of medication regimen.  Therapeutic Interventions: 1 to 1 sessions, Unit Group sessions and Medication administration.  Evaluation of Outcomes: Progressing  Physician Treatment Plan for Secondary Diagnosis: Principal Problem:   Bipolar 1 disorder (HCC)  Long Term Goal(s): Improvement in  symptoms so as ready for discharge   Short Term Goals: Ability to identify changes in lifestyle to reduce recurrence of condition will improve Ability to verbalize feelings will improve Ability to demonstrate self-control will improve Ability to identify and develop effective coping behaviors will improve Ability to maintain clinical measurements within normal limits will improve     Medication Management: Evaluate patient's response, side effects, and tolerance of medication regimen.  Therapeutic Interventions: 1 to 1 sessions, Unit Group sessions and Medication administration.  Evaluation of Outcomes: Progressing   RN Treatment Plan for Primary Diagnosis:  Bipolar 1 disorder (HCC) Long Term Goal(s): Knowledge of disease and therapeutic regimen to maintain health will improve  Short Term Goals: Ability to remain free from injury will improve, Ability to verbalize frustration and anger appropriately will improve, Ability to demonstrate self-control, Ability to participate in decision making will improve, Ability to verbalize feelings will improve, Ability to disclose and discuss suicidal ideas, Ability to identify and develop effective coping behaviors will improve, and Compliance with prescribed medications will improve  Medication Management: RN will administer medications as ordered by provider, will assess and evaluate patient's response and provide education to patient for prescribed medication. RN will report any adverse and/or side effects to prescribing provider.  Therapeutic Interventions: 1 on 1 counseling sessions, Psychoeducation, Medication administration, Evaluate responses to treatment, Monitor vital signs and CBGs as ordered, Perform/monitor CIWA, COWS, AIMS and Fall Risk screenings as ordered, Perform wound care treatments as ordered.  Evaluation of Outcomes: Progressing   LCSW Treatment Plan for Primary Diagnosis: Bipolar 1 disorder (HCC) Long Term Goal(s): Safe transition to appropriate next level of care at discharge, Engage patient in therapeutic group addressing interpersonal concerns.  Short Term Goals: Engage patient in aftercare planning with referrals and resources, Increase social support, Increase ability to appropriately verbalize feelings, Increase emotional regulation, Facilitate acceptance of mental health diagnosis and concerns, and Increase skills for wellness and recovery  Therapeutic Interventions: Assess for all discharge needs, 1 to 1 time with Social worker, Explore available resources and support systems, Assess for adequacy in community support network, Educate family and significant other(s) on suicide  prevention, Complete Psychosocial Assessment, Interpersonal group therapy.  Evaluation of Outcomes: Progressing   Progress in Treatment: Attending groups: Yes. Participating in groups: Yes. Taking medication as prescribed: Yes. Toleration medication: Yes. Family/Significant other contact made: No, will contact:  when given permission.  Patient understands diagnosis: Yes. Discussing patient identified problems/goals with staff: Yes. Medical problems stabilized or resolved: Yes. Denies suicidal/homicidal ideation: Yes. Issues/concerns per patient self-inventory: No. Other: none.  New problem(s) identified: No, Describe:  none identified.   New Short Term/Long Term Goal(s): medication management for mood stabilization; elimination of SI thoughts; development of comprehensive mental wellness plan.  Patient Goals:  To get better and work on myself.   Discharge Plan or Barriers: CSW will assist pt with development of an appropriate aftercare/discharge plan.   Reason for Continuation of Hospitalization: Anxiety Hallucinations Medication stabilization  Estimated Length of Stay: 1-7 days  Last 3 [place] Suicide Severity Risk Score: Flowsheet Row Admission (Current) from 11/12/2023 in [facility]PAT[facility]AL MEDICINE Most recent reading at 11/12/2023  6:00 PM ED from 11/12/2023 in [facility] Emergency Department at [facility] Most recent reading at 11/12/2023  9:22 AM Admission (Discharged) from 05/07/2023 in [facility] INPATIENT BEHAVIORAL MEDICINE Most recent reading at 05/07/2023  6:00 PM  C-SSRS RISK CATEGORY No Risk No Risk No Risk    Last PHQ 2/9 Scores:  11/10/2023    2:59 PM 07/05/2023    2:54 PM 05/17/2023    8:16 AM  Depression screen PHQ 2/9  Decreased Interest 0 0 3  Down, Depressed, Hopeless 2 0 3  PHQ - 2 Score 2 0 6  Altered sleeping 1 0 3  Tired, decreased energy 0 0 0  Change in appetite 2 0 0  Feeling bad or failure about yourself  3 0 0  Trouble  concentrating 1 0 0  Moving slowly or fidgety/restless 0 0 0  Suicidal thoughts 2 0 0  PHQ-9 Score 11 0 9  Difficult doing work/chores Somewhat difficult Not difficult at all Not difficult at all    Scribe for Treatment Team: Nadara JONELLE Fam, LCSW 11/15/2023 11:11 AM

## 2023-11-15 NOTE — Group Note (Signed)
 Date:  11/15/2023 Time:  3:34 PM  Group Topic/Focus:  Goals Group:   The focus of this group is to help patients establish daily goals to achieve during treatment and discuss how the patient can incorporate goal setting into their daily lives to aide in recovery.    Participation Level:  Did Not Attend  P Ulonda Klosowski CHRISTELLA Hover 11/15/2023, 3:34 PM

## 2023-11-15 NOTE — Progress Notes (Signed)
 [facility] MD Progress Note  11/15/2023 11:42 AM Louis Newton  MRN:  [id number]   Subjective:  Chart reviewed, case discussed in multidisciplinary meeting, patient seen during rounds.   Per H&P: Patient is 38 year old male admitted to the inpatient unit for stabilization and monitoring. Patient appears anxious on exam. Interacting well with peers and staff on the unit. Patient carries the previous diagnosis of bipolar 1 disorder Patient appeared anxious and restless during interview. Patient with pressured speech, but not elevated tone of voice. Patient was cooperative with assessment.   Patient admitted to inpatient unit with diagnosis of bipolar 1 disorder. Patient was started on abilify  5 mg daily, trazodone  50mg  PRN at night for sleep and hydroxyzine  25mg  three time daily PRN for anxiety.   9/22: Patient seen for follow up states he got sick watching stuff on line and started having SI and could not relax. Notes he has decreased appetite. Had been off of medications for awhile because he had started to feel better. Notes that he felt groggy on meds so discontinued in April. Feels supported at home. Lives with mom and is employed. Reports slep is fair today. Appetite is stable. Is agreeable to outpatient follow up. No Behavioral PRNS over night. Denies SI/HI/AVH. Orientation was intact. Feels the paranoia is improving is uncertain what he was paranoid about. States sometimes his anxiety just makes him paranoid. Again discussed BP and anxiety, he declined further medication management. Denies adverse effects of medications, voices no concerns or complaints.   9/21 Today on exam, patient still appeared anxious and restless, but reported some improvement in symptoms. We dicussed increasing abilify  to 10mg  for further symptom control to which patient was agreeable. He denied any noted side effects of the new medication. Patient was noted to have elevated blood pressure readings during his admission, we  discussed potentially adding propranolol  for bp and anxiety control, but patient declined and reported hydroxyzine  worked well for him last night and he wished to keep that the same at this time. Patient denied suicidal or homicidal thoughts. He denied visual hallucinations. When asked about auditory hallucinations that were reported last night, patient stated I think that was just the door slamming, actually. He denied hearing any voices. He did report feelings of paranoia, racing thoughts, feeling on edge, and being hypervigilant. He reported he is trying to interact more with peers on the unit to distract himself. He denied any physical complaints today.    Sleep: patient reported sleeping a little more last night  Appetite:  Fair  Past Psychiatric History: see h&P Family History:  Family History  Problem Relation Age of Onset   Kidney disease Father    Hypertension Brother    Epilepsy Brother    Heart disease Maternal Grandmother    Dementia Paternal Grandfather    Social History:  Social History   Substance and Sexual Activity  Alcohol Use Not Currently     Social History   Substance and Sexual Activity  Drug Use No    Social History   Socioeconomic History   Marital status: Single    Spouse name: Not on file   Number of children: 1   Years of education: Not on file   Highest education level: High school graduate  Occupational History   Not on file  Tobacco Use   Smoking status: Never    Passive exposure: Never   Smokeless tobacco: Never  Vaping Use   Vaping status: Never Used  Substance and Sexual  Activity   Alcohol use: Not Currently   Drug use: No   Sexual activity: Yes    Birth control/protection: None  Other Topics Concern   Not on file  Social History Narrative   1 step daughter    Social Drivers of Corporate investment banker Strain: Not on file  Food Insecurity: No Food Insecurity (11/12/2023)   Hunger Vital Sign    Worried About Running Out  of Food in the Last Year: Never true    Ran Out of Food in the Last Year: Never true  Transportation Needs: No Transportation Needs (11/12/2023)   PRAPARE - Administrator, Civil Service (Medical): No    Lack of Transportation (Non-Medical): No  Physical Activity: Not on file  Stress: Not on file  Social Connections: Not on file   Past Medical History:  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 No pertinent surgical history.  Current Medications: Current Facility-Administered Medications  Medication Dose Route Frequency Provider Last Rate Last Admin   acetaminophen  (TYLENOL ) tablet 650 mg  650 mg Oral Q6H PRN Smith, Annie B, NP       alum & mag hydroxide-simeth (MAALOX/MYLANTA) 200-200-20 MG/5ML suspension 30 mL  30 mL Oral Q4H PRN Smith, Annie B, NP   30 mL at 11/15/23 1034   ARIPiprazole  (ABILIFY ) tablet 10 mg  10 mg Oral Daily Smith, Annie B, NP   10 mg at 11/15/23 9246   haloperidol  (HALDOL ) tablet 5 mg  5 mg Oral TID PRN Smith, Annie B, NP       And   diphenhydrAMINE  (BENADRYL ) capsule 50 mg  50 mg Oral TID PRN Smith, Annie B, NP       haloperidol  lactate (HALDOL ) injection 5 mg  5 mg Intramuscular TID PRN Smith, Annie B, NP       And   diphenhydrAMINE  (BENADRYL ) injection 50 mg  50 mg Intramuscular TID PRN Smith, Annie B, NP       And   LORazepam  (ATIVAN ) injection 2 mg  2 mg Intramuscular TID PRN Smith, Annie B, NP       haloperidol  lactate (HALDOL ) injection 10 mg  10 mg Intramuscular TID PRN Smith, Annie B, NP       And   diphenhydrAMINE  (BENADRYL ) injection 50 mg  50 mg Intramuscular TID PRN Smith, Annie B, NP       And   LORazepam  (ATIVAN ) injection 2 mg  2 mg Intramuscular TID PRN Smith, Annie B, NP       hydrOXYzine  (ATARAX ) tablet 25 mg  25 mg Oral TID PRN  Smith, Annie B, NP   25 mg at 11/13/23 2353   magnesium  hydroxide (MILK OF MAGNESIA) suspension 30 mL  30 mL Oral Daily PRN Smith, Annie B, NP       traZODone  (DESYREL ) tablet 50 mg  50 mg Oral QHS PRN Smith, Annie B, NP   50 mg at 11/14/23 2157    Lab Results:  Results for orders placed or performed during the hospital encounter of 11/12/23 (from the past 48 hours)  Lipid panel     Status: Abnormal   Collection Time: 11/14/23  8:36 AM  Result Value Ref Range   Cholesterol 171 0 - 200 mg/dL   Triglycerides 73 <849 mg/dL   HDL  48 >40 mg/dL   Total CHOL/HDL Ratio 3.6 RATIO   VLDL 15 0 - 40 mg/dL   LDL Cholesterol 891 (H) 0 - 99 mg/dL    Comment:        Total Cholesterol/HDL:CHD Risk Coronary Heart Disease Risk Table                     Men   Women  1/2 Average Risk   3.4   3.3  Average Risk       5.0   4.4  2 X Average Risk   9.6   7.1  3 X Average Risk  23.4   11.0        Use the calculated Patient Ratio above and the CHD Risk Table to determine the patient's CHD Risk.        ATP III CLASSIFICATION (LDL):  <100     mg/dL   Optimal  899-870  mg/dL   Near or Above                    Optimal  130-159  mg/dL   Borderline  839-810  mg/dL   High  >809     mg/dL   Very High Performed at [facility], [address] [place], [place] [address]   Hemoglobin A1c     Status: None   Collection Time: 11/14/23  8:36 AM  Result Value Ref Range   Hgb A1c MFr Bld 4.9 4.8 - 5.6 %    Comment: (NOTE) Diagnosis of Diabetes The following HbA1c ranges recommended by the American Diabetes Association (ADA) may be used as an aid in the diagnosis of diabetes mellitus.  Hemoglobin             Suggested A1C NGSP%              Diagnosis  <5.7                   Non Diabetic  5.7-6.4                Pre-Diabetic  >6.4                   Diabetic  <7.0                   Glycemic control for                       adults with diabetes.     Mean Plasma Glucose 93.93 mg/dL     Comment: Performed at [facility], [address], [place], [place] [address]    Blood Alcohol level:  Lab Results  Component Value Date   [facility] <15 11/12/2023   ETH <10 05/07/2023    Metabolic Disorder Labs: Lab Results  Component Value Date   HGBA1C 4.9 11/14/2023   MPG 93.93 11/14/2023   MPG 102.54 05/11/2023   Lab Results  Component Value Date   PROLACTIN 18.4 (H) 09/11/2015   Lab Results  Component Value Date   CHOL 171 11/14/2023   TRIG 73 11/14/2023   HDL 48 11/14/2023   CHOLHDL 3.6 11/14/2023   VLDL 15 11/14/2023   LDLCALC 108 (H) 11/14/2023   LDLCALC 93 05/17/2023    Physical Findings: AIMS:  , ,  ,  ,    CIWA:    COWS:      Psychiatric Specialty Exam:  Presentation  General Appearance:  Appropriate for Environment  Eye Contact: Fair  Speech: Other (comment) (Increased rate)  Speech Volume: Normal    Mood and Affect  Mood: Depressed; Anxious  Affect: Depressed   Thought Process  Thought Processes: Coherent  Descriptions of Associations:Intact  Orientation:Full (Time, Place and Person)  Thought Content:Paranoid Ideation  Hallucinations:No data recorded  Ideas of Reference:Paranoia  Suicidal Thoughts:No data recorded  Homicidal Thoughts:No data recorded   Sensorium  Memory: Immediate Fair; Recent Poor; Remote Fair  Judgment: Fair  Insight: Fair   Art therapist  Concentration: Poor  Attention Span: Poor  Recall: Poor  Fund of Knowledge: Fair  Language: Fair   Psychomotor Activity  Psychomotor Activity:No data recorded Musculoskeletal: Strength & Muscle Tone: within normal limits Gait & Station: normal Assets  Assets: Manufacturing systems engineer; Desire for Improvement; Social Support; Housing; Health and safety inspector    Physical Exam: Physical Exam HENT:     Head: Normocephalic.  Neurological:     Mental Status: He is alert and oriented to person, place, and time.    Review  of Systems  Constitutional:  Negative for fever.  Respiratory:  Negative for sputum production.   Cardiovascular:  Negative for chest pain.  Gastrointestinal:  Negative for diarrhea, nausea and vomiting.  Neurological:  Negative for headaches.  Psychiatric/Behavioral:  Negative for substance abuse and suicidal ideas. The patient is nervous/anxious. The patient does not have insomnia.    Blood pressure (!) 127/95, pulse (!) 101, temperature 98.1 F (36.7 C), resp. rate 18, height 5' 9 (1.753 m), weight 68 kg, SpO2 100%. Body mass index is 22.15 kg/m.  Diagnosis: Principal Problem:   Bipolar 1 disorder (HCC)   PLAN: Safety and Monitoring:  -- Voluntary admission to inpatient psychiatric unit for safety, stabilization and treatment  -- Daily contact with patient to assess and evaluate symptoms and progress in treatment  -- Patient's case to be discussed in multi-disciplinary team meeting  -- Observation Level : q15 minute checks  -- Vital signs:  q12 hours  -- Precautions: suicide, elopement, and assault -- Encouraged patient to participate in unit milieu and in scheduled group therapies  2. Psychiatric Diagnoses and Treatment:  Bipolar 1 disorder  -Contiue abilify  10mg  daily to target mood stabilization, racing thoughts, anxiety, paranoia and reported auditory hallucinations on admission and prior to admission. We discussed risks and benefits of medications and side effects to look for such as stiffness, abnormal muscle movements or movement changes that differ from patient baseline. Hemoglobin A1c: 4.9     3. Medical Issues Being Addressed:  Elevated blood pressure reading- patient declined propranolol  being added to medication regimen at this time. Will continue to monitor   4. Discharge Planning:   -- Social work and case management to assist with discharge planning and identification of hospital follow-up needs prior to discharge  -- Estimated LOS: 3-4 days  Zelda Sharps,  NP

## 2023-11-15 NOTE — Group Note (Signed)
 Date:  11/15/2023 Time:  8:55 PM  Group Topic/Focus:  Wrap-Up Group:   The focus of this group is to help patients review their daily goal of treatment and discuss progress on daily workbooks.    Participation Level:  Active  Participation Quality:  Appropriate  Affect:  Appropriate  Cognitive:  Appropriate  Insight: Appropriate  Engagement in Group:  Engaged  Modes of Intervention:  Discussion  Additional Comments:    Leigh VEAR Pais 11/15/2023, 8:55 PM

## 2023-11-16 DIAGNOSIS — F319 Bipolar disorder, unspecified: Secondary | ICD-10-CM | POA: Diagnosis not present

## 2023-11-16 NOTE — Group Note (Signed)
 Recreation Therapy Group Note   Group Topic:Healthy Support Systems  Group Date: 11/16/2023 Start Time: 1530 End Time: 1620 Facilitators: Celestia Jeoffrey FORBES ARTICE, CTRS Location: Craft Room  Group Description: Straw Bridge. In groups or individually, patients were given 10 plastic drinking straws and an equal length of masking tape. Using the materials provided, patients were instructed to build a free-standing bridge-like structure to suspend an everyday item (ex: deck of cards) off the floor or table surface. All materials were required to be used in Secondary school teacher. LRT facilitated post-activity discussion reviewing the importance of having strong and healthy support systems in our lives. LRT discussed how the people in our lives serve as the tape and the deck of cards we placed on top of our straw structure are the stressors we face in daily life. LRT and pts discussed what happens in our life when things get too heavy for us , and we don't have strong supports outside of the hospital. Pt shared 2 of their healthy supports in their life aloud in the group.   Goal Area(s) Addressed:  Patient will identify 2 healthy supports in their life. Patient will identify skills to successfully complete activity. Patient will identify correlation of this activity to life post-discharge.  Patient will build on frustration tolerance skills. Patient will increase team building and communication skills.    Affect/Mood: Appropriate and Flat   Participation Level: Active and Engaged   Participation Quality: Independent   Behavior: Calm and Cooperative   Speech/Thought Process: Coherent   Insight: Fair   Judgement: Fair    Modes of Intervention: STEM Activity   Patient Response to Interventions:  Attentive, Engaged, and Receptive   Education Outcome:  Acknowledges education   Clinical Observations/Individualized Feedback: Zaki was active in their participation of session activities and group  discussion. Pt identified my mom and brother as healthy supports.    Plan: Continue to engage patient in RT group sessions 2-3x/week.   Jeoffrey FORBES Celestia, LRT, CTRS 11/16/2023 5:08 PM

## 2023-11-16 NOTE — Progress Notes (Signed)
 Pt calm and pleasant during assessment denying SI/HI/AVH. Pt observed interacting appropriately with staff and peers on the unit. Pt compliant with medication administration per MD orders. Pt given education, support, and encouragement to be active in his treatment plan. Pt being monitored Q 15 minutes for safety per unit protocol, remains safe on the unit

## 2023-11-16 NOTE — Progress Notes (Signed)
 [facility] MD Progress Note  11/16/2023 1:40 PM Louis Newton  MRN:  [id number]   Subjective:  Chart reviewed, case discussed in multidisciplinary meeting, patient seen during rounds.   Per H&P: Patient is 38 year old male admitted to the inpatient unit for stabilization and monitoring. Patient appears anxious on exam. Interacting well with peers and staff on the unit. Patient carries the previous diagnosis of bipolar 1 disorder Patient appeared anxious and restless during interview. Patient with pressured speech, but not elevated tone of voice. Patient was cooperative with assessment.   Patient admitted to inpatient unit with diagnosis of bipolar 1 disorder. Patient was started on abilify  5 mg daily, trazodone  50mg  PRN at night for sleep and hydroxyzine  25mg  three time daily PRN for anxiety.   9/23: On follow-up patient is alert and oriented.  They are pleasant and cooperative on exam.  They deny adverse effects of medications.  They deny SI, HI, and AVH.  They voiced no concerns or complaints at this time.  They demonstrate good insight into the need for outpatient follow-up and medication compliance. No Behavioral PRNS over night.Denying paranoia today. Continue BP monitoring.   9/22: Patient seen for follow up states he got sick watching stuff on line and started having SI and could not relax. Notes he has decreased appetite. Had been off of medications for awhile because he had started to feel better. Notes that he felt groggy on meds so discontinued in April. Feels supported at home. Lives with mom and is employed. Reports slep is fair today. Appetite is stable. Is agreeable to outpatient follow up. No Behavioral PRNS over night. Denies SI/HI/AVH. Orientation was intact. Feels the paranoia is improving is uncertain what he was paranoid about. States sometimes his anxiety just makes him paranoid. Again discussed BP and anxiety, he declined further medication management. Denies adverse effects of  medications, voices no concerns or complaints.   9/21 Today on exam, patient still appeared anxious and restless, but reported some improvement in symptoms. We dicussed increasing abilify  to 10mg  for further symptom control to which patient was agreeable. He denied any noted side effects of the new medication. Patient was noted to have elevated blood pressure readings during his admission, we discussed potentially adding propranolol  for bp and anxiety control, but patient declined and reported hydroxyzine  worked well for him last night and he wished to keep that the same at this time. Patient denied suicidal or homicidal thoughts. He denied visual hallucinations. When asked about auditory hallucinations that were reported last night, patient stated I think that was just the door slamming, actually. He denied hearing any voices. He did report feelings of paranoia, racing thoughts, feeling on edge, and being hypervigilant. He reported he is trying to interact more with peers on the unit to distract himself. He denied any physical complaints today.    Sleep: patient reported sleeping a little more last night  Appetite:  Fair  Past Psychiatric History: see h&P Family History:  Family History  Problem Relation Age of Onset   Kidney disease Father    Hypertension Brother    Epilepsy Brother    Heart disease Maternal Grandmother    Dementia Paternal Grandfather    Social History:  Social History   Substance and Sexual Activity  Alcohol Use Not Currently     Social History   Substance and Sexual Activity  Drug Use No    Social History   Socioeconomic History   Marital status: Single    Spouse  name: Not on file   Number of children: 1   Years of education: Not on file   Highest education level: High school graduate  Occupational History   Not on file  Tobacco Use   Smoking status: Never    Passive exposure: Never   Smokeless tobacco: Never  Vaping Use   Vaping status: Never  Used  Substance and Sexual Activity   Alcohol use: Not Currently   Drug use: No   Sexual activity: Yes    Birth control/protection: None  Other Topics Concern   Not on file  Social History Narrative   1 step daughter    Social Drivers of Corporate investment banker Strain: Not on file  Food Insecurity: No Food Insecurity (11/12/2023)   Hunger Vital Sign    Worried About Running Out of Food in the Last Year: Never true    Ran Out of Food in the Last Year: Never true  Transportation Needs: No Transportation Needs (11/12/2023)   PRAPARE - Administrator, Civil Service (Medical): No    Lack of Transportation (Non-Medical): No  Physical Activity: Not on file  Stress: Not on file  Social Connections: Not on file   Past Medical History:  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 No pertinent surgical history.  Current Medications: Current Facility-Administered Medications  Medication Dose Route Frequency Provider Last Rate Last Admin   acetaminophen  (TYLENOL ) tablet 650 mg  650 mg Oral Q6H PRN Smith, Annie B, NP       alum & mag hydroxide-simeth (MAALOX/MYLANTA) 200-200-20 MG/5ML suspension 30 mL  30 mL Oral Q4H PRN Smith, Annie B, NP   30 mL at 11/15/23 1034   ARIPiprazole  (ABILIFY ) tablet 10 mg  10 mg Oral Daily Smith, Annie B, NP   10 mg at 11/16/23 9157   haloperidol  (HALDOL ) tablet 5 mg  5 mg Oral TID PRN Smith, Annie B, NP       And   diphenhydrAMINE  (BENADRYL ) capsule 50 mg  50 mg Oral TID PRN Smith, Annie B, NP       haloperidol  lactate (HALDOL ) injection 5 mg  5 mg Intramuscular TID PRN Smith, Annie B, NP       And   diphenhydrAMINE  (BENADRYL ) injection 50 mg  50 mg Intramuscular TID PRN Smith, Annie B, NP       And   LORazepam  (ATIVAN ) injection 2 mg  2 mg  Intramuscular TID PRN Smith, Annie B, NP       haloperidol  lactate (HALDOL ) injection 10 mg  10 mg Intramuscular TID PRN Smith, Annie B, NP       And   diphenhydrAMINE  (BENADRYL ) injection 50 mg  50 mg Intramuscular TID PRN Smith, Annie B, NP       And   LORazepam  (ATIVAN ) injection 2 mg  2 mg Intramuscular TID PRN Smith, Annie B, NP       hydrOXYzine  (ATARAX ) tablet 25 mg  25 mg Oral TID PRN Smith, Annie B, NP   25 mg at 11/13/23 2353   magnesium  hydroxide (MILK OF MAGNESIA) suspension 30 mL  30 mL Oral Daily PRN Smith, Annie B, NP       traZODone  (DESYREL ) tablet 50 mg  50 mg Oral QHS PRN Smith, Annie B, NP   50  mg at 11/15/23 2116    Lab Results:  No results found for this or any previous visit (from the past 48 hours).   Blood Alcohol level:  Lab Results  Component Value Date   [facility] <15 11/12/2023   ETH <10 05/07/2023    Metabolic Disorder Labs: Lab Results  Component Value Date   HGBA1C 4.9 11/14/2023   MPG 93.93 11/14/2023   MPG 102.54 05/11/2023   Lab Results  Component Value Date   PROLACTIN 18.4 (H) 09/11/2015   Lab Results  Component Value Date   CHOL 171 11/14/2023   TRIG 73 11/14/2023   HDL 48 11/14/2023   CHOLHDL 3.6 11/14/2023   VLDL 15 11/14/2023   LDLCALC 108 (H) 11/14/2023   LDLCALC 93 05/17/2023    Physical Findings: AIMS:  , ,  ,  ,    CIWA:    COWS:      Psychiatric Specialty Exam:  Presentation  General Appearance:  Appropriate for Environment  Eye Contact: Fair  Speech: Other (comment) (Increased rate)  Speech Volume: Normal    Mood and Affect  Mood: Depressed; Anxious  Affect: Depressed   Thought Process  Thought Processes: Coherent  Descriptions of Associations:Intact  Orientation:Full (Time, Place and Person)  Thought Content:Paranoid Ideation  Hallucinations:No data recorded  Ideas of Reference:Paranoia  Suicidal Thoughts:No data recorded  Homicidal Thoughts:No data recorded   Sensorium   Memory: Immediate Fair; Recent Poor; Remote Fair  Judgment: Fair  Insight: Fair   Art therapist  Concentration: Poor  Attention Span: Poor  Recall: Poor  Fund of Knowledge: Fair  Language: Fair   Psychomotor Activity  Psychomotor Activity:No data recorded Musculoskeletal: Strength & Muscle Tone: within normal limits Gait & Station: normal Assets  Assets: Manufacturing systems engineer; Desire for Improvement; Social Support; Housing; Health and safety inspector    Physical Exam: Physical Exam HENT:     Head: Normocephalic.  Neurological:     Mental Status: He is alert and oriented to person, place, and time.    Review of Systems  Constitutional:  Negative for fever.  Respiratory:  Negative for sputum production.   Cardiovascular:  Negative for chest pain.  Gastrointestinal:  Negative for diarrhea, nausea and vomiting.  Neurological:  Negative for headaches.  Psychiatric/Behavioral:  Negative for depression, hallucinations, substance abuse and suicidal ideas. The patient is nervous/anxious. The patient does not have insomnia.    Blood pressure 125/87, pulse 95, temperature 98.4 F (36.9 C), resp. rate 18, height 5' 9 (1.753 m), weight 68 kg, SpO2 100%. Body mass index is 22.15 kg/m.  Diagnosis: Principal Problem:   Bipolar 1 disorder (HCC)   PLAN: Safety and Monitoring:  -- Voluntary admission to inpatient psychiatric unit for safety, stabilization and treatment  -- Daily contact with patient to assess and evaluate symptoms and progress in treatment  -- Patient's case to be discussed in multi-disciplinary team meeting  -- Observation Level : q15 minute checks  -- Vital signs:  q12 hours  -- Precautions: suicide, elopement, and assault -- Encouraged patient to participate in unit milieu and in scheduled group therapies  2. Psychiatric Diagnoses and Treatment:  Bipolar 1 disorder  -Contiue abilify  10mg  daily to target mood stabilization, racing  thoughts, anxiety, paranoia and reported auditory hallucinations   The risks/benefits/side-effects/alternatives to this medication were discussed in detail with the patient and time was given for questions. The patient consents to medication trial. -- Metabolic profile and EKG monitoring obtained while on an atypical antipsychotic  -- Encouraged patient to participate  in unit milieu and in scheduled group therapies  3. Medical Issues Being Addressed:  Elevated blood pressure reading- patient declined propranolol  being added to medication regimen at this time. Will continue to monitor   4. Discharge Planning:   -- Social work and case management to assist with discharge planning and identification of hospital follow-up needs prior to discharge  -- Estimated LOS: 3-4 days  Zelda Sharps, NP

## 2023-11-16 NOTE — Progress Notes (Signed)
   11/16/23 1000  Psych Admission Type (Psych Patients Only)  Admission Status Voluntary  Psychosocial Assessment  Patient Complaints None  Eye Contact Fair  Facial Expression Anxious  Affect Appropriate to circumstance  Speech Logical/coherent  Interaction Assertive  Motor Activity Slow  Appearance/Hygiene Unremarkable  Behavior Characteristics Cooperative  Mood Pleasant  Thought Process  Coherency WDL  Content WDL  Delusions None reported or observed  Perception WDL  Hallucination None reported or observed  Judgment Impaired  Confusion None  Danger to Self  Current suicidal ideation? Denies  Agreement Not to Harm Self Yes  Description of Agreement verbal  Danger to Others  Danger to Others None reported or observed

## 2023-11-16 NOTE — Group Note (Signed)
 Recreation Therapy Group Note   Group Topic:Health and Wellness  Group Date: 11/16/2023 Start Time: 1000 End Time: 1100 Facilitators: Celestia Jeoffrey BRAVO, LRT, CTRS Location: Courtyard  Group Description: [employer]. LRT and patients played games of basketball, drew with chalk, and played corn hole while outside in the courtyard while getting fresh air and sunlight. Music was being played in the background. LRT and peers conversed about different games they have played before, what they do in their free time and anything else that is on their minds. LRT encouraged pts to drink water after being outside, sweating and getting their heart rate up.  Goal Area(s) Addressed: Patient will build on frustration tolerance skills. Patients will partake in a competitive play game with peers. Patients will gain knowledge of new leisure interest/hobby.    Affect/Mood: Appropriate   Participation Level: Active   Participation Quality: Independent   Behavior: Appropriate   Speech/Thought Process: Coherent   Insight: Good   Judgement: Good   Modes of Intervention: Activity   Patient Response to Interventions:  Receptive   Education Outcome:  Acknowledges education   Clinical Observations/Individualized Feedback: Louis Newton was active in their participation of session activities and group discussion. Pt interacted well with LRT and peers duration of session.    Plan: Continue to engage patient in RT group sessions 2-3x/week.   Jeoffrey BRAVO Celestia, LRT, CTRS 11/16/2023 11:20 AM

## 2023-11-16 NOTE — Group Note (Signed)
 [facility] LCSW Group Therapy Note   Group Date: 11/16/2023 Start Time: 1300 End Time: 1400  Type of Therapy/Topic:  Group Therapy:  Feelings about Diagnosis  Participation Level:  Minimal    Description of Group:    This group will allow patients to explore their thoughts and feelings about diagnoses they have received. Patients will be guided to explore their level of understanding and acceptance of these diagnoses. Facilitator will encourage patients to process their thoughts and feelings about the reactions of others to their diagnosis, and will guide patients in identifying ways to discuss their diagnosis with significant others in their lives. This group will be process-oriented, with patients participating in exploration of their own experiences as well as giving and receiving support and challenge from other group members.   Therapeutic Goals: 1. Patient will demonstrate understanding of diagnosis as evidence by identifying two or more symptoms of the disorder:  2. Patient will be able to express two feelings regarding the diagnosis 3. Patient will demonstrate ability to communicate their needs through discussion and/or role plays  Summary of Patient Progress: Patient was present for the entirety of group. He did not speak much during the group but when he did his comments were on topic. Insight into the topic and himself remains questionable. Pt appeared open and receptive to feedback/comments from both his peers and the facilitator.   Therapeutic Modalities:   Cognitive Behavioral Therapy Brief Therapy Feelings Identification    Nadara JONELLE Fam, LCSW

## 2023-11-16 NOTE — Group Note (Signed)
 Date:  11/16/2023 Time:  8:58 PM  Group Topic/Focus:  Building Self Esteem:   The Focus of this group is helping patients become aware of the effects of self-esteem on their lives, the things they and others do that enhance or undermine their self-esteem, seeing the relationship between their level of self-esteem and the choices they make and learning ways to enhance self-esteem.    Participation Level:  Active  Participation Quality:  Appropriate, Attentive, Sharing, and Supportive  Affect:  Appropriate  Cognitive:  Appropriate  Insight: Appropriate and Good  Engagement in Group:  Engaged and Supportive  Modes of Intervention:  Discussion and Role-play  Additional Comments:     Kerri Katz 11/16/2023, 8:58 PM

## 2023-11-16 NOTE — Group Note (Signed)
 Date:  11/16/2023 Time:  2:19 PM  Group Topic/Focus:  Rediscovering Joy:   The focus of this group is to explore various ways to relieve stress in a positive manner.    Participation Level:  Active  Participation Quality:  Appropriate  Affect:  Appropriate  Cognitive:  Alert  Insight: Appropriate  Engagement in Group:  Engaged  Modes of Intervention:  Activity, Discussion, and Education  Additional Comments:    Louis Newton 11/16/2023, 2:19 PM

## 2023-11-16 NOTE — Plan of Care (Signed)
   Problem: Education: Goal: Mental status will improve Outcome: Progressing Goal: Verbalization of understanding the information provided will improve Outcome: Progressing   Problem: Activity: Goal: Interest or engagement in activities will improve Outcome: Progressing Goal: Sleeping patterns will improve Outcome: Progressing   Problem: Coping: Goal: Ability to verbalize frustrations and anger appropriately will improve Outcome: Progressing Goal: Ability to demonstrate self-control will improve Outcome: Progressing

## 2023-11-16 NOTE — Plan of Care (Signed)
   Problem: Education: Goal: Emotional status will improve Outcome: Progressing Goal: Mental status will improve Outcome: Progressing

## 2023-11-17 DIAGNOSIS — F319 Bipolar disorder, unspecified: Secondary | ICD-10-CM | POA: Diagnosis not present

## 2023-11-17 NOTE — Plan of Care (Signed)
  Problem: Education: Goal: Emotional status will improve Outcome: Progressing   Problem: Education: Goal: Mental status will improve Outcome: Progressing   Problem: Activity: Goal: Interest or engagement in activities will improve Outcome: Progressing Goal: Sleeping patterns will improve Outcome: Progressing   Problem: Safety: Goal: Periods of time without injury will increase Outcome: Progressing

## 2023-11-17 NOTE — Group Note (Signed)
 Date:  11/17/2023 Time:  4:59 PM  Group Topic/Focus:  Personal Choices and Values:   The focus of this group is to help patients assess and explore the importance of values in their lives, how their values affect their decisions, how they express their values and what opposes their expression.    Participation Level:  Active  Participation Quality:  Appropriate  Affect:  Appropriate  Cognitive:  Appropriate  Insight: Appropriate  Engagement in Group:  Engaged  Modes of Intervention:  Activity  Additional Comments:    Louis Newton 11/17/2023, 4:59 PM

## 2023-11-17 NOTE — Group Note (Signed)
 Date:  11/17/2023 Time:  5:05 PM  Group Topic/Focus:  Activity Group: The focus of the group is to promote activity and encourage patients to go outside to the courtyard to get some fresh air and some exercise.    Participation Level:  Active  Participation Quality:  Appropriate  Affect:  Appropriate  Cognitive:  Appropriate  Insight: Appropriate  Engagement in Group:  Engaged  Modes of Intervention:  Activity  Additional Comments:    Louis Newton [name] [name] 11/17/2023, 5:05 PM

## 2023-11-17 NOTE — Progress Notes (Signed)
 [facility] MD Progress Note  11/17/2023 9:12 AM Louis Newton  MRN:  [id number]   Subjective:  Chart reviewed, case discussed in multidisciplinary meeting, patient seen during rounds.   Per H&P: Patient is 38 year old male admitted to the inpatient unit for stabilization and monitoring. Patient appears anxious on exam. Interacting well with peers and staff on the unit. Patient carries the previous diagnosis of bipolar 1 disorder Patient appeared anxious and restless during interview. Patient with pressured speech, but not elevated tone of voice. Patient was cooperative with assessment.   Patient admitted to inpatient unit with diagnosis of bipolar 1 disorder. Patient was started on abilify  5 mg daily, trazodone  50mg  PRN at night for sleep and hydroxyzine  25mg  three time daily PRN for anxiety.   9/24: On follow-up patient is alert and oriented.  They are pleasant and cooperative on exam.  They deny adverse effects of medications.  They deny SI, HI, and AVH.  They voiced no concerns or complaints at this time.  They demonstrate good insight  into the need for outpatient follow-up and medication compliance. No behavioral PRNs overnight. Rates anxiety and depression 0/10. He is linear, logical, and future oriented on exam. Is able to discuss his social support, crisis resources, and coping skills. Feels mood and appetite are stable. States sleep is increased from baseline.    9/23: On follow-up patient is alert and oriented.  They are pleasant and cooperative on exam.  They deny adverse effects of medications.  They deny SI, HI, and AVH.  They voiced no concerns or complaints at this time.  They demonstrate good insight into the need for outpatient follow-up and medication compliance. No Behavioral PRNS over night.Denying paranoia today. Continue BP monitoring.   9/22: Patient seen for follow up states he got sick watching stuff on line and started having SI and could not relax. Notes he has decreased  appetite. Had been off of medications for awhile because he had started to feel better. Notes that he felt groggy on meds so discontinued in April. Feels supported at home. Lives with mom and is employed. Reports slep is fair today. Appetite is stable. Is agreeable to outpatient follow up. No Behavioral PRNS over night. Denies SI/HI/AVH. Orientation was intact. Feels the paranoia is improving is uncertain what he was paranoid about. States sometimes his anxiety just makes him paranoid. Again discussed BP and anxiety, he declined further medication management. Denies adverse effects of medications, voices no concerns or complaints.   9/21 Today on exam, patient still appeared anxious and restless, but reported some improvement in symptoms. We dicussed increasing abilify  to 10mg  for further symptom control to which patient was agreeable. He denied any noted side effects of the new medication. Patient was noted to have elevated blood pressure readings during his admission, we discussed potentially adding propranolol  for bp and anxiety control, but patient declined and reported hydroxyzine  worked well for him last night and he wished to keep that the same at this time. Patient denied suicidal or homicidal thoughts. He denied visual hallucinations. When asked about auditory hallucinations that were reported last night, patient stated I think that was just the door slamming, actually. He denied hearing any voices. He did report feelings of paranoia, racing thoughts, feeling on edge, and being hypervigilant. He reported he is trying to interact more with peers on the unit to distract himself. He denied any physical complaints today.    Sleep: patient reported sleeping a little more last night  Appetite:  Fair  Past Psychiatric History: see h&P Family History:  Family History  Problem Relation Age of Onset   Kidney disease Father    Hypertension Brother    Epilepsy Brother    Heart disease Maternal  Grandmother    Dementia Paternal Grandfather    Social History:  Social History   Substance and Sexual Activity  Alcohol Use Not Currently     Social History   Substance and Sexual Activity  Drug Use No    Social History   Socioeconomic History   Marital status: Single    Spouse name: Not on file   Number of children: 1   Years of education: Not on file   Highest education level: High school graduate  Occupational History   Not on file  Tobacco Use   Smoking status: Never    Passive exposure: Never   Smokeless tobacco: Never  Vaping Use   Vaping status: Never Used  Substance and Sexual Activity   Alcohol use: Not Currently   Drug use: No   Sexual activity: Yes    Birth control/protection: None  Other Topics Concern   Not on file  Social History Narrative   1 step daughter    Social Drivers of Corporate investment banker Strain: Not on file  Food Insecurity: No Food Insecurity (11/12/2023)   Hunger Vital Sign    Worried About Running Out of Food in the Last Year: Never true    Ran Out of Food in the Last Year: Never true  Transportation Needs: No Transportation Needs (11/12/2023)   PRAPARE - Administrator, Civil Service (Medical): No    Lack of Transportation (Non-Medical): No  Physical Activity: Not on file  Stress: Not on file  Social Connections: Not on file   Past Medical History:  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 No pertinent surgical history.  Current Medications: Current Facility-Administered Medications  Medication Dose Route Frequency Provider Last Rate Last Admin   acetaminophen  (TYLENOL ) tablet 650 mg  650 mg Oral Q6H PRN Smith, Annie B, NP       alum & mag hydroxide-simeth (MAALOX/MYLANTA) 200-200-20 MG/5ML suspension 30 mL   30 mL Oral Q4H PRN Smith, Annie B, NP   30 mL at 11/15/23 1034   ARIPiprazole  (ABILIFY ) tablet 10 mg  10 mg Oral Daily Smith, Annie B, NP   10 mg at 11/17/23 9155   haloperidol  (HALDOL ) tablet 5 mg  5 mg Oral TID PRN Smith, Annie B, NP       And   diphenhydrAMINE  (BENADRYL ) capsule 50 mg  50 mg Oral TID PRN Smith, Annie B, NP       haloperidol  lactate (HALDOL ) injection 5 mg  5 mg Intramuscular TID PRN Smith, Annie B, NP       And   diphenhydrAMINE  (BENADRYL ) injection 50 mg  50 mg Intramuscular TID PRN Smith, Annie B, NP       And   LORazepam  (ATIVAN ) injection 2 mg  2 mg Intramuscular TID PRN Smith, Annie B, NP       haloperidol  lactate (HALDOL ) injection 10 mg  10 mg Intramuscular TID PRN Smith, Annie B, NP       And   diphenhydrAMINE  (BENADRYL ) injection 50 mg  50 mg Intramuscular TID PRN Claudene,  Annie B, NP       And   LORazepam  (ATIVAN ) injection 2 mg  2 mg Intramuscular TID PRN Smith, Annie B, NP       hydrOXYzine  (ATARAX ) tablet 25 mg  25 mg Oral TID PRN Smith, Annie B, NP   25 mg at 11/13/23 2353   magnesium  hydroxide (MILK OF MAGNESIA) suspension 30 mL  30 mL Oral Daily PRN Smith, Annie B, NP       traZODone  (DESYREL ) tablet 50 mg  50 mg Oral QHS PRN Smith, Annie B, NP   50 mg at 11/16/23 2109    Lab Results:  No results found for this or any previous visit (from the past 48 hours).   Blood Alcohol level:  Lab Results  Component Value Date   [facility] <15 11/12/2023   ETH <10 05/07/2023    Metabolic Disorder Labs: Lab Results  Component Value Date   HGBA1C 4.9 11/14/2023   MPG 93.93 11/14/2023   MPG 102.54 05/11/2023   Lab Results  Component Value Date   PROLACTIN 18.4 (H) 09/11/2015   Lab Results  Component Value Date   CHOL 171 11/14/2023   TRIG 73 11/14/2023   HDL 48 11/14/2023   CHOLHDL 3.6 11/14/2023   VLDL 15 11/14/2023   LDLCALC 108 (H) 11/14/2023   LDLCALC 93 05/17/2023    Physical Findings: AIMS:  , ,  ,  ,    CIWA:    COWS:      Psychiatric  Specialty Exam:  Presentation  General Appearance:  Appropriate for Environment  Eye Contact: Fair  Speech: Other (comment) (Increased rate)  Speech Volume: Normal    Mood and Affect  Mood: Depressed; Anxious  Affect: Depressed   Thought Process  Thought Processes: Coherent  Descriptions of Associations:Intact  Orientation:Full (Time, Place and Person)  Thought Content:Paranoid Ideation  Hallucinations:No data recorded  Ideas of Reference:Paranoia  Suicidal Thoughts:No data recorded  Homicidal Thoughts:No data recorded   Sensorium  Memory: Immediate Fair; Recent Poor; Remote Fair  Judgment: Fair  Insight: Fair   Art therapist  Concentration: Poor  Attention Span: Poor  Recall: Poor  Fund of Knowledge: Fair  Language: Fair   Psychomotor Activity  Psychomotor Activity:No data recorded Musculoskeletal: Strength & Muscle Tone: within normal limits Gait & Station: normal Assets  Assets: Manufacturing systems engineer; Desire for Improvement; Social Support; Housing; Health and safety inspector    Physical Exam: Physical Exam HENT:     Head: Normocephalic.  Neurological:     Mental Status: He is alert and oriented to person, place, and time.  Psychiatric:        Mood and Affect: Mood normal.        Behavior: Behavior normal.    Review of Systems  Constitutional:  Negative for fever.  Respiratory:  Negative for sputum production.   Cardiovascular:  Negative for chest pain.  Gastrointestinal:  Negative for diarrhea, nausea and vomiting.  Neurological:  Negative for headaches.  Psychiatric/Behavioral:  Negative for depression, hallucinations, substance abuse and suicidal ideas. The patient is not nervous/anxious and does not have insomnia.    Blood pressure 122/80, pulse 91, temperature (!) 97.3 F (36.3 C), resp. rate 18, height 5' 9 (1.753 m), weight 68 kg, SpO2 99%. Body mass index is 22.15 kg/m.  Diagnosis: Principal  Problem:   Bipolar 1 disorder (HCC)   PLAN: Safety and Monitoring:  -- Voluntary admission to inpatient psychiatric unit for safety, stabilization and treatment  -- Daily contact with patient to assess  and evaluate symptoms and progress in treatment  -- Patient's case to be discussed in multi-disciplinary team meeting  -- Observation Level : q15 minute checks  -- Vital signs:  q12 hours  -- Precautions: suicide, elopement, and assault -- Encouraged patient to participate in unit milieu and in scheduled group therapies  2. Psychiatric Diagnoses and Treatment:  Pt is doing well, no concerns we are in discharge phase plan for discharge in the comings.   Bipolar 1 disorder  -Contiue abilify  10mg  daily to target mood stabilization, racing thoughts, anxiety, paranoia and reported auditory hallucinations   The risks/benefits/side-effects/alternatives to this medication were discussed in detail with the patient and time was given for questions. The patient consents to medication trial. -- Metabolic profile and EKG monitoring obtained while on an atypical antipsychotic  -- Encouraged patient to participate in unit milieu and in scheduled group therapies  3. Medical Issues Being Addressed:  Elevated blood pressure reading- patient declined propranolol  being added to medication regimen at this time. Will continue to monitor   4. Discharge Planning:  -- d/c thurs/frid  -- Social work and case management to assist with discharge planning and identification of hospital follow-up needs prior to discharge  -- Estimated LOS: 3-4 days

## 2023-11-17 NOTE — Group Note (Signed)
 Date:  11/17/2023 Time:  8:41 PM  Group Topic/Focus:  Wrap-Up Group:   The focus of this group is to help patients review their daily goal of treatment and discuss progress on daily workbooks.    Participation Level:  Active  Participation Quality:  Appropriate and Attentive  Affect:  Appropriate  Cognitive:  Appropriate  Insight: Appropriate and Good  Engagement in Group:  Engaged  Modes of Intervention:  Orientation  Additional Comments:    Bonnielee LOISE Pepper 11/17/2023, 8:41 PM

## 2023-11-17 NOTE — Progress Notes (Signed)
   11/17/23 1500  Psych Admission Type (Psych Patients Only)  Admission Status Voluntary  Psychosocial Assessment  Patient Complaints None  Eye Contact Fair  Facial Expression Anxious  Affect Sad  Speech Logical/coherent  Interaction Assertive  Motor Activity Slow  Appearance/Hygiene Unremarkable  Behavior Characteristics Cooperative;Appropriate to situation  Mood Pleasant  Thought Process  Coherency WDL  Content WDL  Delusions None reported or observed  Perception WDL  Hallucination None reported or observed  Judgment Impaired  Confusion None  Danger to Self  Current suicidal ideation? Denies  Agreement Not to Harm Self Yes  Description of Agreement verbal

## 2023-11-17 NOTE — [facility] Suicide Risk Assessment (Signed)
 [facility] INPATIENT:  Family/Significant Other Suicide Prevention Education  Suicide Prevention Education:  Contact Attempts: Ella/cousin [phone]), has been identified by the patient as the family member/significant other with whom the patient will be residing, and identified as the person(s) who will aid the patient in the event of a mental health crisis.  With written consent from the patient, two attempts were made to provide suicide prevention education, prior to and/or following the patient's discharge.  We were unsuccessful in providing suicide prevention education.  A suicide education pamphlet was given to the patient to share with family/significant other.   Date and time of first attempt: 11/15/23 at 3:16 PM Date and time of second attempt: 11/17/23 t 1:58 PM   Contact was unable to be established and there was no voicemail box set up to leave message.  Cristen Bredeson, MSW, LCSWA 11/17/2023 1:58 PM

## 2023-11-17 NOTE — Plan of Care (Signed)
   Problem: Education: Goal: Emotional status will improve Outcome: Progressing Goal: Mental status will improve Outcome: Progressing

## 2023-11-17 NOTE — Group Note (Signed)
 LCSW Group Therapy Note  Group Date: 11/17/2023 Start Time: 1248 End Time: 1347   Type of Therapy and Topic:  Group Therapy: Positive Affirmations  Participation Level:  Active   Description of Group:   This group addressed positive affirmation towards self and others.  Patients went around the room and identified two positive things about themselves and two positive things about a peer in the room.  Patients reflected on how it felt to share something positive with others, to identify positive things about themselves, and to hear positive things from others/ Patients were encouraged to have a daily reflection of positive characteristics or circumstances.   Therapeutic Goals: Patients will verbalize two of their positive qualities Patients will demonstrate empathy for others by stating two positive qualities about a peer in the group Patients will verbalize their feelings when voicing positive self affirmations and when voicing positive affirmations of others Patients will discuss the potential positive impact on their wellness/recovery of focusing on positive traits of self and others.  Summary of Patient Progress:  Patient actively engaged in the discussion and was able to identify positive affirmations about themself as well as other group members. Patient demonstrated proficient insight into the subject matter, was respectful of peers, participated throughout the entire session.  Therapeutic Modalities:   Cognitive Behavioral Therapy Motivational Interviewing    Alveta CHRISTELLA Kerns, ISRAEL 11/17/2023  1:51 PM

## 2023-11-18 DIAGNOSIS — F319 Bipolar disorder, unspecified: Secondary | ICD-10-CM | POA: Diagnosis not present

## 2023-11-18 MED ORDER — ARIPIPRAZOLE 10 MG PO TABS: 10.0000 mg | ORAL_TABLET | Freq: Every day | ORAL | 0 refills | Status: AC

## 2023-11-18 NOTE — Group Note (Signed)
 LCSW Group Therapy Note  Group Date: 11/18/2023 Start Time: 1315 End Time: 1400   Type of Therapy and Topic:  Group Therapy: Anger Cues and Responses  Participation Level:  Minimal   Description of Group:   In this group, patients learned how to recognize the physical, cognitive, emotional, and behavioral responses they have to anger-provoking situations.  They identified a recent time they became angry and how they reacted.  They analyzed how their reaction was possibly beneficial and how it was possibly unhelpful.  The group discussed a variety of healthier coping skills that could help with such a situation in the future.  Focus was placed on how helpful it is to recognize the underlying emotions to our anger, because working on those can lead to a more permanent solution as well as our ability to focus on the important rather than the urgent.  Therapeutic Goals: Patients will remember their last incident of anger and how they felt emotionally and physically, what their thoughts were at the time, and how they behaved. Patients will identify how their behavior at that time worked for them, as well as how it worked against them. Patients will explore possible new behaviors to use in future anger situations. Patients will learn that anger itself is normal and cannot be eliminated, and that healthier reactions can assist with resolving conflict rather than worsening situations.  Summary of Patient Progress:   Patient was active during the group. He shared with the group his insight into anger and aggression and sometimes you have to pick and choose your battles He demonstrated fair insight into the subject matter, was respectful of peers, and participated throughout the entire session.  Therapeutic Modalities:   Cognitive Behavioral Therapy    Sherryle JINNY Margo, LCSW 11/18/2023  2:53 PM

## 2023-11-18 NOTE — Group Note (Signed)
 Recreation Therapy Group Note   Group Topic:General Recreation  Group Date: 11/18/2023 Start Time: 1000 End Time: 1100 Facilitators: Celestia Jeoffrey BRAVO, LRT, CTRS Location: Courtyard  Group Description: [employer]. LRT and patients played games of basketball, drew with chalk, and played corn hole while outside in the courtyard while getting fresh air and sunlight. Music was being played in the background. LRT and peers conversed about different games they have played before, what they do in their free time and anything else that is on their minds. LRT encouraged pts to drink water after being outside, sweating and getting their heart rate up.  Goal Area(s) Addressed: Patient will build on frustration tolerance skills. Patients will partake in a competitive play game with peers. Patients will gain knowledge of new leisure interest/hobby.     Affect/Mood: Flat   Participation Level: Minimal    Clinical Observations/Individualized Feedback: Louis Newton was present in group. Pt chose to sit alone away from peers, despite encouragement.   Plan: Continue to engage patient in RT group sessions 2-3x/week.   Jeoffrey BRAVO Celestia, LRT, CTRS 11/18/2023 11:35 AM

## 2023-11-18 NOTE — Group Note (Signed)
 Recreation Therapy Group Note   Group Topic:Relaxation  Group Date: 11/18/2023 Start Time: 1530 End Time: 1610 Facilitators: Celestia Jeoffrey BRAVO, LRT, CTRS Location: Dayroom  Group Description: PMR (Progressive Muscle Relaxation). LRT educates patients on what PMR is and the benefits that come from it. Patients are asked to sit with their feet flat on the floor while sitting up and all the way back in their chair, if possible. LRT and pts follow a prompt through a speaker that requires you to tense and release different muscles in their body and focus on their breathing. During session, lights are off and soft music is being played. Pts are given a stress ball to use if needed.   Goal Area(s) Addressed:  Patients will be able to describe progressive muscle relaxation.  Patient will practice using relaxation technique. Patient will identify a new coping skill.  Patient will follow multistep directions to reduce anxiety and stress.   Affect/Mood: Appropriate   Participation Level: Active and Engaged   Participation Quality: Independent   Behavior: Appropriate, Calm, and Cooperative   Speech/Thought Process: Coherent   Insight: Good   Judgement: Good   Modes of Intervention: Education and Exploration   Patient Response to Interventions:  Attentive, Engaged, Interested , and Receptive   Education Outcome:  Acknowledges education   Clinical Observations/Individualized Feedback: Louis Newton was active in their participation of session activities and group discussion. Pt completed all exercises as prompted.    Plan: Continue to engage patient in RT group sessions 2-3x/week.   [address], LRT, CTRS 11/18/2023 5:20 PM

## 2023-11-18 NOTE — [facility] Group Notes (Signed)
 Spirituality Group   Group Goal: Support / Education around grief and loss    Group Description: Following introductions and group rules, group members engaged in facilitated group dialog and support around topic of loss, with particular support around experiences of loss in their lives. Group members identified types of loss (relationships / self / things) as well as patterns, circumstances, and changes that precipitate loss. Reflection invited on thoughts / feelings around loss, normalized grief responses, and recognized variety in grief experience. Group noted Worden's four tasks of grief in discussion. Group drew on Adlerian / Rogerian, narrative, MI, with Yalom's group therapy as a primary framework.   Observations: Louis Newton was an active participant in the group discussion. He shared meaningful memories of his grandfather. He offered peer support and helped foster trust and mutual empathy within the group.  [name] L. [name].Div

## 2023-11-18 NOTE — Plan of Care (Signed)
   Problem: Coping: Goal: Ability to verbalize frustrations and anger appropriately will improve Outcome: Progressing

## 2023-11-18 NOTE — Plan of Care (Signed)

## 2023-11-18 NOTE — Progress Notes (Signed)
 Pt calm and pleasant during assessment denying SI/HI/AVH. Pt observed interacting appropriately with staff and peers on the unit. Pt compliant with medication administration per MD orders. Pt given education, support, and encouragement to be active in his treatment plan. Pt being monitored Q 15 minutes for safety per unit protocol, remains safe on the unit

## 2023-11-18 NOTE — Progress Notes (Signed)
 Pt's pleasant, engaging and participating.  Reports feeling good addingI had not slept for 6 days when I came into the hospital, but since Sunday, he has been sleeping very well. He denied SI/HI, depression, anxiety, and AVH.  Also, that he was not taking prescribed medication and acknowledge that he decompensated.  Pt verbalized understanding that he has to continue taking his medication and see his out patient provided regularly   11/17/23 2300  Psych Admission Type (Psych Patients Only)  Admission Status Voluntary  Psychosocial Assessment  Patient Complaints None  Eye Contact Avertive  Facial Expression Animated  Affect Appropriate to circumstance  Speech Logical/coherent  Interaction Assertive  Motor Activity Slow  Appearance/Hygiene Unremarkable  Behavior Characteristics Appropriate to situation;Cooperative  Mood Pleasant  Thought Process  Coherency WDL  Content WDL  Delusions None reported or observed  Perception WDL  Hallucination None reported or observed  Judgment Limited  Confusion None  Danger to Self  Current suicidal ideation? Denies  Agreement Not to Harm Self Yes  Description of Agreement Verbal  Danger to Others  Danger to Others None reported or observed

## 2023-11-18 NOTE — Progress Notes (Signed)
 [facility] MD Progress Note  11/18/2023 9:45 AM Louis Newton  MRN:  [id number]   Subjective:  Chart reviewed, case discussed in multidisciplinary meeting, patient seen during rounds.   9/25: On follow-up patient is alert and oriented.  They are pleasant and cooperative on exam.  They deny adverse effects of medications.  They deny SI, HI, and AVH.  They voiced no concerns or complaints at this time.  They demonstrate good insight into the need for outpatient follow-up and medication compliance. They ask if their follow up can be switched to virtual. They are able to discuss their support system specifically citing their family who they live with. They are able to discuss crisis resources specifically calling 911, and all also notified about 988 and that they can go to any emergency room. They are able to discuss their coping mechanisms with a primary focus on exercise. They report stable mood, appetite, and sleep. They were linear and logical on exam. Denied symptoms of mania and psychosis.    9/24: On follow-up patient is alert and oriented.  They are pleasant and cooperative on exam.  They deny adverse effects of medications.  They deny SI, HI, and AVH.  They voiced no concerns or complaints at this time.  They demonstrate good insight  into the need for outpatient follow-up and medication compliance. No behavioral PRNs overnight. Rates anxiety and depression 0/10. He is linear, logical, and future oriented on exam. Is able to discuss his social support, crisis resources, and coping skills. Feels mood and appetite are stable. States sleep is increased from baseline.    9/23: On follow-up patient is alert and oriented.  They are pleasant and cooperative on exam.  They deny adverse effects of medications.  They deny SI, HI, and AVH.  They voiced no concerns or complaints at this time.  They demonstrate good insight into the need for outpatient follow-up and medication compliance. No Behavioral PRNS over  night.Denying paranoia today. Continue BP monitoring.   9/22: Patient seen for follow up states he got sick watching stuff on line and started having SI and could not relax. Notes he has decreased appetite. Had been off of medications for awhile because he had started to feel better. Notes that he felt groggy on meds so discontinued in April. Feels supported at home. Lives with mom and is employed. Reports slep is fair today. Appetite is stable. Is agreeable to outpatient follow up. No Behavioral PRNS over night. Denies SI/HI/AVH. Orientation was intact. Feels the paranoia is improving is uncertain what he was paranoid about. States sometimes his anxiety just makes him paranoid. Again discussed BP and anxiety, he declined further medication management. Denies adverse effects of medications, voices no concerns or complaints.   9/21 Today on exam, patient still appeared anxious and restless, but reported some improvement in symptoms. We dicussed increasing abilify  to 10mg  for further symptom control to which patient was agreeable. He denied any noted side effects of the new medication. Patient was noted to have elevated blood pressure readings during his admission, we discussed potentially adding propranolol  for bp and anxiety control, but patient declined and reported hydroxyzine  worked well for him last night and he wished to keep that the same at this time. Patient denied suicidal or homicidal thoughts. He denied visual hallucinations. When asked about auditory hallucinations that were reported last night, patient stated I think that was just the door slamming, actually. He denied hearing any voices. He did report feelings of paranoia, racing thoughts,  feeling on edge, and being hypervigilant. He reported he is trying to interact more with peers on the unit to distract himself. He denied any physical complaints today.    Sleep: patient reported sleeping a little more last night  Appetite:   Fair  Past Psychiatric History: see h&P Family History:  Family History  Problem Relation Age of Onset   Kidney disease Father    Hypertension Brother    Epilepsy Brother    Heart disease Maternal Grandmother    Dementia Paternal Grandfather    Social History:  Social History   Substance and Sexual Activity  Alcohol Use Not Currently     Social History   Substance and Sexual Activity  Drug Use No    Social History   Socioeconomic History   Marital status: Single    Spouse name: Not on file   Number of children: 1   Years of education: Not on file   Highest education level: High school graduate  Occupational History   Not on file  Tobacco Use   Smoking status: Never    Passive exposure: Never   Smokeless tobacco: Never  Vaping Use   Vaping status: Never Used  Substance and Sexual Activity   Alcohol use: Not Currently   Drug use: No   Sexual activity: Yes    Birth control/protection: None  Other Topics Concern   Not on file  Social History Narrative   1 step daughter    Social Drivers of Corporate investment banker Strain: Not on file  Food Insecurity: No Food Insecurity (11/12/2023)   Hunger Vital Sign    Worried About Running Out of Food in the Last Year: Never true    Ran Out of Food in the Last Year: Never true  Transportation Needs: No Transportation Needs (11/12/2023)   PRAPARE - Administrator, Civil Service (Medical): No    Lack of Transportation (Non-Medical): No  Physical Activity: Not on file  Stress: Not on file  Social Connections: Not on file   Past Medical History:  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 No pertinent surgical history.  Current Medications: Current Facility-Administered Medications  Medication Dose Route  Frequency Provider Last Rate Last Admin   acetaminophen  (TYLENOL ) tablet 650 mg  650 mg Oral Q6H PRN Smith, Annie B, NP       alum & mag hydroxide-simeth (MAALOX/MYLANTA) 200-200-20 MG/5ML suspension 30 mL  30 mL Oral Q4H PRN Smith, Annie B, NP   30 mL at 11/15/23 1034   ARIPiprazole  (ABILIFY ) tablet 10 mg  10 mg Oral Daily Smith, Annie B, NP   10 mg at 11/18/23 0848   haloperidol  (HALDOL ) tablet 5 mg  5 mg Oral TID PRN Smith, Annie B, NP       And   diphenhydrAMINE  (BENADRYL ) capsule 50 mg  50 mg Oral TID PRN Smith, Annie B, NP       haloperidol  lactate (HALDOL ) injection 5 mg  5 mg Intramuscular TID PRN Smith, Annie B, NP       And   diphenhydrAMINE  (BENADRYL ) injection 50 mg  50 mg Intramuscular TID PRN Smith, Annie B, NP       And   LORazepam  (ATIVAN ) injection 2 mg  2 mg Intramuscular TID PRN Smith, Annie B, NP  haloperidol  lactate (HALDOL ) injection 10 mg  10 mg Intramuscular TID PRN Smith, Annie B, NP       And   diphenhydrAMINE  (BENADRYL ) injection 50 mg  50 mg Intramuscular TID PRN Smith, Annie B, NP       And   LORazepam  (ATIVAN ) injection 2 mg  2 mg Intramuscular TID PRN Smith, Annie B, NP       hydrOXYzine  (ATARAX ) tablet 25 mg  25 mg Oral TID PRN Smith, Annie B, NP   25 mg at 11/13/23 2353   magnesium  hydroxide (MILK OF MAGNESIA) suspension 30 mL  30 mL Oral Daily PRN Smith, Annie B, NP       traZODone  (DESYREL ) tablet 50 mg  50 mg Oral QHS PRN Smith, Annie B, NP   50 mg at 11/16/23 2109    Lab Results:  No results found for this or any previous visit (from the past 48 hours).   Blood Alcohol level:  Lab Results  Component Value Date   [facility] <15 11/12/2023   ETH <10 05/07/2023    Metabolic Disorder Labs: Lab Results  Component Value Date   HGBA1C 4.9 11/14/2023   MPG 93.93 11/14/2023   MPG 102.54 05/11/2023   Lab Results  Component Value Date   PROLACTIN 18.4 (H) 09/11/2015   Lab Results  Component Value Date   CHOL 171 11/14/2023   TRIG 73 11/14/2023    HDL 48 11/14/2023   CHOLHDL 3.6 11/14/2023   VLDL 15 11/14/2023   LDLCALC 108 (H) 11/14/2023   LDLCALC 93 05/17/2023    Physical Findings: AIMS:  , ,  ,  ,    CIWA:    COWS:      Psychiatric Specialty Exam:  Presentation  General Appearance:  Appropriate for Environment  Eye Contact: Fair  Speech: Other (comment) (Increased rate)  Speech Volume: Normal    Mood and Affect  Mood: Depressed; Anxious  Affect: Depressed   Thought Process  Thought Processes: Coherent  Descriptions of Associations:Intact  Orientation:Full (Time, Place and Person)  Thought Content:Paranoid Ideation  Hallucinations:No data recorded  Ideas of Reference:Paranoia  Suicidal Thoughts:No data recorded  Homicidal Thoughts:No data recorded   Sensorium  Memory: Immediate Fair; Recent Poor; Remote Fair  Judgment: Fair  Insight: Fair   Art therapist  Concentration: Poor  Attention Span: Poor  Recall: Poor  Fund of Knowledge: Fair  Language: Fair   Psychomotor Activity  Psychomotor Activity:No data recorded Musculoskeletal: Strength & Muscle Tone: within normal limits Gait & Station: normal Assets  Assets: Manufacturing systems engineer; Desire for Improvement; Social Support; Housing; Health and safety inspector    Physical Exam: Physical Exam HENT:     Head: Normocephalic.  Neurological:     Mental Status: He is alert and oriented to person, place, and time.  Psychiatric:        Mood and Affect: Mood normal.        Behavior: Behavior normal.        Thought Content: Thought content normal.        Judgment: Judgment normal.    Review of Systems  Constitutional:  Negative for fever.  Respiratory:  Negative for sputum production.   Cardiovascular:  Negative for chest pain.  Gastrointestinal:  Negative for diarrhea, nausea and vomiting.  Neurological:  Negative for headaches.  Psychiatric/Behavioral:  Negative for depression, hallucinations,  substance abuse and suicidal ideas. The patient is not nervous/anxious and does not have insomnia.    Blood pressure (!) 132/93, pulse 78, temperature 98.2  F (36.8 C), resp. rate 18, height 5' 9 (1.753 m), weight 68 kg, SpO2 100%. Body mass index is 22.15 kg/m.  Diagnosis: Principal Problem:   Bipolar 1 disorder (HCC)   PLAN: Safety and Monitoring:  -- Voluntary admission to inpatient psychiatric unit for safety, stabilization and treatment  -- Daily contact with patient to assess and evaluate symptoms and progress in treatment  -- Patient's case to be discussed in multi-disciplinary team meeting  -- Observation Level : q15 minute checks  -- Vital signs:  q12 hours  -- Precautions: suicide, elopement, and assault -- Encouraged patient to participate in unit milieu and in scheduled group therapies  2. Psychiatric Diagnoses and Treatment:  Pt is doing well and is ready for discharge, will discharge in the AM.   Bipolar 1 disorder  -Contiue abilify  10mg  daily to target mood stabilization, racing thoughts, anxiety, paranoia and reported auditory hallucinations   The risks/benefits/side-effects/alternatives to this medication were discussed in detail with the patient and time was given for questions. The patient consents to medication trial. -- Metabolic profile and EKG monitoring obtained while on an atypical antipsychotic  -- Encouraged patient to participate in unit milieu and in scheduled group therapies  3. Medical Issues Being Addressed:  Elevated blood pressure reading- patient declined propranolol  being added to medication regimen at this time. Will continue to monitor.    4. Discharge Planning:  -- Discharge Friday  -- Social work and case management to assist with discharge planning and identification of hospital follow-up needs prior to discharge  -- Estimated LOS: 3-4 days

## 2023-11-18 NOTE — Group Note (Signed)
 Date:  11/18/2023 Time:  6:48 PM  Group Topic/Focus:  Wellness Toolbox:   The focus of this group is to discuss various aspects of wellness, balancing those aspects and exploring ways to increase the ability to experience wellness.  Patients will create a wellness toolbox for use upon discharge.    Participation Level:  Active  Participation Quality:  Appropriate  Affect:  Appropriate  Cognitive:  Appropriate  Insight: Appropriate  Engagement in Group:  Engaged  Modes of Intervention:  Activity and Socialization  Additional Comments:    Deitra Caron Mainland 11/18/2023, 6:48 PM

## 2023-11-19 DIAGNOSIS — F319 Bipolar disorder, unspecified: Secondary | ICD-10-CM | POA: Diagnosis not present

## 2023-11-19 NOTE — Plan of Care (Signed)
   Problem: Education: Goal: Emotional status will improve Outcome: Progressing Goal: Mental status will improve Outcome: Progressing

## 2023-11-19 NOTE — Progress Notes (Signed)
  [facility] Adult Case Management Discharge Plan :  Will you be returning to the same living situation after discharge:  Yes,  pt plans to return home upon discharge. At discharge, do you have transportation home?: Yes,  pt brother to provide transportation home. Do you have the ability to pay for your medications: Yes,  BLUE CROSS BLUE SHIELD / BCBS COMM PPO  Release of information consent forms completed and in the chart;  Patient's signature needed at discharge.  Patient to Follow up at:  Follow-up Information     [facility] Follow up.   Why: In person assessment for therapy and medication management is 11/22/23 at 11 AM. Contact information: [address] [place] [address] [phone]         [facility]. Go to.   Why: Virtual psychiatry appointment is 12/09/23 at 9:20 AM with Shellie Punch, MSN, PMHNP-BC.  Virtual therapy appointment is 11/24/23 at 1 PM with Powell Pap, LCSW. Contact information: [facility] [address] [place], [place] [address]  Office: [phone]                Next level of care provider has access to [facility] Link:no  Safety Planning and Suicide Prevention discussed: Yes,  SPE completed with pt as unable to establish contact with collateral.      Has patient been referred to the Quitline?: Patient does not use tobacco/nicotine products  Patient has been referred for addiction treatment: No known substance use disorder.  Nadara JONELLE Fam, LCSW 11/19/2023, 9:40 AM

## 2023-11-19 NOTE — Progress Notes (Signed)
 Discharge Note:  Patient denies SI/HI/AVH at this time. Discharge instructions, AVS, prescriptions, and transition record reviewed with patient. Patient agrees to comply with medication management, follow-up visit, and outpatient therapy. Patient belongings returned to patient. Patient questions and concerns addressed and answered. Patient ambulatory off unit @ 1245. Patient discharged to home with self care, escorted by dad.

## 2023-11-19 NOTE — Plan of Care (Signed)
  Problem: Education: Goal: Emotional status will improve Outcome: Adequate for Discharge   Problem: Education: Goal: Mental status will improve Outcome: Adequate for Discharge   Problem: Education: Goal: Verbalization of understanding the information provided will improve Outcome: Adequate for Discharge   Problem: Activity: Goal: Interest or engagement in activities will improve Outcome: Adequate for Discharge Goal: Sleeping patterns will improve Outcome: Adequate for Discharge   Problem: Safety: Goal: Periods of time without injury will increase Outcome: Adequate for Discharge

## 2023-11-19 NOTE — Progress Notes (Signed)
 [facility] MD Progress Note  11/19/2023 10:15 AM Louis Newton  MRN:  [id number]   Subjective:  Chart reviewed, case discussed in multidisciplinary meeting, patient seen during rounds.   9/26 On follow-up patient is alert and oriented.  They are pleasant and cooperative on exam.  They deny adverse effects of medications.  They deny SI, HI, and AVH.  They voiced no concerns or complaints at this time.  They demonstrate good insight  into the need for outpatient follow-up and medication compliance. They are able to discuss their support system, their desire to be there for their child. They also discuss their coping skills and are able to verbalize crisis resources. They report stable mood, appetite, and sleep. They were linear and logical on exam. Denied symptoms of mania and psychosis. Plan to discharge today. Gives permission to contact brother [name] for safety planning.    9/25: On follow-up patient is alert and oriented.  They are pleasant and cooperative on exam.  They deny adverse effects of medications.  They deny SI, HI, and AVH.  They voiced no concerns or complaints at this time.  They demonstrate good insight into the need for outpatient follow-up and medication compliance. They ask if their follow up can be switched to virtual. They are able to discuss their support system specifically citing their family who they live with. They are able to discuss crisis resources specifically calling 911, and all also notified about 988 and that they can go to any emergency room. They are able to discuss their coping mechanisms with a primary focus on exercise. They report stable mood, appetite, and sleep. They were linear and logical on exam. Denied symptoms of mania and psychosis.    9/24: On follow-up patient is alert and oriented.  They are pleasant and cooperative on exam.  They deny adverse effects of medications.  They deny SI, HI, and AVH.  They voiced no concerns or complaints at this time.  They  demonstrate good insight  into the need for outpatient follow-up and medication compliance. No behavioral PRNs overnight. Rates anxiety and depression 0/10. He is linear, logical, and future oriented on exam. Is able to discuss his social support, crisis resources, and coping skills. Feels mood and appetite are stable. States sleep is increased from baseline.    9/23: On follow-up patient is alert and oriented.  They are pleasant and cooperative on exam.  They deny adverse effects of medications.  They deny SI, HI, and AVH.  They voiced no concerns or complaints at this time.  They demonstrate good insight into the need for outpatient follow-up and medication compliance. No Behavioral PRNS over night.Denying paranoia today. Continue BP monitoring.   9/22: Patient seen for follow up states he got sick watching stuff on line and started having SI and could not relax. Notes he has decreased appetite. Had been off of medications for awhile because he had started to feel better. Notes that he felt groggy on meds so discontinued in April. Feels supported at home. Lives with mom and is employed. Reports slep is fair today. Appetite is stable. Is agreeable to outpatient follow up. No Behavioral PRNS over night. Denies SI/HI/AVH. Orientation was intact. Feels the paranoia is improving is uncertain what he was paranoid about. States sometimes his anxiety just makes him paranoid. Again discussed BP and anxiety, he declined further medication management. Denies adverse effects of medications, voices no concerns or complaints.   9/21 Today on exam, patient still appeared anxious and restless,  but reported some improvement in symptoms. We dicussed increasing abilify  to 10mg  for further symptom control to which patient was agreeable. He denied any noted side effects of the new medication. Patient was noted to have elevated blood pressure readings during his admission, we discussed potentially adding propranolol  for bp  and anxiety control, but patient declined and reported hydroxyzine  worked well for him last night and he wished to keep that the same at this time. Patient denied suicidal or homicidal thoughts. He denied visual hallucinations. When asked about auditory hallucinations that were reported last night, patient stated I think that was just the door slamming, actually. He denied hearing any voices. He did report feelings of paranoia, racing thoughts, feeling on edge, and being hypervigilant. He reported he is trying to interact more with peers on the unit to distract himself. He denied any physical complaints today.    Sleep: patient reported sleeping a little more last night  Appetite:  Fair  Past Psychiatric History: see h&P Family History:  Family History  Problem Relation Age of Onset   Kidney disease Father    Hypertension Brother    Epilepsy Brother    Heart disease Maternal Grandmother    Dementia Paternal Grandfather    Social History:  Social History   Substance and Sexual Activity  Alcohol Use Not Currently     Social History   Substance and Sexual Activity  Drug Use No    Social History   Socioeconomic History   Marital status: Single    Spouse name: Not on file   Number of children: 1   Years of education: Not on file   Highest education level: High school graduate  Occupational History   Not on file  Tobacco Use   Smoking status: Never    Passive exposure: Never   Smokeless tobacco: Never  Vaping Use   Vaping status: Never Used  Substance and Sexual Activity   Alcohol use: Not Currently   Drug use: No   Sexual activity: Yes    Birth control/protection: None  Other Topics Concern   Not on file  Social History Narrative   1 step daughter    Social Drivers of Corporate investment banker Strain: Not on file  Food Insecurity: No Food Insecurity (11/12/2023)   Hunger Vital Sign    Worried About Running Out of Food in the Last Year: Never true    Ran Out  of Food in the Last Year: Never true  Transportation Needs: No Transportation Needs (11/12/2023)   PRAPARE - Administrator, Civil Service (Medical): No    Lack of Transportation (Non-Medical): No  Physical Activity: Not on file  Stress: Not on file  Social Connections: Not on file   Past Medical History:  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 No pertinent surgical history.  Current Medications: Current Facility-Administered Medications  Medication Dose Route Frequency Provider Last Rate Last Admin   acetaminophen  (TYLENOL ) tablet 650 mg  650 mg Oral Q6H PRN Smith, Annie B, NP       alum & mag hydroxide-simeth (MAALOX/MYLANTA) 200-200-20 MG/5ML suspension 30 mL  30 mL Oral Q4H PRN Smith, Annie B, NP   30 mL at 11/15/23 1034   ARIPiprazole  (ABILIFY ) tablet 10 mg  10 mg Oral Daily Claudene Sham  B, NP   10 mg at 11/19/23 9182   haloperidol  (HALDOL ) tablet 5 mg  5 mg Oral TID PRN Smith, Annie B, NP       And   diphenhydrAMINE  (BENADRYL ) capsule 50 mg  50 mg Oral TID PRN Smith, Annie B, NP       haloperidol  lactate (HALDOL ) injection 5 mg  5 mg Intramuscular TID PRN Smith, Annie B, NP       And   diphenhydrAMINE  (BENADRYL ) injection 50 mg  50 mg Intramuscular TID PRN Smith, Annie B, NP       And   LORazepam  (ATIVAN ) injection 2 mg  2 mg Intramuscular TID PRN Smith, Annie B, NP       haloperidol  lactate (HALDOL ) injection 10 mg  10 mg Intramuscular TID PRN Smith, Annie B, NP       And   diphenhydrAMINE  (BENADRYL ) injection 50 mg  50 mg Intramuscular TID PRN Smith, Annie B, NP       And   LORazepam  (ATIVAN ) injection 2 mg  2 mg Intramuscular TID PRN Smith, Annie B, NP       hydrOXYzine  (ATARAX ) tablet 25 mg  25 mg Oral TID PRN Smith, Annie B, NP   25 mg at 11/13/23 2353    magnesium  hydroxide (MILK OF MAGNESIA) suspension 30 mL  30 mL Oral Daily PRN Smith, Annie B, NP       traZODone  (DESYREL ) tablet 50 mg  50 mg Oral QHS PRN Smith, Annie B, NP   50 mg at 11/16/23 2109    Lab Results:  No results found for this or any previous visit (from the past 48 hours).   Blood Alcohol level:  Lab Results  Component Value Date   [facility] <15 11/12/2023   ETH <10 05/07/2023    Metabolic Disorder Labs: Lab Results  Component Value Date   HGBA1C 4.9 11/14/2023   MPG 93.93 11/14/2023   MPG 102.54 05/11/2023   Lab Results  Component Value Date   PROLACTIN 18.4 (H) 09/11/2015   Lab Results  Component Value Date   CHOL 171 11/14/2023   TRIG 73 11/14/2023   HDL 48 11/14/2023   CHOLHDL 3.6 11/14/2023   VLDL 15 11/14/2023   LDLCALC 108 (H) 11/14/2023   LDLCALC 93 05/17/2023    Physical Findings: AIMS:  , ,  ,  ,    CIWA:    COWS:      Psychiatric Specialty Exam:  Presentation  General Appearance:  Casual  Eye Contact: Fair  Speech: Clear and Coherent  Speech Volume: Normal    Mood and Affect  Mood: Euthymic  Affect: Congruent   Thought Process  Thought Processes: Coherent  Descriptions of Associations:Intact  Orientation:Full (Time, Place and Person)  Thought Content:Paranoid Ideation  Hallucinations:Hallucinations: None   Ideas of Reference:Paranoia  Suicidal Thoughts:Suicidal Thoughts: No   Homicidal Thoughts:Homicidal Thoughts: No    Sensorium  Memory: Immediate Fair; Recent Fair  Judgment: Good  Insight: Good   Executive Functions  Concentration: Poor  Attention Span: Poor  Recall: Poor  Fund of Knowledge: Fair  Language: Fair   Psychomotor Activity  Psychomotor Activity:Psychomotor Activity: Normal  Musculoskeletal: Strength & Muscle Tone: within normal limits Gait & Station: normal Assets  Assets: Manufacturing systems engineer; Desire for Improvement    Physical Exam: Physical  Exam HENT:     Head: Normocephalic.  Neurological:     Mental Status: He is alert and oriented to person, place, and time.  Psychiatric:  Mood and Affect: Mood normal.        Behavior: Behavior normal.        Thought Content: Thought content normal.        Judgment: Judgment normal.    Review of Systems  Constitutional:  Negative for fever.  Respiratory:  Negative for sputum production.   Cardiovascular:  Negative for chest pain.  Gastrointestinal:  Negative for diarrhea, nausea and vomiting.  Neurological:  Negative for headaches.  Psychiatric/Behavioral:  Negative for depression, hallucinations, substance abuse and suicidal ideas. The patient is not nervous/anxious and does not have insomnia.    Blood pressure 105/80, pulse 77, temperature 97.9 F (36.6 C), resp. rate 18, height 5' 9 (1.753 m), weight 68 kg, SpO2 100%. Body mass index is 22.15 kg/m.  Diagnosis: Principal Problem:   Bipolar 1 disorder (HCC)   PLAN: Safety and Monitoring:  -- Voluntary admission to inpatient psychiatric unit for safety, stabilization and treatment  -- Daily contact with patient to assess and evaluate symptoms and progress in treatment  -- Patient's case to be discussed in multi-disciplinary team meeting  -- Observation Level : q15 minute checks  -- Vital signs:  q12 hours  -- Precautions: suicide, elopement, and assault -- Encouraged patient to participate in unit milieu and in scheduled group therapies  2. Psychiatric Diagnoses and Treatment:  D/c today   Bipolar 1 disorder  -Contiue abilify  10mg  daily to target mood stabilization, racing thoughts, anxiety, paranoia and reported auditory hallucinations   The risks/benefits/side-effects/alternatives to this medication were discussed in detail with the patient and time was given for questions. The patient consents to medication trial. -- Metabolic profile and EKG monitoring obtained while on an atypical antipsychotic  -- Encouraged  patient to participate in unit milieu and in scheduled group therapies  3. Medical Issues Being Addressed:  Elevated blood pressure reading- patient declined propranolol  being added to medication regimen at this time. Will continue to monitor.    4. Discharge Planning:  -- Discharge today  -- Social work and case management to assist with discharge planning and identification of hospital follow-up needs prior to discharge  -- Estimated LOS: 3-4 days

## 2023-11-19 NOTE — BHH Suicide Risk Assessment (Signed)
 [facility] Discharge Suicide Risk Assessment   Principal Problem: Bipolar 1 disorder (HCC) Discharge Diagnoses: Principal Problem:   Bipolar 1 disorder (HCC)   Total Time spent with patient: 30 minutes  Musculoskeletal: Strength & Muscle Tone: within normal limits Gait & Station: normal Patient leans: N/A  Psychiatric Specialty Exam  Presentation  General Appearance:  Casual  Eye Contact: Fair  Speech: Clear and Coherent  Speech Volume: Normal  Handedness: Right   Mood and Affect  Mood: Euthymic  Duration of Depression Symptoms: Less than two weeks  Affect: Congruent   Thought Process  Thought Processes: Coherent  Descriptions of Associations:Intact  Orientation:Full (Time, Place and Person)  Thought Content:Logical  History of Schizophrenia/Schizoaffective disorder:No  Duration of Psychotic Symptoms:Greater than six months  Hallucinations:Hallucinations: None  Ideas of Reference:Paranoia  Suicidal Thoughts:Suicidal Thoughts: No  Homicidal Thoughts:Homicidal Thoughts: No   Sensorium  Memory: Immediate Fair; Recent Fair  Judgment: Good  Insight: Good   Executive Functions  Concentration: Poor  Attention Span: Poor  Recall: Poor  Fund of Knowledge: Fair  Language: Fair   Psychomotor Activity  Psychomotor Activity: Psychomotor Activity: Normal   Assets  Assets: Communication Skills; Desire for Improvement   Sleep  Sleep: Sleep: Good  Estimated Sleeping Duration (Last 24 Hours): 7.50-8.00 hours  Physical Exam: Physical Exam Vitals and nursing note reviewed.  HENT:     Head: Atraumatic.  Eyes:     Extraocular Movements: Extraocular movements intact.  Pulmonary:     Effort: Pulmonary effort is normal.  Neurological:     Mental Status: He is alert and oriented to person, place, and time.    Review of Systems  Psychiatric/Behavioral:  Negative for depression, hallucinations, memory loss, substance abuse and  suicidal ideas. The patient is nervous/anxious. The patient does not have insomnia.    Blood pressure 105/80, pulse 77, temperature 97.9 F (36.6 C), resp. rate 18, height 5' 9 (1.753 m), weight 68 kg, SpO2 100%. Body mass index is 22.15 kg/m.  Mental Status Per Nursing Assessment::   On Admission:  NA  Demographic Factors:  Male  Loss Factors: NA  Historical Factors: Impulsivity  Risk Reduction Factors:   Sense of responsibility to family, Living with another person, especially a relative, Positive social support, and Positive coping skills or problem solving skills  Continued Clinical Symptoms:  Previous Psychiatric Diagnoses and Treatments  Cognitive Features That Contribute To Risk:  None    Suicide Risk:  Minimal: No identifiable suicidal ideation.  Patients presenting with no risk factors but with morbid ruminations; may be classified as minimal risk based on the severity of the depressive symptoms   Follow-up Information     [facility] Follow up.   Why: In person assessment for therapy and medication management is 11/22/23 at 11 AM. Contact information: [address] [place] [address] [phone]         [facility]. Go to.   Why: Virtual psychiatry appointment is 12/09/23 at 9:20 AM with Shellie Punch, MSN, PMHNP-BC.  Virtual therapy appointment is 11/24/23 at 1 PM with Powell Pap, LCSW. Contact information: [facility] [address] [place], [place] [address]  Office: [phone]                Plan Of Care/Follow-up recommendations:  # It is recommended to the patient to continue psychiatric medications as prescribed, after discharge from the hospital.   # It is recommended to the patient to follow up with your outpatient psychiatric  provider and PCP. # It was discussed with the patient, the impact of alcohol, drugs, tobacco have been there overall psychiatric and medical wellbeing, and  total abstinence from substance use was recommended. # Prescriptions provided or sent directly to preferred pharmacy at discharge. Patient agreeable to plan. Given the opportunity to ask questions. Appears to feel comfortable with discharge.  # In the event of worsening symptoms, the patient is instructed to call the crisis hotline (988), 911 and or go to the nearest ED for appropriate evaluation and treatment of symptoms. To follow-up with primary care provider for other medical issues, concerns and or health care needs # Patient was discharged home as requested with a plan to follow up as noted above.    Donnice FORBES Right, PA-C 11/19/2023, 11:26 AM

## 2023-11-19 NOTE — Progress Notes (Signed)
   11/19/23 1050  Psych Admission Type (Psych Patients Only)  Admission Status Voluntary  Psychosocial Assessment  Patient Complaints None  Eye Contact Brief  Facial Expression Flat  Affect Sad  Speech Logical/coherent  Interaction Minimal  Motor Activity Restless  Appearance/Hygiene Unremarkable  Behavior Characteristics Cooperative;Appropriate to situation  Mood Sad;Pleasant  Thought Process  Coherency WDL  Content WDL  Delusions None reported or observed  Perception WDL  Hallucination None reported or observed  Judgment WDL  Confusion None  Danger to Self  Current suicidal ideation? Denies  Agreement Not to Harm Self Yes  Description of Agreement  (verbal)

## 2023-11-19 NOTE — BHH Counselor (Addendum)
 ADDENDUM CSW located another number for mother and attempted to call it.  Phone rang and went to voicemail.  Voicemail was set up but was full.  CSW was unable to leave a voicemail.  CSW spoke with the patient's brother, Alden, [phone].  He reports no concerns for patient being discharged.  He wanted information on pts appointment and medications and CSW provided information within scope. CSW reminded that this information will be available via the pts discharge paperwork.   Family reports no concerns at this time.    Sherryle Margo, MSW, LCSW 11/19/2023 10:40 AM   CSW attempted again to complete SPE with the patient's collateral.  Call was not answered and voicemail is not set up.   Sherryle Margo, MSW, LCSW 11/19/2023 9:29 AM

## 2023-11-19 NOTE — Discharge Summary (Signed)
 Physician Discharge Summary Note  Patient:  Louis Newton is an 38 y.o., male MRN:  [id number] DOB:  22-Apr-1985 Patient phone:  [phone] (home)  Patient address:   [address] [place] [address],   Total time spent: 40 min Date of Admission:  11/12/2023 Date of Discharge: 11/19/23  Reason for Admission:  Patient is a 38 year old male with history of bipolar I disorder admitted for stabilization after presenting with anxiety, paranoia, and functional decline at work, in the setting of recent medication nonadherence and family concern for safety.  Principal Problem: Bipolar 1 disorder (HCC) Discharge Diagnoses: Principal Problem:   Bipolar 1 disorder (HCC)   Past Psychiatric History: See H&P  Family Psychiatric  History: See H&P Social History:  Social History   Substance and Sexual Activity  Alcohol Use Not Currently     Social History   Substance and Sexual Activity  Drug Use No    Social History   Socioeconomic History   Marital status: Single    Spouse name: Not on file   Number of children: 1   Years of education: Not on file   Highest education level: High school graduate  Occupational History   Not on file  Tobacco Use   Smoking status: Never    Passive exposure: Never   Smokeless tobacco: Never  Vaping Use   Vaping status: Never Used  Substance and Sexual Activity   Alcohol use: Not Currently   Drug use: No   Sexual activity: Yes    Birth control/protection: None  Other Topics Concern   Not on file  Social History Narrative   1 step daughter    Social Drivers of Corporate investment banker Strain: Not on file  Food Insecurity: No Food Insecurity (11/12/2023)   Hunger Vital Sign    Worried About Running Out of Food in the Last Year: Never true    Ran Out of Food in the Last Year: Never true  Transportation Needs: No Transportation Needs (11/12/2023)   PRAPARE - Administrator, Civil Service (Medical): No    Lack of  Transportation (Non-Medical): No  Physical Activity: Not on file  Stress: Not on file  Social Connections: Not on file   Past Medical History:  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 No pertinent surgical history. Family History:  Family History  Problem Relation Age of Onset   Kidney disease Father    Hypertension Brother    Epilepsy Brother    Heart disease Maternal Grandmother    Dementia Paternal Grandfather     Hospital Course:    Patient was  admitted to the inpatient psychiatric unit for stabilization and treatment of worsening anxiety, paranoia, and functional impairment in the context of a history of bipolar I disorder. On admission, patient was anxious, restless, and displayed pressured speech, poor sleep, and heightened vigilance, though he remained cooperative during evaluation. He denied suicidal or homicidal ideation at that time but endorsed intermittent auditory perceptions such as hearing a "gun shot," which he later clarified may have been environmental noise. Family collateral revealed a pattern of medication nonadherence, progressive paranoia, and impaired functioning at work, including confusion and inability to perform job duties, prompting concern for decompensation.  Following admission, patient was placed on q15 minute safety checks with  suicide, elopement, and assault precautions in place. He was observed closely by nursing staff and assessed daily by the psychiatric team. Aripiprazole  5 mg daily was initiated for mood stabilization and psychotic symptoms, with trazodone  available as needed for sleep and hydroxyzine  prescribed as needed for anxiety. Patient reported prior intolerable sedation on risperidone  and Depakote  and was agreeable to trialing aripiprazole  at  a low dose to monitor tolerability. He tolerated the medication without adverse effects, and the dose was titrated to 10 mg daily during hospitalization to optimize symptom control. EKG monitoring was obtained. Patient was educated extensively on risks, benefits, and potential side effects of antipsychotic therapy and voiced understanding and consent.  Early in admission, patient reported ongoing anxiety, paranoia, and racing thoughts, though he engaged with peers on the unit and used milieu activities as distraction. He was hypervigilant and endorsed occasional feelings of being "on edge," though he consistently denied suicidal or homicidal ideation and gradually denied hallucinations as symptoms improved. He demonstrated improved sleep quality with intermittent use of trazodone  and reported good effect from hydroxyzine  for anxiety. He required no behavioral PRN medications during his admission and remained in good behavioral control.  By mid-admission, patient demonstrated progressive improvement in mood stability and organization of thought. He was linear and logical on exam, able to reflect on his stressors, and able to verbalize appropriate coping mechanisms such as exercise and use of social support. He denied paranoia, hallucinations, or manic symptoms and was increasingly able to articulate insight into the need for outpatient follow-up and adherence to prescribed medication. His appetite and sleep stabilized, and he reported feeling supported at home.  Blood pressure was monitored throughout hospitalization and remained intermittently elevated, with the highest recorded at 151/97. Patient denied chest pain, shortness of breath, or other associated symptoms. He declined medication management and indicated he would make lifestyle changes and verbalized understanding of the need to follow up with a primary care provider.   In the days leading up to discharge, patient consistently denied suicidal  ideation, homicidal ideation, or auditory/visual hallucinations. He was pleasant, cooperative, and future oriented, reporting stable mood, adequate sleep, and normalized appetite. He engaged in discharge planning with the treatment team, identified his support system, and acknowledged the importance of crisis resources including 911, 988, and presenting to the emergency room if decompensation occurs. Safety planning and suicide prevention education were completed with both the patient and his brother, who expressed no safety concerns and confirmed transportation home at discharge.  Spoke with brother [name] on the day of discharge who was agreeable with plan, had no safety concerns, and agreed to pick pt up.   At the time of discharge, patient was alert, oriented, and cooperative, with improved affect, logical thought process, and good insight. He verbalized commitment to medication adherence, outpatient follow-up, and utilization of coping strategies.   Detailed risk assessment is complete based on clinical exam and individual risk factors and acute suicide risk is low and acute violence risk is low. Currently, all modifiable risk of harm to self/harm to others have been addressed and patient is no longer appropriate for the acute inpatient setting and is able to continue treatment for mental health needs in the community with the supports as indicated below. Patient is educated and verbalized understanding of discharge plan of care including medications, follow-up appointments, mental health resources and further crisis services in the community. He is instructed to call 911 or present to the nearest emergency room should he experience any decompensation in mood,  or return of suicidal/homicidal ideations. Patient verbalizes understanding of this education and agrees to this plan of care  Physical Findings: AIMS:  , ,  ,  ,    CIWA:    COWS:        Psychiatric Specialty Exam:  Presentation   General Appearance:  Casual  Eye Contact: Fair  Speech: Clear and Coherent  Speech Volume: Normal    Mood and Affect  Mood: Euthymic  Affect: Congruent   Thought Process  Thought Processes: Coherent  Descriptions of Associations:Intact  Orientation:Full (Time, Place and Person)  Thought Content:Logical  Hallucinations:Hallucinations: None  Ideas of Reference:Paranoia  Suicidal Thoughts:Suicidal Thoughts: No  Homicidal Thoughts:Homicidal Thoughts: No   Sensorium  Memory: Immediate Fair; Recent Fair  Judgment: Good  Insight: Good   Executive Functions  Concentration: Poor  Attention Span: Poor  Recall: Poor  Fund of Knowledge: Fair  Language: Fair   Psychomotor Activity  Psychomotor Activity: Psychomotor Activity: Normal  Musculoskeletal: Strength & Muscle Tone: within normal limits Gait & Station: normal Assets  Assets: Manufacturing systems engineer; Desire for Improvement   Sleep  Sleep: Sleep: Good    Physical Exam: Physical Exam Vitals and nursing note reviewed.  HENT:     Head: Atraumatic.  Eyes:     Extraocular Movements: Extraocular movements intact.  Pulmonary:     Effort: Pulmonary effort is normal.  Neurological:     Mental Status: He is alert and oriented to person, place, and time.  Psychiatric:        Mood and Affect: Mood normal.        Behavior: Behavior normal.        Thought Content: Thought content normal.        Judgment: Judgment normal.    Review of Systems  Psychiatric/Behavioral:  Negative for hallucinations, substance abuse and suicidal ideas. The patient does not have insomnia.    Blood pressure 105/80, pulse 77, temperature 97.9 F (36.6 C), resp. rate 18, height 5' 9 (1.753 m), weight 68 kg, SpO2 100%. Body mass index is 22.15 kg/m.   Social History   Tobacco Use  Smoking Status Never   Passive exposure: Never  Smokeless Tobacco Never   Tobacco Cessation:  N/A, patient does not  currently use tobacco products   Blood Alcohol level:  Lab Results  Component Value Date   [facility] <15 11/12/2023   ETH <10 05/07/2023    Metabolic Disorder Labs:  Lab Results  Component Value Date   HGBA1C 4.9 11/14/2023   MPG 93.93 11/14/2023   MPG 102.54 05/11/2023   Lab Results  Component Value Date   PROLACTIN 18.4 (H) 09/11/2015   Lab Results  Component Value Date   CHOL 171 11/14/2023   TRIG 73 11/14/2023   HDL 48 11/14/2023   CHOLHDL 3.6 11/14/2023   VLDL 15 11/14/2023   LDLCALC 108 (H) 11/14/2023   LDLCALC 93 05/17/2023    See Psychiatric Specialty Exam and Suicide Risk Assessment completed by Attending Physician prior to discharge.  Discharge destination:  Home  Is patient on multiple antipsychotic therapies at discharge:  No   Has Patient had three or more failed trials of antipsychotic monotherapy by history:  No  Recommended Plan for Multiple Antipsychotic Therapies: NA   Allergies as of 11/19/2023       Reactions   Bee Venom Swelling   THROAT SWELLING        Medication List     STOP taking these medications    FLUoxetine  10  MG capsule Commonly known as: PROZAC        TAKE these medications      Indication  ARIPiprazole  10 MG tablet Commonly known as: ABILIFY  Take 1 tablet (10 mg total) by mouth daily.  Indication: bipolar disorder   traZODone  50 MG tablet Commonly known as: DESYREL  Take 1-2 tablets (50-100 mg total) by mouth at bedtime as needed for sleep.  Indication: Major Depressive Disorder        Follow-up Information     [facility] Follow up.   Why: In person assessment for therapy and medication management is 11/22/23 at 11 AM. Contact information: [address] [place] [address] [phone]         [facility]. Go to.   Why: Virtual psychiatry appointment is 12/09/23 at 9:20 AM with Shellie Punch, MSN, PMHNP-BC.  Virtual therapy appointment is 11/24/23 at 1 PM with Powell Pap, LCSW. Contact information: [facility] [address] [place], [place] [address]  Office: [phone]                Follow-up recommendations:   # It is recommended to the patient to continue psychiatric medications as prescribed, after discharge from the hospital.   # It is recommended to the patient to follow up with your outpatient psychiatric provider and PCP. # It was discussed with the patient, the impact of alcohol, drugs, tobacco have been there overall psychiatric and medical wellbeing, and total abstinence from substance use was recommended. # Prescriptions provided or sent directly to preferred pharmacy at discharge. Patient agreeable to plan. Given the opportunity to ask questions. Appears to feel comfortable with discharge.  # In the event of worsening symptoms, the patient is instructed to call the crisis hotline (988), 911 and or go to the nearest ED for appropriate evaluation and treatment of symptoms. To follow-up with primary care provider for other medical issues, concerns and or health care needs # Patient was discharged home as requested with a plan to follow up as noted above.      Signed: Donnice FORBES Right, PA-C 11/19/2023, 11:29 AM

## 2023-11-19 NOTE — Group Note (Signed)
 Recreation Therapy Group Note   Group Topic:Health and Wellness  Group Date: 11/19/2023 Start Time: 1020 End Time: 1115 Facilitators: Celestia Jeoffrey BRAVO, LRT, CTRS Location: Courtyard  Group Description: [employer]. LRT and patients played games of basketball, drew with chalk, and played corn hole while outside in the courtyard while getting fresh air and sunlight. Music was being played in the background. LRT and peers conversed about different games they have played before, what they do in their free time and anything else that is on their minds. LRT encouraged pts to drink water after being outside, sweating and getting their heart rate up.  Goal Area(s) Addressed: Patient will build on frustration tolerance skills. Patients will partake in a competitive play game with peers. Patients will gain knowledge of new leisure interest/hobby.     Affect/Mood: Appropriate   Participation Level: Active   Participation Quality: Independent   Behavior: Appropriate   Speech/Thought Process: Coherent   Insight: Good   Judgement: Good   Modes of Intervention: Activity   Patient Response to Interventions:  Receptive   Education Outcome:  Acknowledges education   Clinical Observations/Individualized Feedback: Louis Newton was active in their participation of session activities and group discussion. Pt interacted well with LRT and peers duration of session.    Plan: Continue to engage patient in RT group sessions 2-3x/week.   Jeoffrey BRAVO Celestia, LRT, CTRS 11/19/2023 11:49 AM

## 2023-11-25 ENCOUNTER — Encounter: Payer: Self-pay | Admitting: Family Medicine

## 2023-11-25 ENCOUNTER — Ambulatory Visit (INDEPENDENT_AMBULATORY_CARE_PROVIDER_SITE_OTHER): Admitting: Family Medicine

## 2023-11-25 VITALS — BP 125/73 | HR 75 | Resp 16 | Ht 69.0 in | Wt 162.0 lb

## 2023-11-25 DIAGNOSIS — F5104 Psychophysiologic insomnia: Secondary | ICD-10-CM | POA: Diagnosis not present

## 2023-11-25 DIAGNOSIS — F319 Bipolar disorder, unspecified: Secondary | ICD-10-CM | POA: Diagnosis not present

## 2023-11-25 DIAGNOSIS — E782 Mixed hyperlipidemia: Secondary | ICD-10-CM
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
## 2024-01-05 NOTE — Patient Instructions (Signed)

## 2024-01-24 NOTE — Addendum Note (Signed)
 Addended by: TOWANA SMALL on: 01/24/2024 11:50 AM   Modules accepted: Level of Service

## 2024-06-27 ENCOUNTER — Ambulatory Visit

## 2024-07-04 ENCOUNTER — Encounter: Admitting: Family Medicine
